# Patient Record
Sex: Female | Born: 1950 | Race: Black or African American | Hispanic: No | Marital: Married | Smoking: Never smoker
Health system: Southern US, Community
[De-identification: ages and names within clinical notes are randomized; demographics above are authoritative.]

## PROBLEM LIST (undated history)

## (undated) DIAGNOSIS — I10 Essential (primary) hypertension: Secondary | ICD-10-CM

---

## 1999-09-05 ENCOUNTER — Emergency Department (HOSPITAL_COMMUNITY): Admission: EM | Admit: 1999-09-05 | Discharge: 1999-09-05 | Payer: Self-pay | Admitting: Emergency Medicine

## 1999-09-05 ENCOUNTER — Encounter: Payer: Self-pay | Admitting: Emergency Medicine

## 2016-09-05 ENCOUNTER — Ambulatory Visit (INDEPENDENT_AMBULATORY_CARE_PROVIDER_SITE_OTHER): Payer: Self-pay | Admitting: Physician Assistant

## 2016-09-05 VITALS — BP 150/82 | HR 72 | Temp 99.8°F | Resp 17 | Ht 65.0 in | Wt 195.0 lb

## 2016-09-05 DIAGNOSIS — Z13220 Encounter for screening for lipoid disorders: Secondary | ICD-10-CM

## 2016-09-05 DIAGNOSIS — I10 Essential (primary) hypertension: Secondary | ICD-10-CM

## 2016-09-05 LAB — POCT CBC
Granulocyte percent: 45.5 %G (ref 37–80)
HCT, POC: 35.5 % — AB (ref 37.7–47.9)
Hemoglobin: 12.6 g/dL (ref 12.2–16.2)
Lymph, poc: 3.3 (ref 0.6–3.4)
MCH, POC: 32.1 pg — AB (ref 27–31.2)
MCHC: 35.5 g/dL — AB (ref 31.8–35.4)
MCV: 90.4 fL (ref 80–97)
MID (cbc): 0.4 (ref 0–0.9)
MPV: 7.9 fL (ref 0–99.8)
POC Granulocyte: 3.1 (ref 2–6.9)
POC LYMPH PERCENT: 48.7 %L (ref 10–50)
POC MID %: 5.8 %M (ref 0–12)
Platelet Count, POC: 199 10*3/uL (ref 142–424)
RBC: 3.93 M/uL — AB (ref 4.04–5.48)
RDW, POC: 13.8 %
WBC: 6.8 10*3/uL (ref 4.6–10.2)

## 2016-09-05 LAB — COMPLETE METABOLIC PANEL WITH GFR
ALT: 34 U/L — ABNORMAL HIGH (ref 6–29)
AST: 35 U/L (ref 10–35)
Albumin: 4.1 g/dL (ref 3.6–5.1)
Alkaline Phosphatase: 56 U/L (ref 33–130)
BUN: 14 mg/dL (ref 7–25)
CO2: 31 mmol/L (ref 20–31)
Calcium: 9.4 mg/dL (ref 8.6–10.4)
Chloride: 103 mmol/L (ref 98–110)
Creat: 0.93 mg/dL (ref 0.50–0.99)
GFR, Est African American: 74 mL/min (ref >=60)
GFR, Est Non African American: 64 mL/min (ref >=60)
Glucose, Bld: 97 mg/dL (ref 65–99)
Potassium: 3.3 mmol/L — ABNORMAL LOW (ref 3.5–5.3)
Sodium: 141 mmol/L (ref 135–146)
Total Bilirubin: 0.5 mg/dL (ref 0.2–1.2)
Total Protein: 7.2 g/dL (ref 6.1–8.1)

## 2016-09-05 LAB — LIPID PANEL
Cholesterol: 200 mg/dL — ABNORMAL HIGH (ref <200)
HDL: 51 mg/dL (ref >50)
LDL Cholesterol: 136 mg/dL — ABNORMAL HIGH (ref <100)
Total CHOL/HDL Ratio: 3.9 Ratio (ref <5.0)
Triglycerides: 63 mg/dL (ref <150)
VLDL: 13 mg/dL (ref <30)

## 2016-09-05 LAB — TSH: TSH: 3.37 mIU/L

## 2016-09-05 MED ORDER — LISINOPRIL-HYDROCHLOROTHIAZIDE 20-12.5 MG PO TABS: 2.0000 | ORAL_TABLET | Freq: Every day | ORAL | 2 refills | Status: DC

## 2016-09-05 MED ORDER — ATENOLOL 50 MG PO TABS: 50.0000 mg | ORAL_TABLET | Freq: Every day | ORAL | 2 refills | Status: DC

## 2016-09-05 NOTE — Patient Instructions (Signed)
     IF you received an x-ray today, you will receive an invoice from [facility]. Please contact [facility] at [phone] with questions or concerns regarding your invoice.   IF you received labwork today, you will receive an invoice from [facility]. Please contact [facility] at [phone] with questions or concerns regarding your invoice.   Our billing staff will not be able to assist you with questions regarding bills from these companies.  You will be contacted with the lab results as soon as they are available. The fastest way to get your results is to activate your My Chart account. Instructions are located on the last page of this paperwork. If you have not heard from us regarding the results in 2 weeks, please contact this office.      

## 2016-09-05 NOTE — Progress Notes (Signed)
09/05/2016 5:39 PM   DOB: [birth year] / MRN: [id number]  SUBJECTIVE:  Darlene [name] is a 65 y.o. female presenting for BP medication.  Reports she has run out of her Lisinopril and reports her PCP, Dr. Jimmye Norman, is no longer seeing patients.    She reports to me today that "my kidney is slowing down" per some lab results that she received at the weight loss clinic.    She is taking Atenolol now for about 1-2 years for HTN along with Prinzide.   She is not a diabetic, denies a history of CAD and stroke. She has been taking Niacin at the recommendation of Dr. Jimmye Norman and she is amenable to stopping.    She has no allergies on file.   She  has no past medical history on file.    She  reports that she has never smoked. She does not have any smokeless tobacco history on file. She  has no sexual activity history on file. The patient  has no past surgical history on file.  Her family history is not on file.  Review of Systems  Respiratory: Negative for cough.   Cardiovascular: Negative for chest pain and leg swelling.  Skin: Negative for rash.  Neurological: Negative for dizziness and headaches.    The problem list and medications were reviewed and updated by myself where necessary and exist elsewhere in the encounter.   OBJECTIVE:  BP (!) 150/82 (BP Location: Right Arm, Patient Position: Sitting, Cuff Size: Large)   Pulse 72   Temp 99.8 F (37.7 C) (Oral)   Resp 17   Ht 5\' 5"  (1.651 m)   Wt 195 lb (88.5 kg)   SpO2 90%   BMI 32.45 kg/m   Physical Exam  Constitutional: She is oriented to person, place, and time. She appears well-nourished. No distress.  Eyes: EOM are normal. Pupils are equal, round, and reactive to light.  Cardiovascular: Normal rate and regular rhythm.   Pulmonary/Chest: Effort normal and breath sounds normal.  Abdominal: She exhibits no distension.  Neurological: She is alert and oriented to person, place, and time. No cranial nerve deficit. Gait normal.    Skin: Skin is dry. She is not diaphoretic.  Psychiatric: She has a normal mood and affect.  Vitals reviewed.   Results for orders placed or performed in visit on 09/05/16 (from the past 72 hour(s))  POCT CBC     Status: Abnormal   Collection Time: 09/05/16  5:35 PM  Result Value Ref Range   WBC 6.8 4.6 - 10.2 K/uL   Lymph, poc 3.3 0.6 - 3.4   POC LYMPH PERCENT 48.7 10 - 50 %L   MID (cbc) 0.4 0 - 0.9   POC MID % 5.8 0 - 12 %M   POC Granulocyte 3.1 2 - 6.9   Granulocyte percent 45.5 37 - 80 %G   RBC 3.93 (A) 4.04 - 5.48 M/uL   Hemoglobin 12.6 12.2 - 16.2 g/dL   HCT, POC 35.5 (A) 37.7 - 47.9 %   MCV 90.4 80 - 97 fL   MCH, POC 32.1 (A) 27 - 31.2 pg   MCHC 35.5 (A) 31.8 - 35.4 g/dL   RDW, POC 13.8 %   Platelet Count, POC 199 142 - 424 K/uL   MPV 7.9 0 - 99.8 fL    No results found.  ASSESSMENT AND PLAN  Izell Breda was seen today for medication refill.  Diagnoses and all orders for this visit:  Essential hypertension Comments: Well  appearing female here today for med refills and to establish. I am stopping her Niacin due to risk of stroke. Will quantify her lipids. PE with me in June. Orders: -     lisinopril-hydrochlorothiazide (ZESTORETIC) 20-12.5 MG tablet; Take 2 tablets by mouth daily. -     atenolol (TENORMIN) 50 MG tablet; Take 1 tablet (50 mg total) by mouth daily. -     POCT CBC -     TSH -     COMPLETE METABOLIC PANEL WITH GFR  Screening for lipid disorders -     Lipid panel    The patient is advised to call or return to clinic if she does not see an improvement in symptoms, or to seek the care of the closest emergency department if she worsens with the above plan.   Philis Fendt, MHS, PA-C [facility] 09/05/2016 5:39 PM

## 2016-09-16 ENCOUNTER — Encounter: Payer: Self-pay | Admitting: Physician Assistant

## 2016-10-06 ENCOUNTER — Encounter: Payer: Self-pay | Admitting: Physician Assistant

## 2017-03-10 ENCOUNTER — Encounter: Payer: Self-pay | Admitting: Physician Assistant

## 2017-03-10 ENCOUNTER — Ambulatory Visit (INDEPENDENT_AMBULATORY_CARE_PROVIDER_SITE_OTHER): Payer: Medicare Other | Admitting: Physician Assistant

## 2017-03-10 ENCOUNTER — Ambulatory Visit (INDEPENDENT_AMBULATORY_CARE_PROVIDER_SITE_OTHER): Payer: Medicare Other

## 2017-03-10 VITALS — BP 155/85 | HR 102 | Temp 98.8°F | Resp 17 | Ht 67.5 in | Wt 185.0 lb

## 2017-03-10 DIAGNOSIS — Z131 Encounter for screening for diabetes mellitus: Secondary | ICD-10-CM

## 2017-03-10 DIAGNOSIS — I10 Essential (primary) hypertension: Secondary | ICD-10-CM | POA: Diagnosis not present

## 2017-03-10 DIAGNOSIS — M5442 Lumbago with sciatica, left side: Secondary | ICD-10-CM

## 2017-03-10 DIAGNOSIS — M47816 Spondylosis without myelopathy or radiculopathy, lumbar region: Secondary | ICD-10-CM | POA: Diagnosis not present

## 2017-03-10 LAB — POCT GLYCOSYLATED HEMOGLOBIN (HGB A1C): HEMOGLOBIN A1C: 5.7

## 2017-03-10 IMAGING — DX DG LUMBAR SPINE 2-3V
3 series · 3 of 3 positions shown · non-contrast
Comparison: None.

CLINICAL DATA: Low back and lumbar spine pain radiating into left
leg for 3 days.

EXAM:
LUMBAR SPINE - 2-3 VIEW

[l-spine ap]
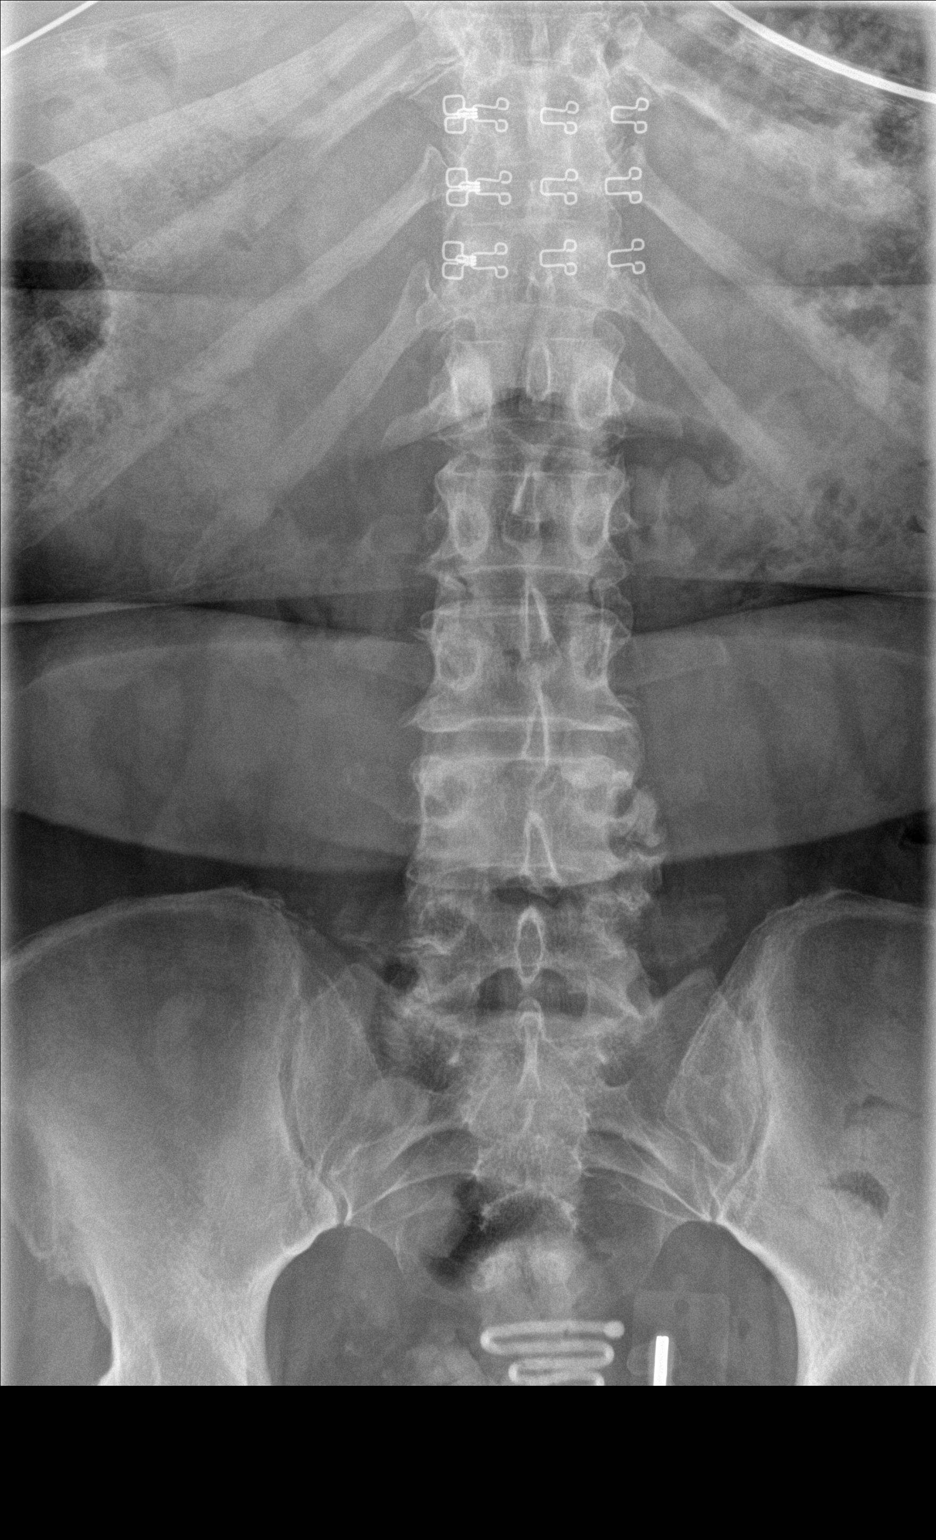

[l-spine lat]
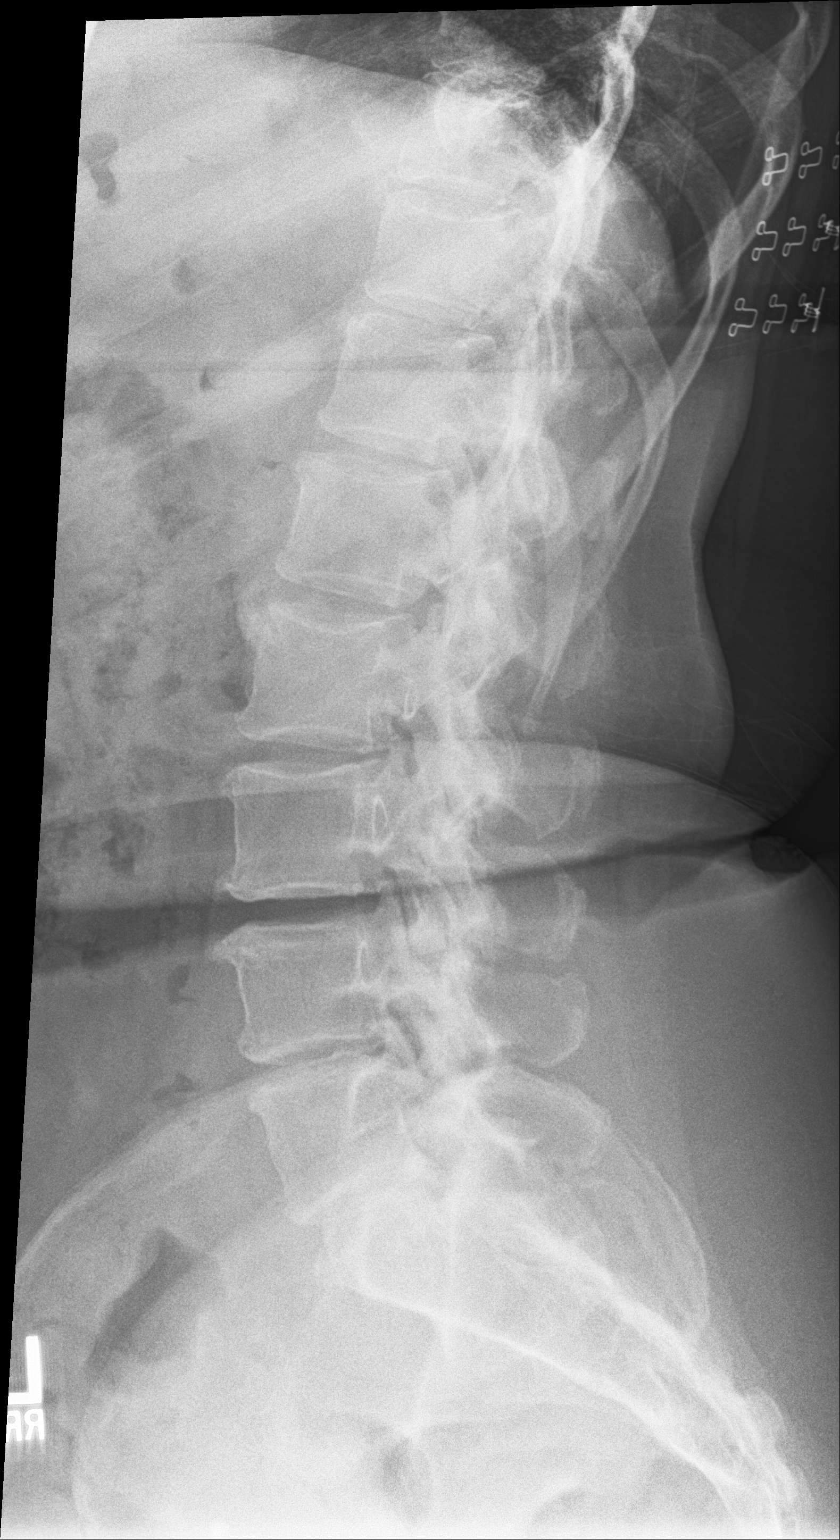

[l-spine l5-s1]
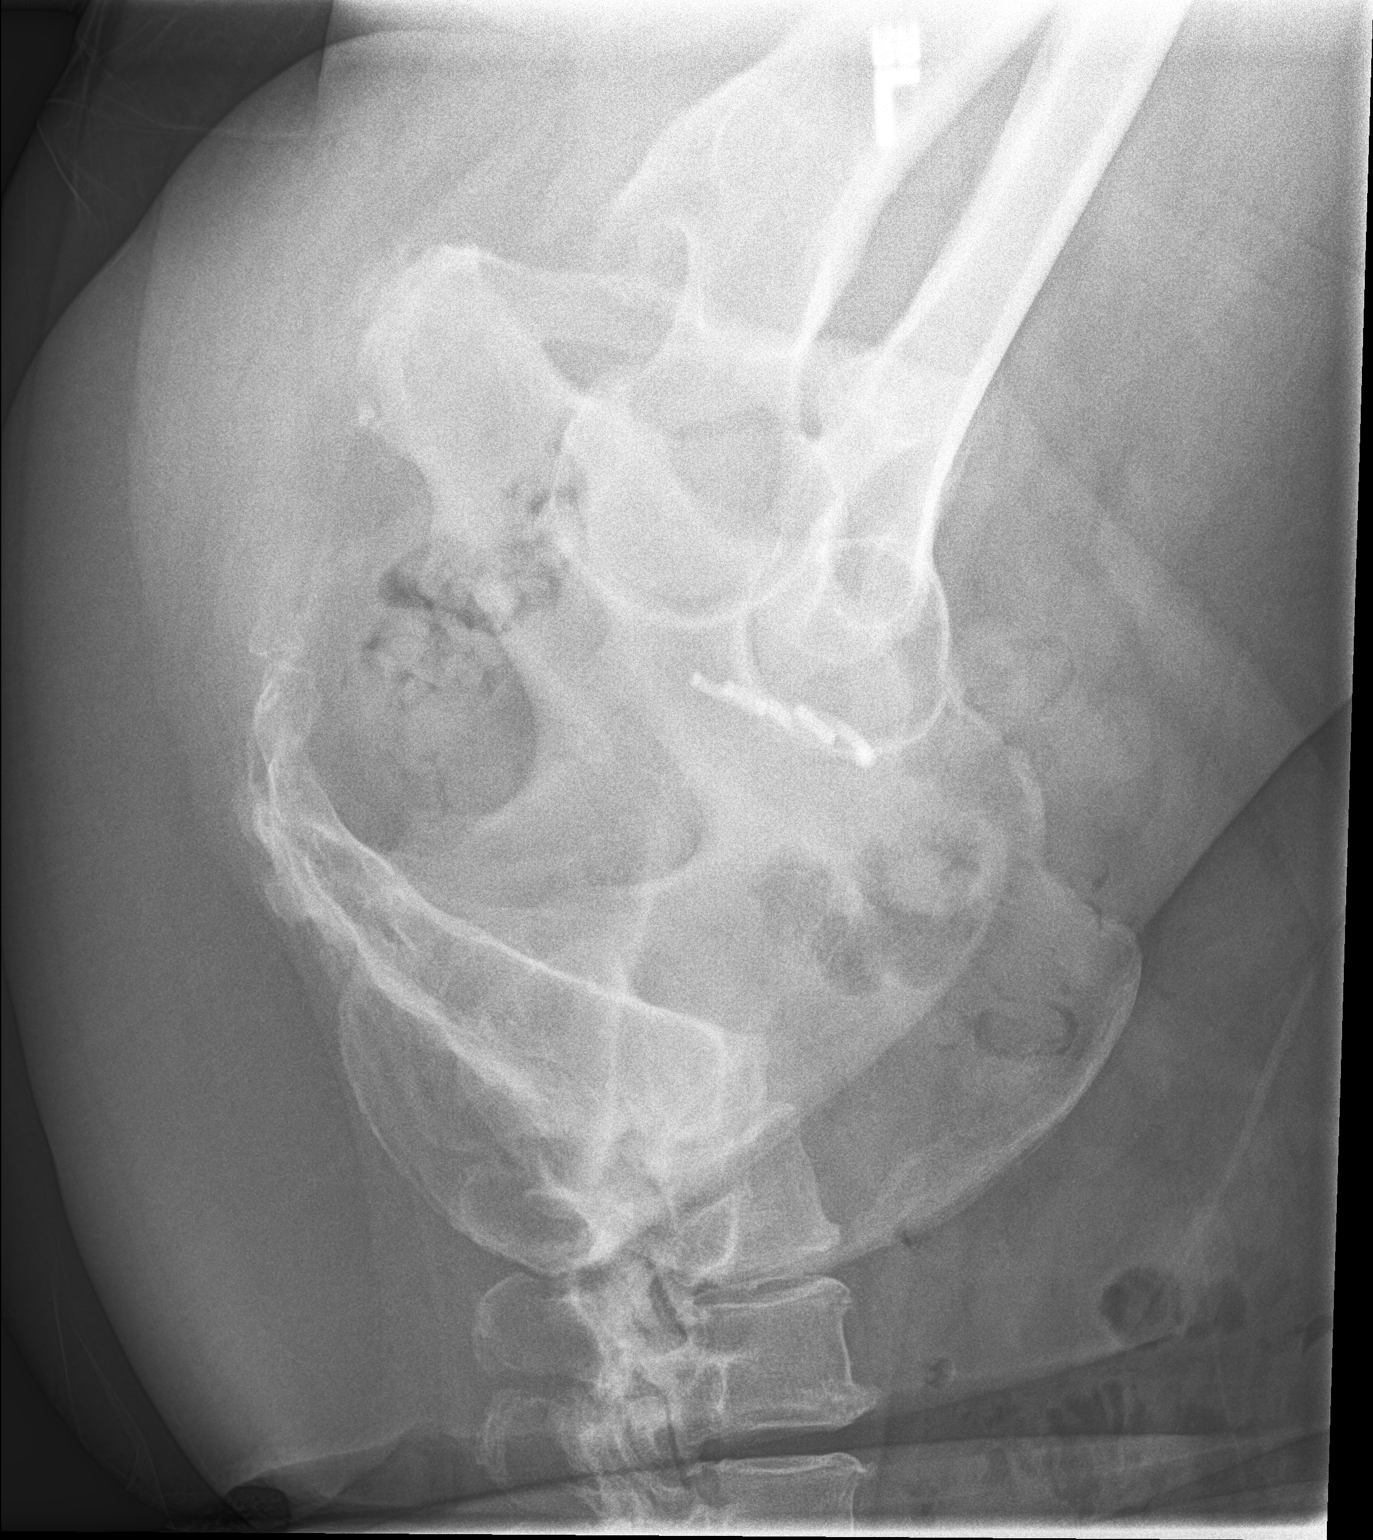

[3 of 3 positions shown; findings below may reference images not displayed]

FINDINGS: No fracture or subluxation identified.

Mild to moderate degenerative disc disease, spondylosis and facet
arthropathy at L4-5 and L5-S1 noted with mild multilevel
degenerative disc disease noted throughout the remainder of the
lumbar spine.

No focal bony lesions identified.

An IUD is noted.
IMPRESSION: No evidence of acute abnormality.

Multilevel degenerative changes, mild to moderate at L4-5 and L5-S1.

## 2017-03-10 MED ORDER — CYCLOBENZAPRINE HCL 10 MG PO TABS: 5.0000 mg | ORAL_TABLET | Freq: Three times a day (TID) | ORAL | 0 refills | Status: DC | PRN

## 2017-03-10 MED ORDER — ATENOLOL 50 MG PO TABS: 50.0000 mg | ORAL_TABLET | Freq: Every day | ORAL | 2 refills | Status: DC

## 2017-03-10 MED ORDER — PREDNISONE 20 MG PO TABS: ORAL_TABLET | ORAL | 0 refills | Status: AC

## 2017-03-10 MED ORDER — LISINOPRIL-HYDROCHLOROTHIAZIDE 20-12.5 MG PO TABS: 2.0000 | ORAL_TABLET | Freq: Every day | ORAL | 2 refills | Status: DC

## 2017-03-10 NOTE — Progress Notes (Signed)
03/13/2017 9:59 AM   DOB: [birth year] / MRN: [id number]  SUBJECTIVE:  Darlene [name] is a 66 y.o. female presenting for left sided pain that started on Monday, remissed, then came back on Wednesday.  Describes the pain as throbbing and will not go away.  Sitting makes the pain worse.  Bending makes the pain worse.  Lying down seems to help the pain. She has tried Apercream without relief.  She has tried Aleve as well without relief. Denies a history of back pain.  She is urinating normally for her and denies dysuria or hematuria. Denies paresthesia.  Denies a history of diabetes but has had prediabetes in the past.   She denies injury to the back.   Tells me her BP has largely been okay but she  She has no allergies on file.   She  has no past medical history on file.    She  reports that she has never smoked. She has never used smokeless tobacco. She reports that she does not drink alcohol or use drugs. She  reports that she does not engage in sexual activity. The patient  has no past surgical history on file.  Her family history is not on file.  Review of Systems  Constitutional: Negative for chills and fever.  Respiratory: Negative for cough.   Cardiovascular: Negative for chest pain.  Gastrointestinal: Negative for nausea.  Genitourinary: Negative for dysuria, frequency, hematuria and urgency.  Musculoskeletal: Positive for back pain. Negative for falls, joint pain, myalgias and neck pain.  Skin: Negative for itching and rash.  Neurological: Negative for dizziness.  Psychiatric/Behavioral: Negative for depression.    The problem list and medications were reviewed and updated by myself where necessary and exist elsewhere in the encounter.   OBJECTIVE:  BP (!) 155/85   Pulse (!) 102   Temp 98.8 F (37.1 C) (Oral)   Resp 17   Ht 5' 7.5" (1.715 m)   Wt 185 lb (83.9 kg)   SpO2 97%   BMI 28.55 kg/m    Physical Exam  Constitutional: She is oriented to person, place, and time. She  is active.  Non-toxic appearance.  HENT:  Right Ear: Hearing, tympanic membrane, external ear and ear canal normal.  Left Ear: Hearing, tympanic membrane, external ear and ear canal normal.  Nose: Nose normal. Right sinus exhibits no maxillary sinus tenderness and no frontal sinus tenderness. Left sinus exhibits no maxillary sinus tenderness and no frontal sinus tenderness.  Mouth/Throat: Uvula is midline, oropharynx is clear and moist and mucous membranes are normal. Mucous membranes are not dry. No oropharyngeal exudate, posterior oropharyngeal edema or tonsillar abscesses.  Eyes: EOM are normal. Pupils are equal, round, and reactive to light.  Cardiovascular: Normal rate, regular rhythm, S1 normal, S2 normal, normal heart sounds and intact distal pulses.  Exam reveals no gallop, no friction rub and no decreased pulses.   No murmur heard. Pulmonary/Chest: Effort normal. No stridor. No tachypnea. No respiratory distress. She has no wheezes. She has no rales.  Abdominal: She exhibits no distension.  Musculoskeletal: Normal range of motion. She exhibits no edema or deformity.       Lumbar back: She exhibits tenderness and pain. She exhibits normal range of motion, no bony tenderness, no swelling, no edema, no deformity, no laceration, no spasm and normal pulse.  Lymphadenopathy:       Head (right side): No submandibular and no tonsillar adenopathy present.       Head (left side): No submandibular and  no tonsillar adenopathy present.    She has no cervical adenopathy.  Neurological: She is alert and oriented to person, place, and time. She has normal strength and normal reflexes. She is not disoriented. She displays no atrophy. No cranial nerve deficit or sensory deficit. She exhibits normal muscle tone. Coordination and gait normal.  Skin: Skin is warm and dry. She is not diaphoretic. No pallor.  Psychiatric: Her behavior is normal.   Pulse Readings from Last 3 Encounters:  03/10/17 (!) 102   09/05/16 72   Results for orders placed or performed in visit on 03/10/17 (from the past 72 hour(s))  POCT glycosylated hemoglobin (Hb A1C)     Status: None   Collection Time: 03/10/17 12:30 PM  Result Value Ref Range   Hemoglobin A1C 5.7     No results found.  ASSESSMENT AND PLAN:  Darlene [name] was seen today for hip pain and leg pain.  Diagnoses and all orders for this visit:  Acute left-sided low back pain with left-sided sciatica: Given radicular symptoms starting pred.  Flexeril should be helpful in the short term.  -     DG Lumbar Spine 2-3 Views; Future  Essential hypertension: She will start back on her medication.  Orders: -     atenolol (TENORMIN) 50 MG tablet; Take 1 tablet (50 mg total) by mouth daily. -     lisinopril-hydrochlorothiazide (ZESTORETIC) 20-12.5 MG tablet; Take 2 tablets by mouth daily. -     POCT glycosylated hemoglobin (Hb A1C)  Screening for diabetes mellitus -     POCT glycosylated hemoglobin (Hb A1C)    The patient is advised to call or return to clinic if she does not see an improvement in symptoms, or to seek the care of the closest emergency department if she worsens with the above plan.   Philis Fendt, MHS, PA-C [facility] 03/13/2017 9:59 AM

## 2017-03-10 NOTE — Patient Instructions (Addendum)
     IF you received an x-ray today, you will receive an invoice from [facility]. Please contact [facility] at [phone] with questions or concerns regarding your invoice.   IF you received labwork today, you will receive an invoice from [facility]. Please contact [facility] at [phone] with questions or concerns regarding your invoice.   Our billing staff will not be able to assist you with questions regarding bills from these companies.  You will be contacted with the lab results as soon as they are available. The fastest way to get your results is to activate your My Chart account. Instructions are located on the last page of this paperwork. If you have not heard from us regarding the results in 2 weeks, please contact this office.    We recommend that you schedule a mammogram for breast cancer screening. Typically, you do not need a referral to do this. Please contact a local imaging center to schedule your mammogram.  [facility] - [phone]  *ask for the Radiology Department [facility] - [phone] or [phone]  [facility] - [phone] [facility] - [phone] [facility] - [phone]  *ask for the Radiology Department [facility] - [phone]  *ask for the Radiology Department [facility] - [phone]  *ask for the Mammography Department [facility] - [phone] 

## 2017-03-21 ENCOUNTER — Telehealth: Payer: Self-pay | Admitting: Physician Assistant

## 2017-03-21 NOTE — Telephone Encounter (Signed)
Patient needs FMLA forms completed by Legrand Como for left side pain. I have completed what I could from the notes and highlighted the areas I was not sure about I will place the forms in your box on 03/21/17 please return them to the FMLA/Disability box at the 102 checkout desk within 5-7 business days.  I wasn't sure about time off work because it doesn't look like you wrote her out so that's why there is so much highlighted.

## 2017-03-22 ENCOUNTER — Ambulatory Visit (INDEPENDENT_AMBULATORY_CARE_PROVIDER_SITE_OTHER): Payer: Medicare Other | Admitting: Physician Assistant

## 2017-03-22 ENCOUNTER — Encounter: Payer: Self-pay | Admitting: Physician Assistant

## 2017-03-22 VITALS — BP 134/82 | HR 53 | Temp 98.7°F | Resp 16 | Ht 67.5 in | Wt 192.0 lb

## 2017-03-22 DIAGNOSIS — Z1239 Encounter for other screening for malignant neoplasm of breast: Secondary | ICD-10-CM

## 2017-03-22 DIAGNOSIS — Z1231 Encounter for screening mammogram for malignant neoplasm of breast: Secondary | ICD-10-CM

## 2017-03-22 DIAGNOSIS — Z1211 Encounter for screening for malignant neoplasm of colon: Secondary | ICD-10-CM | POA: Diagnosis not present

## 2017-03-22 DIAGNOSIS — M5442 Lumbago with sciatica, left side: Secondary | ICD-10-CM | POA: Diagnosis not present

## 2017-03-22 MED ORDER — ACETAMINOPHEN 500 MG PO TABS: 1000.0000 mg | ORAL_TABLET | Freq: Three times a day (TID) | ORAL | 99 refills | Status: AC | PRN

## 2017-03-22 MED ORDER — TRAMADOL HCL 50 MG PO TABS: 50.0000 mg | ORAL_TABLET | Freq: Three times a day (TID) | ORAL | 0 refills | Status: DC | PRN

## 2017-03-22 NOTE — Progress Notes (Signed)
03/23/2017 10:47 AM   DOB: [birth year] / MRN: [id number]  SUBJECTIVE:  Darlene [name] is a 66 y.o. female presenting for recheck of back pain.  Tells me that her pain is constant and does radiate down the left side.  Tells me she got good relief "but not comfort" with the prednisone. The flexeril did help some. She works for [employer] as a Scientist, water quality part time and has not been able to work since this pain.   She does check her pressure at home and tells me this measures roughly 563 systolic and she does not remember the bottom number. She refuses the pneumonia shot today.  She tells me she will not take the flu shot either.     She has No Known Allergies.   She  has no past medical history on file.    She  reports that she has never smoked. She has never used smokeless tobacco. She reports that she does not drink alcohol or use drugs. She  reports that she does not engage in sexual activity. The patient  has no past surgical history on file.  Her family history is not on file.  Review of Systems  Respiratory: Negative for cough and shortness of breath.   Cardiovascular: Negative for chest pain, orthopnea, leg swelling and PND.  Gastrointestinal: Negative for abdominal pain.  Genitourinary: Negative for dysuria, flank pain, frequency and urgency.  Musculoskeletal: Positive for back pain and myalgias. Negative for falls, joint pain and neck pain.  Neurological: Negative for dizziness.    The problem list and medications were reviewed and updated by myself where necessary and exist elsewhere in the encounter.   OBJECTIVE:  BP 134/82   Pulse (!) 53   Temp 98.7 F (37.1 C) (Oral)   Resp 16   Ht 5' 7.5" (1.715 m)   Wt 192 lb (87.1 kg)   SpO2 98%   BMI 29.63 kg/m   Physical Exam  Constitutional: She is oriented to person, place, and time. She is active.  Non-toxic appearance.  Cardiovascular: Normal rate and regular rhythm.   Pulmonary/Chest: Effort normal and breath sounds normal. No  tachypnea.  Neurological: She is alert and oriented to person, place, and time.  Skin: Skin is warm and dry. She is not diaphoretic. No pallor.    Lab Results  Component Value Date   CREATININE 0.93 09/05/2016   Lab Results  Component Value Date   HGBA1C 5.7 03/10/2017   Lab Results  Component Value Date   WBC 6.8 09/05/2016   HGB 12.6 09/05/2016   HCT 35.5 (A) 09/05/2016   MCV 90.4 09/05/2016   Lab Results  Component Value Date   ALT 34 (H) 09/05/2016   AST 35 09/05/2016   ALKPHOS 56 09/05/2016   BILITOT 0.5 09/05/2016   Lab Results  Component Value Date   CHOL 200 (H) 09/05/2016   HDL 51 09/05/2016   LDLCALC 136 (H) 09/05/2016   TRIG 63 09/05/2016   CHOLHDL 3.9 09/05/2016        No results found for this or any previous visit (from the past 72 hour(s)).  No results found.  ASSESSMENT AND PLAN:  Izell [place] was seen today for back pain.  Diagnoses and all orders for this visit:  Acute left-sided low back pain with left-sided sciatica: Advise PT and tylenol and ultram for break through severe pain.  Long discussion with regard to incapacitating pain versus functional pain.  PT referral pending.   -     acetaminophen (  TYLENOL) 500 MG tablet; Take 2 tablets (1,000 mg total) by mouth every 8 (eight) hours as needed for mild pain, moderate pain or fever. Take 2 tabs every 8 hours. -     traMADol (ULTRAM) 50 MG tablet; Take 1 tablet (50 mg total) by mouth every 8 (eight) hours as needed. -     Ambulatory referral to Physical Therapy  Special screening for malignant neoplasms, colon: Catching up on her HCM.  Will do a PAP next time she is here.  -     Cologuard  Screening breast examination -     MM DIGITAL SCREENING BILATERAL; Future    The patient is advised to call or return to clinic if she does not see an improvement in symptoms, or to seek the care of the closest emergency department if she worsens with the above plan.   Philis Fendt, MHS, PA-C [facility] 03/23/2017 10:47 AM

## 2017-03-22 NOTE — Patient Instructions (Addendum)
     IF you received an x-ray today, you will receive an invoice from [facility]. Please contact [facility] at [phone] with questions or concerns regarding your invoice.   IF you received labwork today, you will receive an invoice from [facility]. Please contact [facility] at [phone] with questions or concerns regarding your invoice.   Our billing staff will not be able to assist you with questions regarding bills from these companies.  You will be contacted with the lab results as soon as they are available. The fastest way to get your results is to activate your My Chart account. Instructions are located on the last page of this paperwork. If you have not heard from us regarding the results in 2 weeks, please contact this office.     

## 2017-03-30 ENCOUNTER — Other Ambulatory Visit: Payer: Self-pay | Admitting: Physician Assistant

## 2017-03-30 NOTE — Telephone Encounter (Signed)
Paperwork scanned and faxed to [place] on 03/30/17

## 2017-03-30 NOTE — Telephone Encounter (Signed)
Will do first thing at 130 then put them in your hands for fax. Thank you Caitlyn.

## 2017-03-30 NOTE — Telephone Encounter (Signed)
Today is the 7th business days, have we completed these forms? I am needing them back to fax to the employer.

## 2017-04-07 ENCOUNTER — Encounter: Payer: Self-pay | Admitting: Physician Assistant

## 2017-04-07 ENCOUNTER — Ambulatory Visit (INDEPENDENT_AMBULATORY_CARE_PROVIDER_SITE_OTHER): Payer: Medicare Other | Admitting: Physician Assistant

## 2017-04-07 DIAGNOSIS — Z029 Encounter for administrative examinations, unspecified: Secondary | ICD-10-CM

## 2017-04-07 NOTE — Progress Notes (Signed)
Patient needing more time off for her back.  I have written her out until July first and adjusted her FMLA to reflect this. Advised that given her back pain is not following the normal course she would benefit from ortho however she does not want this.  She has not pursued PT yet either.  Philis Fendt, MS, PA-C 12:27 PM, 04/07/2017

## 2017-04-07 NOTE — Patient Instructions (Signed)
     IF you received an x-ray today, you will receive an invoice from [facility]. Please contact [facility] at [phone] with questions or concerns regarding your invoice.   IF you received labwork today, you will receive an invoice from [facility]. Please contact [facility] at [phone] with questions or concerns regarding your invoice.   Our billing staff will not be able to assist you with questions regarding bills from these companies.  You will be contacted with the lab results as soon as they are available. The fastest way to get your results is to activate your My Chart account. Instructions are located on the last page of this paperwork. If you have not heard from us regarding the results in 2 weeks, please contact this office.    We recommend that you schedule a mammogram for breast cancer screening. Typically, you do not need a referral to do this. Please contact a local imaging center to schedule your mammogram.  [facility] - [phone]  *ask for the Radiology Department [facility]) - [phone] or [phone]  [facility] - [phone] [facility] - [phone] [facility] - [phone]  *ask for the Radiology Department [facility] - [phone]  *ask for the Radiology Department [facility] - [phone]  *ask for the Mammography Department [facility] - [phone] 

## 2017-04-27 ENCOUNTER — Telehealth: Payer: Self-pay | Admitting: Physical Therapy

## 2017-06-06 ENCOUNTER — Telehealth: Payer: Self-pay

## 2017-06-06 NOTE — Telephone Encounter (Signed)
Called pt to schedule Medicare Annual Wellness Visit with nurse health advisor. -nr  

## 2018-05-26 ENCOUNTER — Other Ambulatory Visit: Payer: Self-pay | Admitting: Physician Assistant

## 2018-07-11 ENCOUNTER — Encounter: Payer: Self-pay | Admitting: Physician Assistant

## 2018-07-20 ENCOUNTER — Ambulatory Visit: Payer: Medicare Other | Admitting: Emergency Medicine

## 2018-07-23 ENCOUNTER — Ambulatory Visit: Payer: Medicare Other | Admitting: Emergency Medicine

## 2018-07-23 ENCOUNTER — Encounter: Payer: Self-pay | Admitting: Emergency Medicine

## 2018-08-17 ENCOUNTER — Encounter: Payer: Self-pay | Admitting: Emergency Medicine

## 2018-08-17 ENCOUNTER — Ambulatory Visit (INDEPENDENT_AMBULATORY_CARE_PROVIDER_SITE_OTHER): Payer: Medicare Other | Admitting: Emergency Medicine

## 2018-08-17 ENCOUNTER — Other Ambulatory Visit: Payer: Self-pay

## 2018-08-17 VITALS — BP 159/72 | HR 59 | Temp 98.3°F | Resp 16 | Ht 64.75 in | Wt 186.4 lb

## 2018-08-17 DIAGNOSIS — I10 Essential (primary) hypertension: Secondary | ICD-10-CM | POA: Insufficient documentation

## 2018-08-17 DIAGNOSIS — Z1211 Encounter for screening for malignant neoplasm of colon: Secondary | ICD-10-CM | POA: Diagnosis not present

## 2018-08-17 MED ORDER — LISINOPRIL-HYDROCHLOROTHIAZIDE 20-12.5 MG PO TABS: 2.0000 | ORAL_TABLET | Freq: Every day | ORAL | 3 refills | Status: DC

## 2018-08-17 MED ORDER — ATENOLOL 50 MG PO TABS: 50.0000 mg | ORAL_TABLET | Freq: Every day | ORAL | 3 refills | Status: DC

## 2018-08-17 NOTE — Patient Instructions (Addendum)
   If you have lab work done today you will be contacted with your lab results within the next 2 weeks.  If you have not heard from us then please contact us. The fastest way to get your results is to register for My Chart.   IF you received an x-ray today, you will receive an invoice from [facility]. Please contact [facility] at [phone] with questions or concerns regarding your invoice.   IF you received labwork today, you will receive an invoice from [facility]. Please contact [facility] at [phone] with questions or concerns regarding your invoice.   Our billing staff will not be able to assist you with questions regarding bills from these companies.  You will be contacted with the lab results as soon as they are available. The fastest way to get your results is to activate your My Chart account. Instructions are located on the last page of this paperwork. If you have not heard from us regarding the results in 2 weeks, please contact this office.     Hypertension Hypertension, commonly called high blood pressure, is when the force of blood pumping through the arteries is too strong. The arteries are the blood vessels that carry blood from the heart throughout the body. Hypertension forces the heart to work harder to pump blood and may cause arteries to become narrow or stiff. Having untreated or uncontrolled hypertension can cause heart attacks, strokes, kidney disease, and other problems. A blood pressure reading consists of a higher number over a lower number. Ideally, your blood pressure should be below 120/80. The first ("top") number is called the systolic pressure. It is a measure of the pressure in your arteries as your heart beats. The second ("bottom") number is called the diastolic pressure. It is a measure of the pressure in your arteries as the heart relaxes. What are the causes? The cause of this condition is not known. What increases the risk? Some  risk factors for high blood pressure are under your control. Others are not. Factors you can change  Smoking.  Having type 2 diabetes mellitus, high cholesterol, or both.  Not getting enough exercise or physical activity.  Being overweight.  Having too much fat, sugar, calories, or salt (sodium) in your diet.  Drinking too much alcohol. Factors that are difficult or impossible to change  Having chronic kidney disease.  Having a family history of high blood pressure.  Age. Risk increases with age.  Race. You may be at higher risk if you are African-American.  Gender. Men are at higher risk than women before age 45. After age 65, women are at higher risk than men.  Having obstructive sleep apnea.  Stress. What are the signs or symptoms? Extremely high blood pressure (hypertensive crisis) may cause:  Headache.  Anxiety.  Shortness of breath.  Nosebleed.  Nausea and vomiting.  Severe chest pain.  Jerky movements you cannot control (seizures).  How is this diagnosed? This condition is diagnosed by measuring your blood pressure while you are seated, with your arm resting on a surface. The cuff of the blood pressure monitor will be placed directly against the skin of your upper arm at the level of your heart. It should be measured at least twice using the same arm. Certain conditions can cause a difference in blood pressure between your right and left arms. Certain factors can cause blood pressure readings to be lower or higher than normal (elevated) for a short period of time:  When   your blood pressure is higher when you are in a health care provider's office than when you are at home, this is called white coat hypertension. Most people with this condition do not need medicines.  When your blood pressure is higher at home than when you are in a health care provider's office, this is called masked hypertension. Most people with this condition may need medicines to control  blood pressure.  If you have a high blood pressure reading during one visit or you have normal blood pressure with other risk factors:  You may be asked to return on a different day to have your blood pressure checked again.  You may be asked to monitor your blood pressure at home for 1 week or longer.  If you are diagnosed with hypertension, you may have other blood or imaging tests to help your health care provider understand your overall risk for other conditions. How is this treated? This condition is treated by making healthy lifestyle changes, such as eating healthy foods, exercising more, and reducing your alcohol intake. Your health care provider may prescribe medicine if lifestyle changes are not enough to get your blood pressure under control, and if:  Your systolic blood pressure is above 130.  Your diastolic blood pressure is above 80.  Your personal target blood pressure may vary depending on your medical conditions, your age, and other factors. Follow these instructions at home: Eating and drinking  Eat a diet that is high in fiber and potassium, and low in sodium, added sugar, and fat. An example eating plan is called the DASH (Dietary Approaches to Stop Hypertension) diet. To eat this way: ? Eat plenty of fresh fruits and vegetables. Try to fill half of your plate at each meal with fruits and vegetables. ? Eat whole grains, such as whole wheat pasta, brown rice, or whole grain bread. Fill about one quarter of your plate with whole grains. ? Eat or drink low-fat dairy products, such as skim milk or low-fat yogurt. ? Avoid fatty cuts of meat, processed or cured meats, and poultry with skin. Fill about one quarter of your plate with lean proteins, such as fish, chicken without skin, beans, eggs, and tofu. ? Avoid premade and processed foods. These tend to be higher in sodium, added sugar, and fat.  Reduce your daily sodium intake. Most people with hypertension should eat less  than 1,500 mg of sodium a day.  Limit alcohol intake to no more than 1 drink a day for nonpregnant women and 2 drinks a day for men. One drink equals 12 oz of beer, 5 oz of wine, or 1 oz of hard liquor. Lifestyle  Work with your health care provider to maintain a healthy body weight or to lose weight. Ask what an ideal weight is for you.  Get at least 30 minutes of exercise that causes your heart to beat faster (aerobic exercise) most days of the week. Activities may include walking, swimming, or biking.  Include exercise to strengthen your muscles (resistance exercise), such as pilates or lifting weights, as part of your weekly exercise routine. Try to do these types of exercises for 30 minutes at least 3 days a week.  Do not use any products that contain nicotine or tobacco, such as cigarettes and e-cigarettes. If you need help quitting, ask your health care provider.  Monitor your blood pressure at home as told by your health care provider.  Keep all follow-up visits as told by your health care provider.   This is important. Medicines  Take over-the-counter and prescription medicines only as told by your health care provider. Follow directions carefully. Blood pressure medicines must be taken as prescribed.  Do not skip doses of blood pressure medicine. Doing this puts you at risk for problems and can make the medicine less effective.  Ask your health care provider about side effects or reactions to medicines that you should watch for. Contact a health care provider if:  You think you are having a reaction to a medicine you are taking.  You have headaches that keep coming back (recurring).  You feel dizzy.  You have swelling in your ankles.  You have trouble with your vision. Get help right away if:  You develop a severe headache or confusion.  You have unusual weakness or numbness.  You feel faint.  You have severe pain in your chest or abdomen.  You vomit  repeatedly.  You have trouble breathing. Summary  Hypertension is when the force of blood pumping through your arteries is too strong. If this condition is not controlled, it may put you at risk for serious complications.  Your personal target blood pressure may vary depending on your medical conditions, your age, and other factors. For most people, a normal blood pressure is less than 120/80.  Hypertension is treated with lifestyle changes, medicines, or a combination of both. Lifestyle changes include weight loss, eating a healthy, low-sodium diet, exercising more, and limiting alcohol. This information is not intended to replace advice given to you by your health care provider. Make sure you discuss any questions you have with your health care provider. Document Released: 10/10/2005 Document Revised: 09/07/2016 Document Reviewed: 09/07/2016 Elsevier Interactive Patient Education  2018 [employer]vier Inc.  

## 2018-08-17 NOTE — Progress Notes (Signed)
Darlene [name] 67 y.o.   Chief Complaint  Patient presents with  . Establish Care  . Medication Refill    Atenolol and lisionpril-hctz   BP Readings from Last 3 Encounters:  08/17/18 (!) 159/72  03/23/17 134/82  03/10/17 (!) 155/85    HISTORY OF PRESENT ILLNESS: This is a 67 y.o. female here today to establish care with me.  Used to see PA Carlis Abbott.  Has a history of hypertension.  Has not taken her medication for 3 to 4 weeks.  Asymptomatic.  No complaints today.  HPI   Prior to Admission medications   Medication Sig Start Date End Date Taking? Authorizing Provider  atenolol (TENORMIN) 50 MG tablet Take 1 tablet (50 mg total) by mouth daily. 03/10/17  Yes Tereasa Coop, PA-C  lisinopril-hydrochlorothiazide (ZESTORETIC) 20-12.5 MG tablet Take 2 tablets by mouth daily. 03/10/17  Yes Tereasa Coop, PA-C  cyclobenzaprine (FLEXERIL) 10 MG tablet Take 0.5-1 tablets (5-10 mg total) by mouth 3 (three) times daily as needed for muscle spasms. Patient not taking: Reported on 08/17/2018 03/10/17   Tereasa Coop, PA-C  traMADol (ULTRAM) 50 MG tablet Take 1 tablet (50 mg total) by mouth every 8 (eight) hours as needed. Patient not taking: Reported on 08/17/2018 03/22/17   Tereasa Coop, PA-C    No Known Allergies  There are no active problems to display for this patient.   No past medical history on file.    Social History   Socioeconomic History  . Marital status: Married    Spouse name: Not on file  . Number of children: Not on file  . Years of education: Not on file  . Highest education level: Not on file  Occupational History  . Not on file  Social Needs  . Financial resource strain: Not on file  . Food insecurity:    Worry: Not on file    Inability: Not on file  . Transportation needs:    Medical: Not on file    Non-medical: Not on file  Tobacco Use  . Smoking status: Never Smoker  . Smokeless tobacco: Never Used  Substance and Sexual Activity  . Alcohol  use: No  . Drug use: No  . Sexual activity: Never  Lifestyle  . Physical activity:    Days per week: Not on file    Minutes per session: Not on file  . Stress: Not on file  Relationships  . Social connections:    Talks on phone: Not on file    Gets together: Not on file    Attends religious service: Not on file    Active member of club or organization: Not on file    Attends meetings of clubs or organizations: Not on file    Relationship status: Not on file  . Intimate partner violence:    Fear of current or ex partner: Not on file    Emotionally abused: Not on file    Physically abused: Not on file    Forced sexual activity: Not on file  Other Topics Concern  . Not on file  Social History Narrative  . Not on file    No family history on file.   Review of Systems  Constitutional: Negative.  Negative for chills, fever and malaise/fatigue.  HENT: Negative.  Negative for congestion, hearing loss, nosebleeds and sore throat.   Eyes: Negative.  Negative for blurred vision and double vision.  Respiratory: Negative.  Negative for cough and shortness of breath.  Cardiovascular: Negative.  Negative for chest pain and palpitations.  Gastrointestinal: Negative.  Negative for abdominal pain, blood in stool, diarrhea, melena, nausea and vomiting.  Genitourinary: Negative.  Negative for dysuria and hematuria.  Musculoskeletal: Negative.  Negative for myalgias and neck pain.  Skin: Negative.  Negative for rash.  Neurological: Negative.  Negative for dizziness, sensory change, focal weakness and headaches.  Endo/Heme/Allergies: Negative.   All other systems reviewed and are negative.   Vitals:   08/17/18 1007  BP: (!) 159/72  Pulse: (!) 59  Resp: 16  Temp: 98.3 F (36.8 C)  SpO2: 99%    Physical Exam  Constitutional: She is oriented to person, place, and time. She appears well-developed and well-nourished.  HENT:  Head: Normocephalic and atraumatic.  Right Ear: External  ear normal.  Left Ear: External ear normal.  Nose: Nose normal.  Mouth/Throat: Oropharynx is clear and moist.  Eyes: Pupils are equal, round, and reactive to light. Conjunctivae and EOM are normal.  Neck: Normal range of motion. Neck supple. No thyromegaly present.  Cardiovascular: Normal rate, regular rhythm and normal heart sounds.  Pulmonary/Chest: Effort normal and breath sounds normal.  Abdominal: Soft. Bowel sounds are normal. She exhibits no distension. There is no tenderness.  Musculoskeletal: Normal range of motion. She exhibits no edema or tenderness.  Lymphadenopathy:    She has no cervical adenopathy.  Neurological: She is alert and oriented to person, place, and time. No sensory deficit. She exhibits normal muscle tone. Coordination normal.  Skin: Skin is warm and dry. Capillary refill takes less than 2 seconds. No rash noted.  Psychiatric: She has a normal mood and affect. Her behavior is normal.  Vitals reviewed.   A total of 25 minutes was spent in the room with the patient, greater than 50% of which was in counseling/coordination of care regarding hypertension, treatment, medications, prognosis, and need for follow-up.  ASSESSMENT & PLAN:  Marcelina was seen today for establish care and medication refill.  Diagnoses and all orders for this visit:  Essential hypertension -     lisinopril-hydrochlorothiazide (ZESTORETIC) 20-12.5 MG tablet; Take 2 tablets by mouth daily. -     atenolol (TENORMIN) 50 MG tablet; Take 1 tablet (50 mg total) by mouth daily.  Colon cancer screening -     Ambulatory referral to Gastroenterology    Patient Instructions       If you have lab work done today you will be contacted with your lab results within the next 2 weeks.  If you have not heard from [place] then please contact us. The fastest way to get your results is to register for My Chart.   IF you received an x-ray today, you will receive an invoice from [facility].  Please contact [facility] at [phone] with questions or concerns regarding your invoice.   IF you received labwork today, you will receive an invoice from [facility]. Please contact [facility] at [phone] with questions or concerns regarding your invoice.   Our billing staff will not be able to assist you with questions regarding bills from these companies.  You will be contacted with the lab results as soon as they are available. The fastest way to get your results is to activate your My Chart account. Instructions are located on the last page of this paperwork. If you have not heard from [place] regarding the results in 2 weeks, please contact this office.     Hypertension Hypertension, commonly called high blood pressure, is when the force  of blood pumping through the arteries is too strong. The arteries are the blood vessels that carry blood from the heart throughout the body. Hypertension forces the heart to work harder to pump blood and may cause arteries to become narrow or stiff. Having untreated or uncontrolled hypertension can cause heart attacks, strokes, kidney disease, and other problems. A blood pressure reading consists of a higher number over a lower number. Ideally, your blood pressure should be below 120/80. The first ("top") number is called the systolic pressure. It is a measure of the pressure in your arteries as your heart beats. The second ("bottom") number is called the diastolic pressure. It is a measure of the pressure in your arteries as the heart relaxes. What are the causes? The cause of this condition is not known. What increases the risk? Some risk factors for high blood pressure are under your control. Others are not. Factors you can change  Smoking.  Having type 2 diabetes mellitus, high cholesterol, or both.  Not getting enough exercise or physical activity.  Being overweight.  Having too much fat, sugar, calories, or salt (sodium) in your  diet.  Drinking too much alcohol. Factors that are difficult or impossible to change  Having chronic kidney disease.  Having a family history of high blood pressure.  Age. Risk increases with age.  Race. You may be at higher risk if you are African-American.  Gender. Men are at higher risk than women before age 26. After age 90, women are at higher risk than men.  Having obstructive sleep apnea.  Stress. What are the signs or symptoms? Extremely high blood pressure (hypertensive crisis) may cause:  Headache.  Anxiety.  Shortness of breath.  Nosebleed.  Nausea and vomiting.  Severe chest pain.  Jerky movements you cannot control (seizures).  How is this diagnosed? This condition is diagnosed by measuring your blood pressure while you are seated, with your arm resting on a surface. The cuff of the blood pressure monitor will be placed directly against the skin of your upper arm at the level of your heart. It should be measured at least twice using the same arm. Certain conditions can cause a difference in blood pressure between your right and left arms. Certain factors can cause blood pressure readings to be lower or higher than normal (elevated) for a short period of time:  When your blood pressure is higher when you are in a health care provider's office than when you are at home, this is called white coat hypertension. Most people with this condition do not need medicines.  When your blood pressure is higher at home than when you are in a health care provider's office, this is called masked hypertension. Most people with this condition may need medicines to control blood pressure.  If you have a high blood pressure reading during one visit or you have normal blood pressure with other risk factors:  You may be asked to return on a different day to have your blood pressure checked again.  You may be asked to monitor your blood pressure at home for 1 week or longer.  If  you are diagnosed with hypertension, you may have other blood or imaging tests to help your health care provider understand your overall risk for other conditions. How is this treated? This condition is treated by making healthy lifestyle changes, such as eating healthy foods, exercising more, and reducing your alcohol intake. Your health care provider may prescribe medicine if lifestyle changes are  not enough to get your blood pressure under control, and if:  Your systolic blood pressure is above 130.  Your diastolic blood pressure is above 80.  Your personal target blood pressure may vary depending on your medical conditions, your age, and other factors. Follow these instructions at home: Eating and drinking  Eat a diet that is high in fiber and potassium, and low in sodium, added sugar, and fat. An example eating plan is called the DASH (Dietary Approaches to Stop Hypertension) diet. To eat this way: ? Eat plenty of fresh fruits and vegetables. Try to fill half of your plate at each meal with fruits and vegetables. ? Eat whole grains, such as whole wheat pasta, brown rice, or whole grain bread. Fill about one quarter of your plate with whole grains. ? Eat or drink low-fat dairy products, such as skim milk or low-fat yogurt. ? Avoid fatty cuts of meat, processed or cured meats, and poultry with skin. Fill about one quarter of your plate with lean proteins, such as fish, chicken without skin, beans, eggs, and tofu. ? Avoid premade and processed foods. These tend to be higher in sodium, added sugar, and fat.  Reduce your daily sodium intake. Most people with hypertension should eat less than 1,500 mg of sodium a day.  Limit alcohol intake to no more than 1 drink a day for nonpregnant women and 2 drinks a day for men. One drink equals 12 oz of beer, 5 oz of wine, or 1 oz of hard liquor. Lifestyle  Work with your health care provider to maintain a healthy body weight or to lose weight. Ask  what an ideal weight is for you.  Get at least 30 minutes of exercise that causes your heart to beat faster (aerobic exercise) most days of the week. Activities may include walking, swimming, or biking.  Include exercise to strengthen your muscles (resistance exercise), such as pilates or lifting weights, as part of your weekly exercise routine. Try to do these types of exercises for 30 minutes at least 3 days a week.  Do not use any products that contain nicotine or tobacco, such as cigarettes and e-cigarettes. If you need help quitting, ask your health care provider.  Monitor your blood pressure at home as told by your health care provider.  Keep all follow-up visits as told by your health care provider. This is important. Medicines  Take over-the-counter and prescription medicines only as told by your health care provider. Follow directions carefully. Blood pressure medicines must be taken as prescribed.  Do not skip doses of blood pressure medicine. Doing this puts you at risk for problems and can make the medicine less effective.  Ask your health care provider about side effects or reactions to medicines that you should watch for. Contact a health care provider if:  You think you are having a reaction to a medicine you are taking.  You have headaches that keep coming back (recurring).  You feel dizzy.  You have swelling in your ankles.  You have trouble with your vision. Get help right away if:  You develop a severe headache or confusion.  You have unusual weakness or numbness.  You feel faint.  You have severe pain in your chest or abdomen.  You vomit repeatedly.  You have trouble breathing. Summary  Hypertension is when the force of blood pumping through your arteries is too strong. If this condition is not controlled, it may put you at risk for serious complications.  Your personal target blood pressure may vary depending on your medical conditions, your age, and  other factors. For most people, a normal blood pressure is less than 120/80.  Hypertension is treated with lifestyle changes, medicines, or a combination of both. Lifestyle changes include weight loss, eating a healthy, low-sodium diet, exercising more, and limiting alcohol. This information is not intended to replace advice given to you by your health care provider. Make sure you discuss any questions you have with your health care provider. Document Released: 10/10/2005 Document Revised: 09/07/2016 Document Reviewed: 09/07/2016 Elsevier Interactive Patient Education  2018 [employer] Inc.     Agustina Caroli, MD [facility]

## 2018-11-23 ENCOUNTER — Encounter: Payer: Self-pay | Admitting: Emergency Medicine

## 2018-12-17 ENCOUNTER — Telehealth: Payer: Self-pay | Admitting: Emergency Medicine

## 2018-12-17 NOTE — Telephone Encounter (Signed)
LVM Called pt regarding their appt scheduled for 4/24 with Dr. Mitchel Honour. Due to [place] being out of the office, we will need to reschedule the appt. When pt calls back, please reschedule at their convenience. Thank you!

## 2019-01-30 ENCOUNTER — Telehealth: Payer: Self-pay | Admitting: *Deleted

## 2019-01-30 NOTE — Telephone Encounter (Signed)
Scheduled AWV.

## 2019-02-15 ENCOUNTER — Ambulatory Visit: Payer: Medicare Other | Admitting: Emergency Medicine

## 2019-07-23 ENCOUNTER — Telehealth: Payer: Self-pay | Admitting: *Deleted

## 2019-07-23 NOTE — Telephone Encounter (Signed)
Schedule AWV.  

## 2019-07-24 ENCOUNTER — Telehealth (INDEPENDENT_AMBULATORY_CARE_PROVIDER_SITE_OTHER): Payer: Medicare Other | Admitting: Emergency Medicine

## 2019-07-24 ENCOUNTER — Other Ambulatory Visit: Payer: Self-pay

## 2019-07-24 ENCOUNTER — Encounter: Payer: Self-pay | Admitting: Emergency Medicine

## 2019-07-24 VITALS — Ht 65.0 in | Wt 189.0 lb

## 2019-07-24 DIAGNOSIS — R05 Cough: Secondary | ICD-10-CM | POA: Diagnosis not present

## 2019-07-24 DIAGNOSIS — Z1239 Encounter for other screening for malignant neoplasm of breast: Secondary | ICD-10-CM

## 2019-07-24 DIAGNOSIS — M5442 Lumbago with sciatica, left side: Secondary | ICD-10-CM

## 2019-07-24 DIAGNOSIS — R059 Cough, unspecified: Secondary | ICD-10-CM

## 2019-07-24 DIAGNOSIS — Z1211 Encounter for screening for malignant neoplasm of colon: Secondary | ICD-10-CM

## 2019-07-24 DIAGNOSIS — J22 Unspecified acute lower respiratory infection: Secondary | ICD-10-CM | POA: Diagnosis not present

## 2019-07-24 MED ORDER — AZITHROMYCIN 250 MG PO TABS: ORAL_TABLET | ORAL | 0 refills | Status: DC

## 2019-07-24 MED ORDER — TRAMADOL HCL 50 MG PO TABS: 50.0000 mg | ORAL_TABLET | Freq: Three times a day (TID) | ORAL | 0 refills | Status: DC | PRN

## 2019-07-24 NOTE — Progress Notes (Signed)
Telemedicine Encounter- SOAP NOTE Established Patient  This telephone encounter was conducted with the patient's (or proxy's) verbal consent via audio telecommunications: yes/no: Yes Patient was instructed to have this encounter in a suitably private space; and to only have persons present to whom they give permission to participate. In addition, patient identity was confirmed by use of name plus two identifiers (DOB and address).  I discussed the limitations, risks, security and privacy concerns of performing an evaluation and management service by telephone and the availability of in person appointments. I also discussed with the patient that there may be a patient responsible charge related to this service. The patient expressed understanding and agreed to proceed.  I spent a total of TIME; 0 MIN TO 60 MIN: 15 minutes talking with the patient or their proxy.  No chief complaint on file. Productive cough  Subjective   Darlene [name] is a 68 y.o. female established patient. Telephone visit today complaining of productive cough for the past week.  Non-smoker.  Has history of recurrent bronchitis.  History of hypertension.  Has been taking Mucinex cold with some relief.  No other significant symptoms.  Denies fever or chills.  Denies difficulty breathing, wheezing or chest pain.  Denies nausea vomiting abdominal pain or diarrhea.  HPI   Patient Active Problem List   Diagnosis Date Noted  . Essential hypertension 08/17/2018    History reviewed. No pertinent past medical history.  Current Outpatient Medications  Medication Sig Dispense Refill  . atenolol (TENORMIN) 50 MG tablet Take 1 tablet (50 mg total) by mouth daily. 90 tablet 3  . azithromycin (ZITHROMAX) 250 MG tablet Sig as indicated 6 tablet 0  . cyclobenzaprine (FLEXERIL) 10 MG tablet Take 0.5-1 tablets (5-10 mg total) by mouth 3 (three) times daily as needed for muscle spasms. (Patient not taking: Reported on 07/24/2019) 30  tablet 0  . lisinopril-hydrochlorothiazide (ZESTORETIC) 20-12.5 MG tablet Take 2 tablets by mouth daily. 180 tablet 3  . traMADol (ULTRAM) 50 MG tablet Take 1 tablet (50 mg total) by mouth every 8 (eight) hours as needed. 20 tablet 0   No current facility-administered medications for this visit.     No Known Allergies  Social History   Socioeconomic History  . Marital status: Married    Spouse name: Not on file  . Number of children: Not on file  . Years of education: Not on file  . Highest education level: Not on file  Occupational History  . Not on file  Social Needs  . Financial resource strain: Not on file  . Food insecurity    Worry: Not on file    Inability: Not on file  . Transportation needs    Medical: Not on file    Non-medical: Not on file  Tobacco Use  . Smoking status: Never Smoker  . Smokeless tobacco: Never Used  Substance and Sexual Activity  . Alcohol use: No  . Drug use: No  . Sexual activity: Never  Lifestyle  . Physical activity    Days per week: Not on file    Minutes per session: Not on file  . Stress: Not on file  Relationships  . Social Herbalist on phone: Not on file    Gets together: Not on file    Attends religious service: Not on file    Active member of club or organization: Not on file    Attends meetings of clubs or organizations: Not on file  Relationship status: Not on file  . Intimate partner violence    Fear of current or ex partner: Not on file    Emotionally abused: Not on file    Physically abused: Not on file    Forced sexual activity: Not on file  Other Topics Concern  . Not on file  Social History Narrative  . Not on file    Review of Systems  Constitutional: Negative.  Negative for chills and fever.  HENT: Positive for congestion. Negative for sore throat.   Eyes: Negative.   Respiratory: Positive for cough and sputum production. Negative for shortness of breath and wheezing.   Cardiovascular:  Negative.  Negative for chest pain and palpitations.  Gastrointestinal: Negative.  Negative for abdominal pain, diarrhea, nausea and vomiting.  Genitourinary: Negative.   Musculoskeletal: Negative for myalgias.  Skin: Negative.  Negative for rash.  Neurological: Negative for dizziness and headaches.  All other systems reviewed and are negative.   Objective  Alert and oriented x3 in no apparent respiratory distress. Vitals as reported by the patient: Today's Vitals   07/24/19 1132  Weight: 189 lb (85.7 kg)  Height: 5\' 5"  (1.651 m)    Diagnoses and all orders for this visit:  Lower respiratory infection -     azithromycin (ZITHROMAX) 250 MG tablet; Sig as indicated  Acute left-sided low back pain with left-sided sciatica -     traMADol (ULTRAM) 50 MG tablet; Take 1 tablet (50 mg total) by mouth every 8 (eight) hours as needed.  Breast cancer screening  Colon cancer screening -     Ambulatory referral to Gastroenterology  Cough     I discussed the assessment and treatment plan with the patient. The patient was provided an opportunity to ask questions and all were answered. The patient agreed with the plan and demonstrated an understanding of the instructions.   The patient was advised to call back or seek an in-person evaluation if the symptoms worsen or if the condition fails to improve as anticipated.  I provided 15 minutes of non-face-to-face time during this encounter.  Horald Pollen, MD  Primary Care at [facility]

## 2019-07-24 NOTE — Progress Notes (Signed)
Called patient to triage for appointment. Patient is complaining of a cough for 1 week nonproductive only clear mucus. Patient states she has bronchitis twice a week and she works at [employer]. Patient needs a refill on Tramadol.

## 2019-07-29 DIAGNOSIS — J208 Acute bronchitis due to other specified organisms: Secondary | ICD-10-CM | POA: Diagnosis not present

## 2019-07-29 DIAGNOSIS — R0981 Nasal congestion: Secondary | ICD-10-CM | POA: Diagnosis not present

## 2019-07-29 DIAGNOSIS — Z20828 Contact with and (suspected) exposure to other viral communicable diseases: Secondary | ICD-10-CM | POA: Diagnosis not present

## 2019-07-29 DIAGNOSIS — R05 Cough: Secondary | ICD-10-CM | POA: Diagnosis not present

## 2019-08-23 ENCOUNTER — Encounter: Payer: Self-pay | Admitting: Emergency Medicine

## 2019-09-02 ENCOUNTER — Other Ambulatory Visit: Payer: Self-pay | Admitting: Emergency Medicine

## 2019-09-02 DIAGNOSIS — I10 Essential (primary) hypertension: Secondary | ICD-10-CM

## 2019-09-13 MED ORDER — LISINOPRIL-HYDROCHLOROTHIAZIDE 20-12.5 MG PO TABS: 2.0000 | ORAL_TABLET | Freq: Every day | ORAL | 0 refills | Status: DC

## 2019-09-13 MED ORDER — ATENOLOL 50 MG PO TABS: 50.0000 mg | ORAL_TABLET | Freq: Every day | ORAL | 3 refills | Status: DC

## 2019-12-05 ENCOUNTER — Telehealth: Payer: Self-pay | Admitting: *Deleted

## 2019-12-05 NOTE — Telephone Encounter (Signed)
Schedule awv  

## 2020-01-02 ENCOUNTER — Encounter: Payer: Self-pay | Admitting: Emergency Medicine

## 2020-01-02 NOTE — Telephone Encounter (Signed)
Okay to take vaccine.  Everyone should take the vaccine.  Thanks

## 2020-01-08 ENCOUNTER — Other Ambulatory Visit: Payer: Self-pay | Admitting: Emergency Medicine

## 2020-01-08 DIAGNOSIS — I10 Essential (primary) hypertension: Secondary | ICD-10-CM

## 2020-01-08 MED ORDER — LISINOPRIL-HYDROCHLOROTHIAZIDE 20-12.5 MG PO TABS: 2.0000 | ORAL_TABLET | Freq: Every day | ORAL | 0 refills | Status: DC

## 2020-01-28 ENCOUNTER — Other Ambulatory Visit: Payer: Self-pay

## 2020-01-28 ENCOUNTER — Telehealth: Payer: Medicare Other | Admitting: Adult Health Nurse Practitioner

## 2020-01-28 ENCOUNTER — Encounter: Payer: Self-pay | Admitting: Adult Health Nurse Practitioner

## 2020-01-28 MED ORDER — AZITHROMYCIN 250 MG PO TABS: ORAL_TABLET | ORAL | 0 refills | Status: DC

## 2020-01-28 MED ORDER — PREDNISONE 20 MG PO TABS: ORAL_TABLET | ORAL | 0 refills | Status: DC

## 2020-01-28 MED ORDER — ALBUTEROL SULFATE HFA 108 (90 BASE) MCG/ACT IN AERS: 2.0000 | INHALATION_SPRAY | Freq: Four times a day (QID) | RESPIRATORY_TRACT | 1 refills | Status: DC | PRN

## 2020-01-28 NOTE — Progress Notes (Unsigned)
Telemedicine Encounter- SOAP NOTE Established Patient  This telephone encounter was conducted with the patient's (or proxy's) verbal consent via audio telecommunications: {yes/no:20286:::1} Patient was instructed to have this encounter in a suitably private space; and to only have persons present to whom they give permission to participate. In addition, patient identity was confirmed by use of name plus two identifiers (DOB and address).  I discussed the limitations, risks, security and privacy concerns of performing an evaluation and management service by telephone and the availability of in person appointments. I also discussed with the patient that there may be a patient responsible charge related to this service. The patient expressed understanding and agreed to proceed.  I spent a total of {TIME; 0 MIN TO 60 MIN:[phone]:::1} talking with the patient or their proxy.  Chief Complaint  Patient presents with  . intermittent cough since the begining of the year    pt has hx of bronchitis.  Using mucinex w/o relief.  Pt does hear some rattling and wheezing in chest at night. Pt is unable to clear mucus from her throat.  Mucus is clear, pt c/o some chest discomfort at night .  Coughing is more prevalent and bothersome at night but does cough during the day as well.  Pt is requesting an inhaler as she was prescribed one in the past by a past provider but he retired and she transferred care here.    Subjective   Darlene [name] is a 69 y.o. established patient. Telephone visit today for  HPI   Patient Active Problem List   Diagnosis Date Noted  . Essential hypertension 08/17/2018    No past medical history on file.  Current Outpatient Medications  Medication Sig Dispense Refill  . lisinopril-hydrochlorothiazide (ZESTORETIC) 20-12.5 MG tablet Take 2 tablets by mouth daily. 180 tablet 0  . atenolol (TENORMIN) 50 MG tablet Take 1 tablet (50 mg total) by mouth daily. 90 tablet 3  .  azithromycin (ZITHROMAX) 250 MG tablet Sig as indicated (Patient not taking: Reported on 01/28/2020) 6 tablet 0  . cyclobenzaprine (FLEXERIL) 10 MG tablet Take 0.5-1 tablets (5-10 mg total) by mouth 3 (three) times daily as needed for muscle spasms. (Patient not taking: Reported on 07/24/2019) 30 tablet 0  . traMADol (ULTRAM) 50 MG tablet Take 1 tablet (50 mg total) by mouth every 8 (eight) hours as needed. (Patient not taking: Reported on 01/28/2020) 20 tablet 0   No current facility-administered medications for this visit.    No Known Allergies  Social History   Socioeconomic History  . Marital status: Married    Spouse name: Not on file  . Number of children: Not on file  . Years of education: Not on file  . Highest education level: Not on file  Occupational History  . Not on file  Tobacco Use  . Smoking status: Never Smoker  . Smokeless tobacco: Never Used  Substance and Sexual Activity  . Alcohol use: No  . Drug use: No  . Sexual activity: Never  Other Topics Concern  . Not on file  Social History Narrative  . Not on file   Social Determinants of Health   Financial Resource Strain:   . Difficulty of Paying Living Expenses:   Food Insecurity:   . Worried About Charity fundraiser in the Last Year:   . Arboriculturist in the Last Year:   Transportation Needs:   . Film/video editor (Medical):   Marland Kitchen Lack of Transportation (Non-Medical):  Physical Activity:   . Days of Exercise per Week:   . Minutes of Exercise per Session:   Stress:   . Feeling of Stress :   Social Connections:   . Frequency of Communication with Friends and Family:   . Frequency of Social Gatherings with Friends and Family:   . Attends Religious Services:   . Active Member of Clubs or Organizations:   . Attends Archivist Meetings:   Marland Kitchen Marital Status:   Intimate Partner Violence:   . Fear of Current or Ex-Partner:   . Emotionally Abused:   Marland Kitchen Physically Abused:   . Sexually Abused:       ROS  Objective   Vitals as reported by the patient: There were no vitals filed for this visit.  There are no diagnoses linked to this encounter.   I discussed the assessment and treatment plan with the patient. The patient was provided an opportunity to ask questions and all were answered. The patient agreed with the plan and demonstrated an understanding of the instructions.   The patient was advised to call back or seek an in-person evaluation if the symptoms worsen or if the condition fails to improve as anticipated.  I provided *** minutes of non-face-to-face time during this encounter.  Glyn Ade, NP  Primary Care at [facility]

## 2020-01-28 NOTE — Patient Instructions (Signed)
° ° ° °  If you have lab work done today you will be contacted with your lab results within the next 2 weeks.  If you have not heard from us then please contact us. The fastest way to get your results is to register for My Chart. ° ° °IF you received an x-ray today, you will receive an invoice from [facility]. Please contact [facility] at [phone] with questions or concerns regarding your invoice.  ° °IF you received labwork today, you will receive an invoice from [facility]. Please contact [facility] at [phone] with questions or concerns regarding your invoice.  ° °Our billing staff will not be able to assist you with questions regarding bills from these companies. ° °You will be contacted with the lab results as soon as they are available. The fastest way to get your results is to activate your My Chart account. Instructions are located on the last page of this paperwork. If you have not heard from us regarding the results in 2 weeks, please contact this office. °  ° ° ° °

## 2020-02-02 ENCOUNTER — Ambulatory Visit: Payer: Self-pay

## 2020-02-02 NOTE — Telephone Encounter (Signed)
Pt's husband called to make appt for pt to get checked for covid. Heard pt in background with very frequent cough. Pt cough began 3-4 days ago. Cough is dry. Pt denies SOB, chest pain, or wheezing. Pt occasionally sweating. Denies subjective fever. Pt has been taking OTC cough syrup with cough suppressant. Last dose of cough syrup this am.  Covid testing appt made for tomorrow am 0815 at [facility]. Care advice given and pt's husband verbalized understanding. Advised to call 911 if c/o chest pain, difficulty breathing or worsens. Spouse verbalized understanding. Advised spouse to call in am to PCP office to make appt for lung evaluation.  Reason for Disposition . Cough  Answer Assessment - Initial Assessment Questions 1. ONSET: "When did the cough begin?"      3-4 days ago 2. SEVERITY: "How bad is the cough today?"      Constant cough  3. RESPIRATORY DISTRESS: "Describe your breathing."      No SOB,  4. FEVER: "Do you have a fever?" If so, ask: "What is your temperature, how was it measured, and when did it start?"     Not sure- sweating 5. HEMOPTYSIS: "Are you coughing up any blood?" If so ask: "How much?" (flecks, streaks, tablespoons, etc.)     no 6. TREATMENT: "What have you done so far to treat the cough?" (e.g., meds, fluids, humidifier)     Cough syrup with DM  7. CARDIAC HISTORY: "Do you have any history of heart disease?" (e.g., heart attack, congestive heart failure)      no 8. LUNG HISTORY: "Do you have any history of lung disease?"  (e.g., pulmonary embolus, asthma, emphysema)     no 9. PE RISK FACTORS: "Do you have a history of blood clots?" (or: recent major surgery, recent prolonged travel, bedridden)    no 10. OTHER SYMPTOMS: "Do you have any other symptoms? (e.g., runny nose, wheezing, chest pain)       no 11. PREGNANCY: "Is there any chance you are pregnant?" "When was your last menstrual period?"       n/a 12. TRAVEL: "Have you traveled out of the country in the last month?"  (e.g., travel history, exposures)       no  Protocols used: COUGH - ACUTE NON-PRODUCTIVE-A-AH

## 2020-02-03 ENCOUNTER — Other Ambulatory Visit: Payer: Self-pay

## 2020-02-03 NOTE — Telephone Encounter (Signed)
Pt requesting something to help with cough getting COVID test today No SOB

## 2020-02-10 ENCOUNTER — Telehealth: Payer: Medicare Other | Admitting: Emergency Medicine

## 2020-07-16 ENCOUNTER — Telehealth: Payer: Self-pay | Admitting: *Deleted

## 2020-07-16 NOTE — Telephone Encounter (Signed)
Schedule mobile mammogram unit 9-27 at [facility] Care at [facility]

## 2020-09-29 ENCOUNTER — Other Ambulatory Visit: Payer: Self-pay | Admitting: Emergency Medicine

## 2020-09-29 DIAGNOSIS — I10 Essential (primary) hypertension: Secondary | ICD-10-CM

## 2020-09-29 MED ORDER — LISINOPRIL-HYDROCHLOROTHIAZIDE 20-12.5 MG PO TABS: 2.0000 | ORAL_TABLET | Freq: Every day | ORAL | 0 refills | Status: DC

## 2020-09-29 MED ORDER — ATENOLOL 50 MG PO TABS: 50.0000 mg | ORAL_TABLET | Freq: Every day | ORAL | 3 refills | Status: DC

## 2020-09-29 NOTE — Telephone Encounter (Signed)
Pt needs OV for additional refills  °

## 2020-09-29 NOTE — Telephone Encounter (Signed)
LVMTCB to sch med refill appt

## 2020-09-29 NOTE — Telephone Encounter (Signed)
Pt needs an OV and labs for additional refills. Refilled medication today.

## 2020-12-09 DIAGNOSIS — Z23 Encounter for immunization: Secondary | ICD-10-CM | POA: Diagnosis not present

## 2020-12-24 ENCOUNTER — Encounter: Payer: Self-pay | Admitting: Emergency Medicine

## 2021-10-09 ENCOUNTER — Other Ambulatory Visit: Payer: Self-pay

## 2021-10-09 ENCOUNTER — Emergency Department (HOSPITAL_COMMUNITY): Payer: Medicare Other

## 2021-10-09 ENCOUNTER — Inpatient Hospital Stay (HOSPITAL_COMMUNITY)
Admission: EM | Admit: 2021-10-09 | Discharge: 2021-10-11 | DRG: 064 | Disposition: A | Discharge status: Home / Self Care | Payer: Medicare Other | Attending: Internal Medicine | Admitting: Internal Medicine

## 2021-10-09 ENCOUNTER — Encounter (HOSPITAL_COMMUNITY): Payer: Self-pay | Admitting: Emergency Medicine

## 2021-10-09 DIAGNOSIS — E785 Hyperlipidemia, unspecified: Secondary | ICD-10-CM | POA: Diagnosis present

## 2021-10-09 DIAGNOSIS — G9341 Metabolic encephalopathy: Secondary | ICD-10-CM | POA: Diagnosis not present

## 2021-10-09 DIAGNOSIS — I161 Hypertensive emergency: Secondary | ICD-10-CM | POA: Diagnosis present

## 2021-10-09 DIAGNOSIS — I10 Essential (primary) hypertension: Secondary | ICD-10-CM | POA: Diagnosis present

## 2021-10-09 DIAGNOSIS — Z79899 Other long term (current) drug therapy: Secondary | ICD-10-CM | POA: Diagnosis not present

## 2021-10-09 DIAGNOSIS — Z20822 Contact with and (suspected) exposure to covid-19: Secondary | ICD-10-CM | POA: Diagnosis present

## 2021-10-09 DIAGNOSIS — R202 Paresthesia of skin: Secondary | ICD-10-CM | POA: Diagnosis not present

## 2021-10-09 DIAGNOSIS — I959 Hypotension, unspecified: Secondary | ICD-10-CM | POA: Diagnosis not present

## 2021-10-09 DIAGNOSIS — I63531 Cerebral infarction due to unspecified occlusion or stenosis of right posterior cerebral artery: Secondary | ICD-10-CM | POA: Diagnosis not present

## 2021-10-09 DIAGNOSIS — I639 Cerebral infarction, unspecified: Secondary | ICD-10-CM | POA: Diagnosis not present

## 2021-10-09 DIAGNOSIS — R4182 Altered mental status, unspecified: Secondary | ICD-10-CM | POA: Diagnosis not present

## 2021-10-09 DIAGNOSIS — I6330 Cerebral infarction due to thrombosis of unspecified cerebral artery: Secondary | ICD-10-CM | POA: Insufficient documentation

## 2021-10-09 DIAGNOSIS — I6389 Other cerebral infarction: Secondary | ICD-10-CM | POA: Diagnosis not present

## 2021-10-09 DIAGNOSIS — I674 Hypertensive encephalopathy: Secondary | ICD-10-CM | POA: Diagnosis not present

## 2021-10-09 DIAGNOSIS — I6621 Occlusion and stenosis of right posterior cerebral artery: Secondary | ICD-10-CM | POA: Diagnosis not present

## 2021-10-09 DIAGNOSIS — I2510 Atherosclerotic heart disease of native coronary artery without angina pectoris: Secondary | ICD-10-CM | POA: Diagnosis not present

## 2021-10-09 DIAGNOSIS — R410 Disorientation, unspecified: Secondary | ICD-10-CM | POA: Diagnosis not present

## 2021-10-09 DIAGNOSIS — R569 Unspecified convulsions: Secondary | ICD-10-CM | POA: Diagnosis not present

## 2021-10-09 DIAGNOSIS — G4733 Obstructive sleep apnea (adult) (pediatric): Secondary | ICD-10-CM | POA: Diagnosis present

## 2021-10-09 DIAGNOSIS — R29700 NIHSS score 0: Secondary | ICD-10-CM | POA: Diagnosis present

## 2021-10-09 HISTORY — DX: Essential (primary) hypertension: I10

## 2021-10-09 LAB — COMPREHENSIVE METABOLIC PANEL
ALT: 20 U/L (ref 0–44)
AST: 17 U/L (ref 15–41)
Albumin: 3.6 g/dL (ref 3.5–5.0)
Alkaline Phosphatase: 69 U/L (ref 38–126)
Anion gap: 5 (ref 5–15)
BUN: 9 mg/dL (ref 8–23)
CO2: 28 mmol/L (ref 22–32)
Calcium: 9.3 mg/dL (ref 8.9–10.3)
Chloride: 106 mmol/L (ref 98–111)
Creatinine, Ser: 1.26 mg/dL — ABNORMAL HIGH (ref 0.44–1.00)
GFR, Estimated: 46 mL/min — ABNORMAL LOW (ref >60)
Glucose, Bld: 108 mg/dL — ABNORMAL HIGH (ref 70–99)
Potassium: 3.6 mmol/L (ref 3.5–5.1)
Sodium: 139 mmol/L (ref 135–145)
Total Bilirubin: 1.1 mg/dL (ref 0.3–1.2)
Total Protein: 7.3 g/dL (ref 6.5–8.1)

## 2021-10-09 LAB — LIPID PANEL
Cholesterol: 208 mg/dL — ABNORMAL HIGH (ref 0–200)
HDL: 41 mg/dL (ref >40)
LDL Cholesterol: 155 mg/dL — ABNORMAL HIGH (ref 0–99)
Total CHOL/HDL Ratio: 5.1 RATIO
Triglycerides: 59 mg/dL (ref <150)
VLDL: 12 mg/dL (ref 0–40)

## 2021-10-09 LAB — RESP PANEL BY RT-PCR (FLU A&B, COVID) ARPGX2
Influenza A by PCR: NEGATIVE
Influenza B by PCR: NEGATIVE
SARS Coronavirus 2 by RT PCR: NEGATIVE

## 2021-10-09 LAB — CBC
HCT: 38.3 % (ref 36.0–46.0)
Hemoglobin: 12.1 g/dL (ref 12.0–15.0)
MCH: 30.5 pg (ref 26.0–34.0)
MCHC: 31.6 g/dL (ref 30.0–36.0)
MCV: 96.5 fL (ref 80.0–100.0)
Platelets: 239 10*3/uL (ref 150–400)
RBC: 3.97 MIL/uL (ref 3.87–5.11)
RDW: 13.4 % (ref 11.5–15.5)
WBC: 6.8 10*3/uL (ref 4.0–10.5)
nRBC: 0 % (ref 0.0–0.2)

## 2021-10-09 LAB — URINALYSIS, ROUTINE W REFLEX MICROSCOPIC
Bilirubin Urine: NEGATIVE
Glucose, UA: NEGATIVE mg/dL
Hgb urine dipstick: NEGATIVE
Ketones, ur: NEGATIVE mg/dL
Leukocytes,Ua: NEGATIVE
Nitrite: NEGATIVE
Protein, ur: 30 mg/dL — AB
Specific Gravity, Urine: 1.025 (ref 1.005–1.030)
pH: 6 (ref 5.0–8.0)

## 2021-10-09 LAB — URINALYSIS, MICROSCOPIC (REFLEX)
Bacteria, UA: NONE SEEN
RBC / HPF: NONE SEEN RBC/hpf (ref 0–5)

## 2021-10-09 LAB — BRAIN NATRIURETIC PEPTIDE: B Natriuretic Peptide: 748.5 pg/mL — ABNORMAL HIGH (ref 0.0–100.0)

## 2021-10-09 LAB — TROPONIN I (HIGH SENSITIVITY)
Troponin I (High Sensitivity): 18 ng/L — ABNORMAL HIGH (ref <18)
Troponin I (High Sensitivity): 21 ng/L — ABNORMAL HIGH (ref <18)

## 2021-10-09 LAB — MRSA NEXT GEN BY PCR, NASAL: MRSA by PCR Next Gen: NOT DETECTED

## 2021-10-09 IMAGING — CT CT HEAD W/O CM
4 series · 16 of 47 positions shown, 18 images · non-contrast
Comparison: None.

CLINICAL DATA: Confusion. Head trauma. Elevated blood pressure. See
this is wider than

EXAM:
CT HEAD WITHOUT CONTRAST
TECHNIQUE: Contiguous axial images were obtained from the base of the skull
through the vertex without intravenous contrast.

[Series 3: head wo · axial · 0.39mm/px · z∈[-152,-47]mm · 7 of 29 slices shown, 9 images]
[im 4/29  brain]
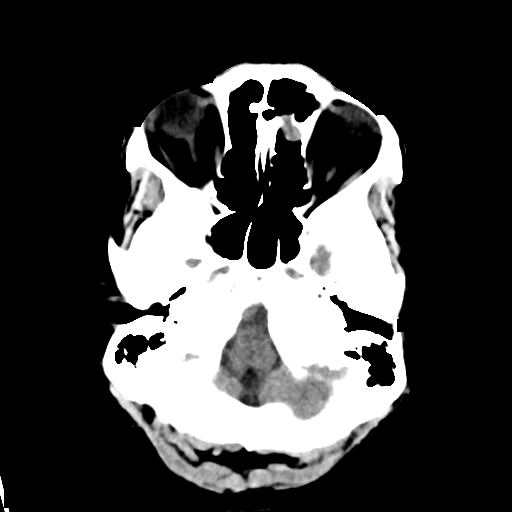
[im 4/29  bone]
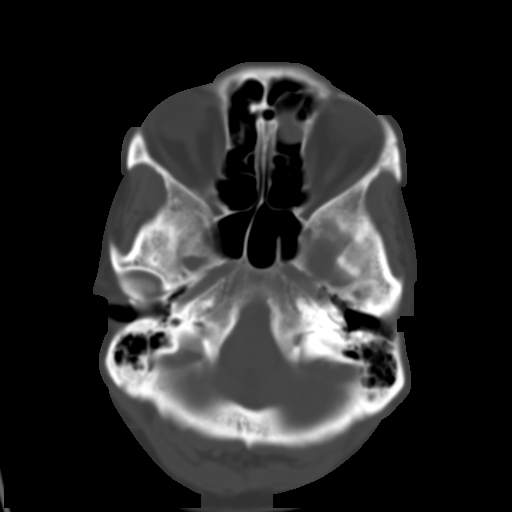
[im 8/29  brain]
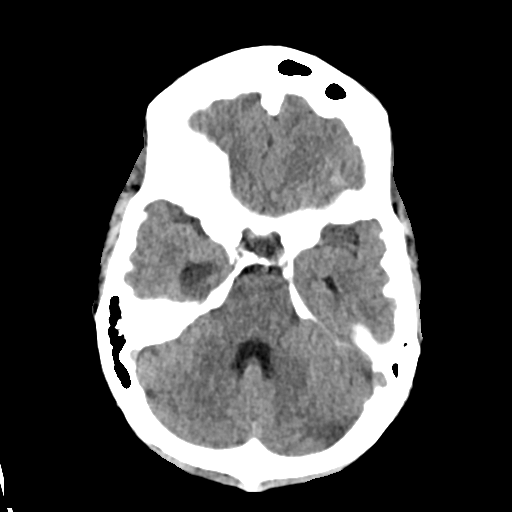
[im 11/29  brain]
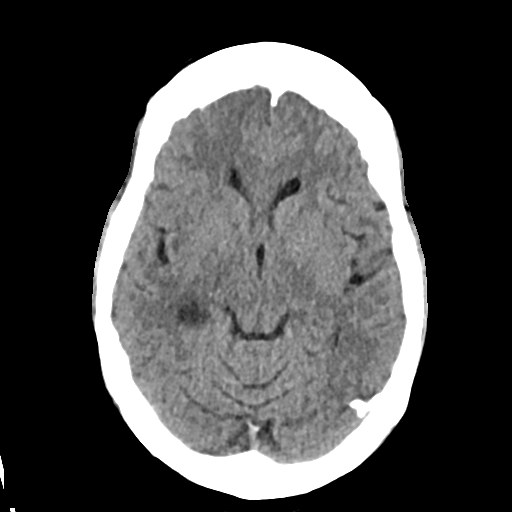
[im 15/29  brain]
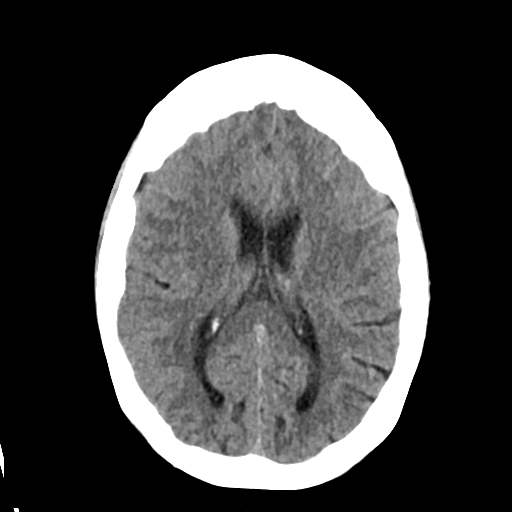
[im 18/29  brain]
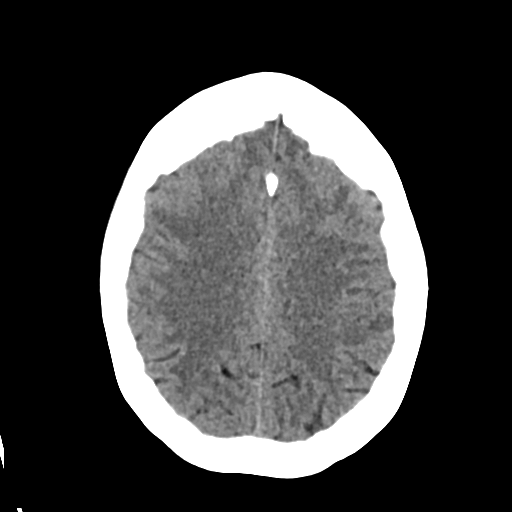
[im 18/29  bone]
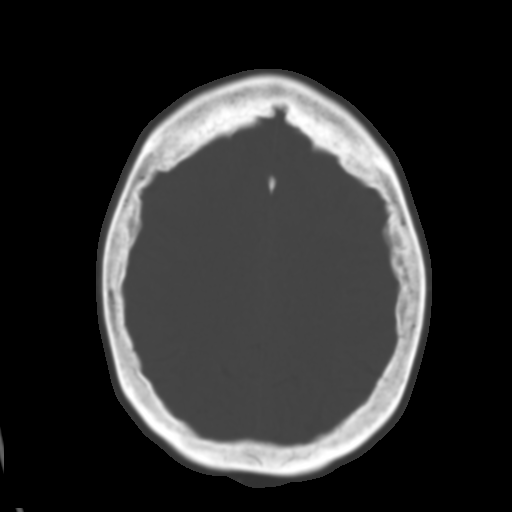
[im 22/29  brain]
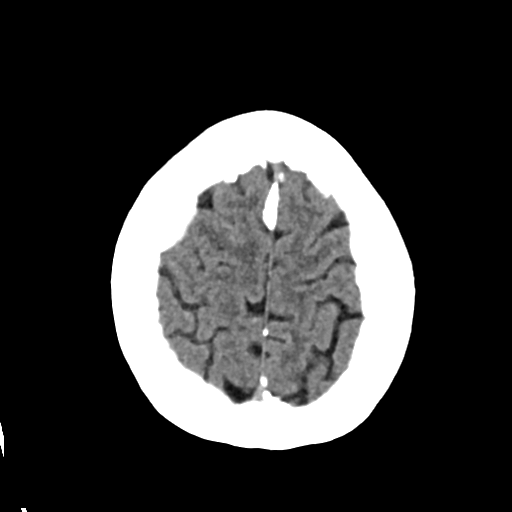
[im 25/29  brain]
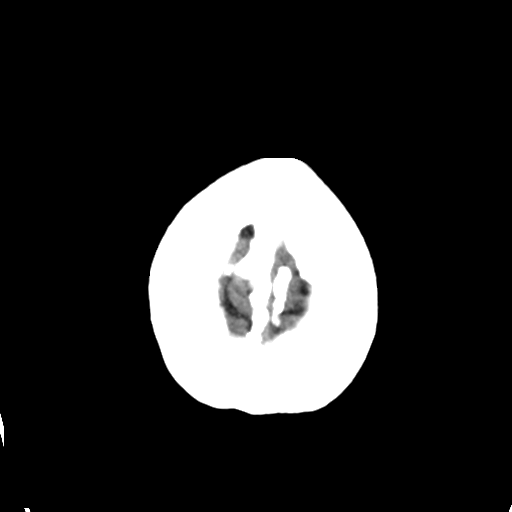

[Series 4: head bone · axial · 0.39mm/px · z∈[-153,-125]mm · 3 of 71 slices shown]
[im 8/71  bone]
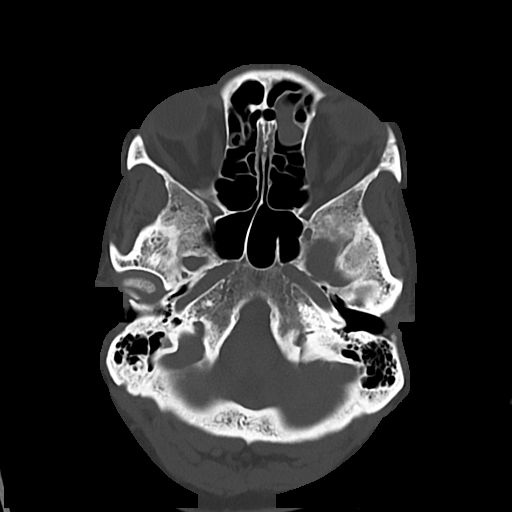
[im 15/71  bone]
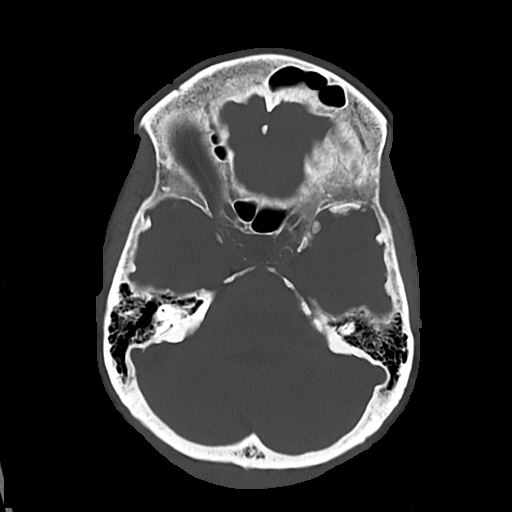
[im 22/71  bone]
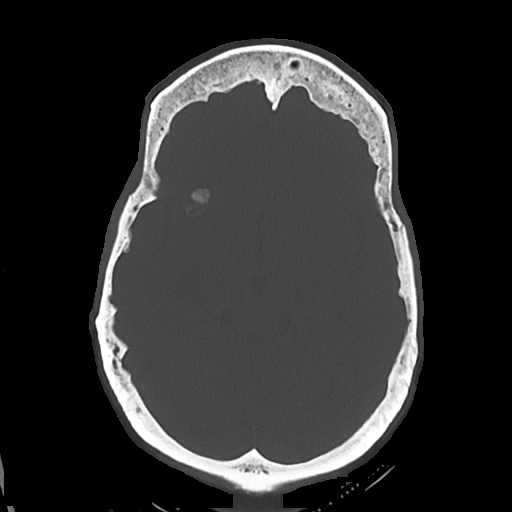

[Series 5: cor soft · coronal · 0.28mm/px · 3 of 64 slices shown]
[im 22/64  brain]
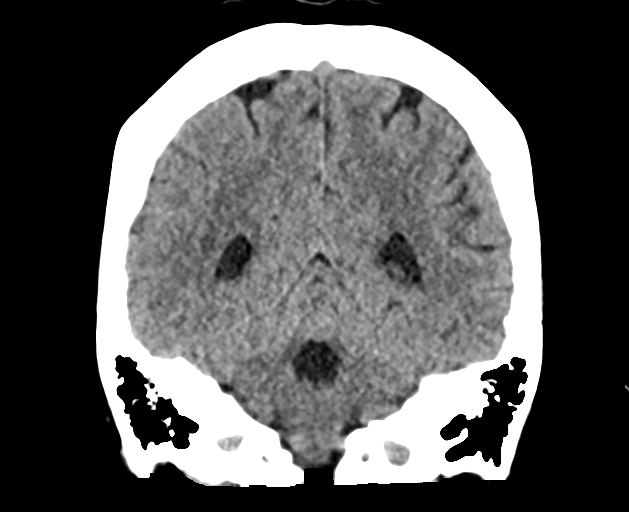
[im 29/64  brain]
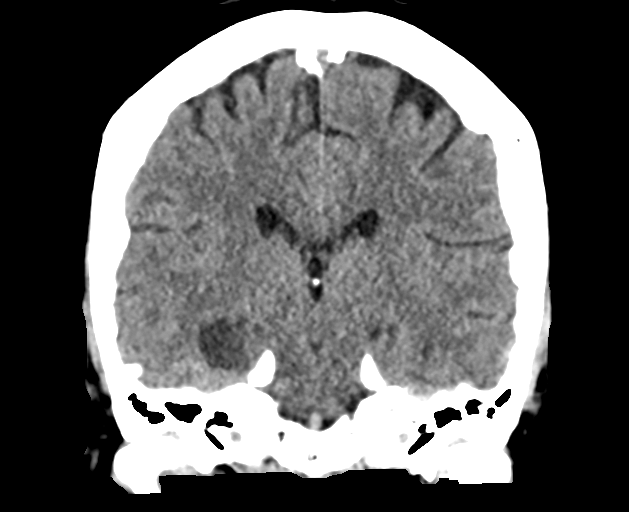
[im 36/64  brain]
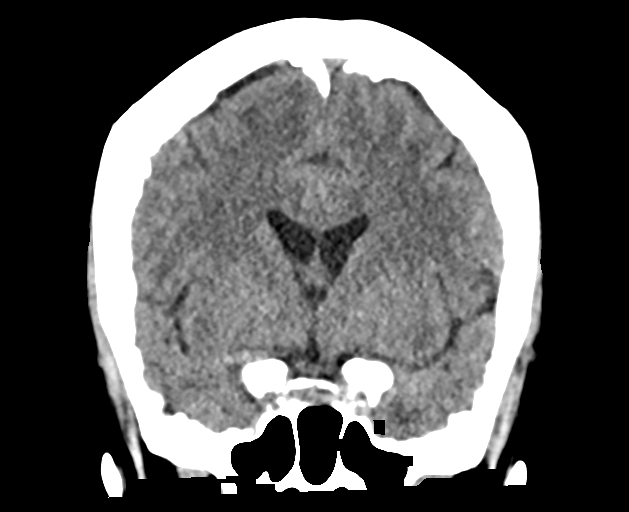

[Series 6: sag soft · sagittal · 0.30mm/px · 3 of 51 slices shown]
[im 17/51  brain]
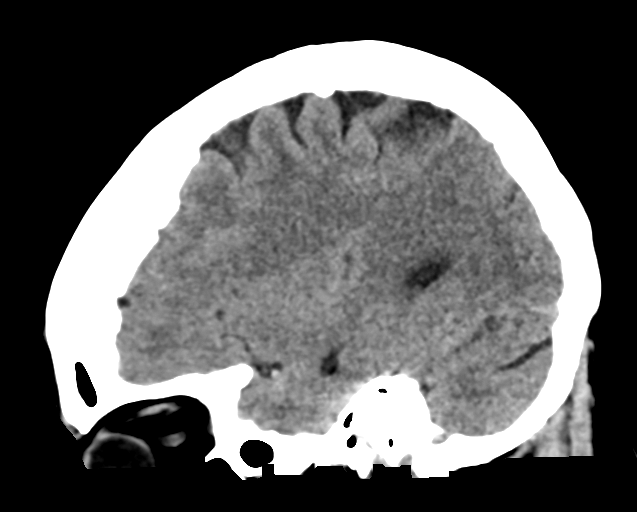
[im 26/51  brain]
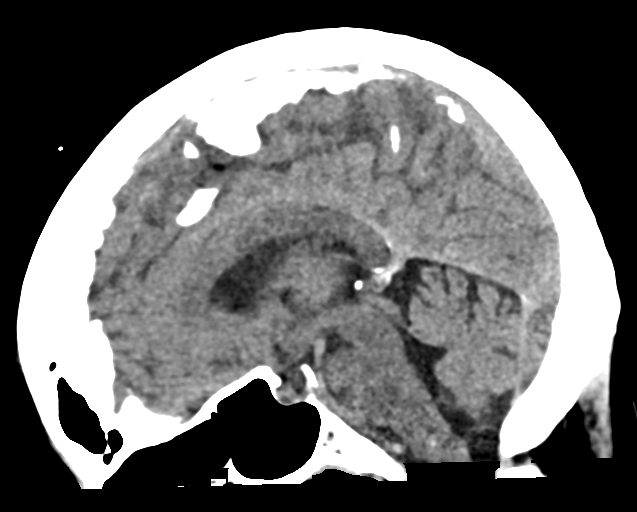
[im 34/51  brain]
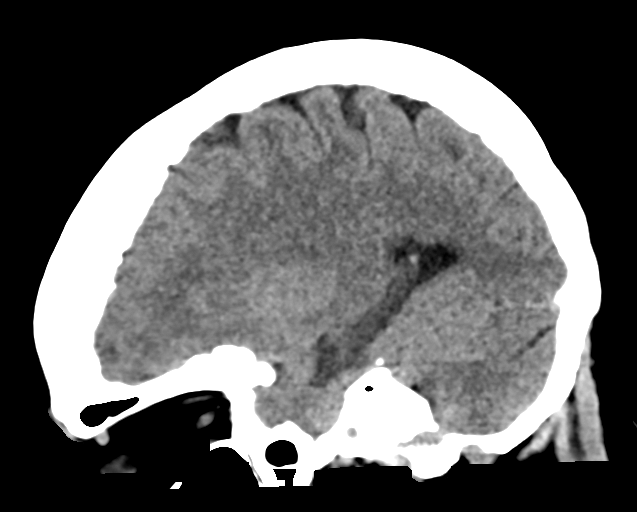

[16 of 47 positions shown; findings below may reference images not displayed]

FINDINGS: Brain: No evidence of acute infarction, hemorrhage, hydrocephalus,
extra-axial collection or mass lesion/mass effect. Asymmetric right
hippocampal atrophy with associated mild dilatation of the temporal
horn of the right lateral ventricle.

Vascular: No hyperdense vessel or unexpected calcification.

Skull: Negative for fracture.  Hyperostosis frontalis interna.

Sinuses/Orbits: No acute finding.

Other: None.
IMPRESSION: No acute intracranial abnormality.

## 2021-10-09 MED ORDER — HYDRALAZINE HCL 20 MG/ML IJ SOLN
10.0000 mg | INTRAMUSCULAR | Status: DC | PRN
Start: 2021-10-09 — End: 2021-10-09
  Filled 2021-10-09: qty 1

## 2021-10-09 MED ORDER — HYDRALAZINE HCL 25 MG PO TABS
25.0000 mg | ORAL_TABLET | Freq: Four times a day (QID) | ORAL | Status: DC | PRN
  Filled 2021-10-09: qty 1

## 2021-10-09 MED ORDER — HYDRALAZINE HCL 20 MG/ML IJ SOLN
10.0000 mg | INTRAMUSCULAR | Status: DC | PRN
  Administered 2021-10-09: 10 mg via INTRAVENOUS

## 2021-10-09 MED ORDER — ASPIRIN EC 81 MG PO TBEC
81.0000 mg | DELAYED_RELEASE_TABLET | Freq: Every day | ORAL | Status: DC
  Administered 2021-10-09 – 2021-10-11 (×3): 81 mg via ORAL
  Filled 2021-10-09 (×3): qty 1

## 2021-10-09 MED ORDER — ONDANSETRON HCL 4 MG PO TABS: 4.0000 mg | ORAL_TABLET | Freq: Four times a day (QID) | ORAL | Status: DC | PRN

## 2021-10-09 MED ORDER — HYDRALAZINE HCL 20 MG/ML IJ SOLN
20.0000 mg | INTRAMUSCULAR | Status: DC | PRN
  Filled 2021-10-09: qty 1

## 2021-10-09 MED ORDER — ALBUTEROL SULFATE (2.5 MG/3ML) 0.083% IN NEBU: 3.0000 mL | INHALATION_SOLUTION | Freq: Four times a day (QID) | RESPIRATORY_TRACT | Status: DC | PRN

## 2021-10-09 MED ORDER — ONDANSETRON HCL 4 MG/2ML IJ SOLN: 4.0000 mg | Freq: Four times a day (QID) | INTRAMUSCULAR | Status: DC | PRN

## 2021-10-09 MED ORDER — HYDROCHLOROTHIAZIDE 12.5 MG PO TABS
12.5000 mg | ORAL_TABLET | Freq: Every day | ORAL | Status: DC
  Administered 2021-10-09: 12.5 mg via ORAL
  Filled 2021-10-09: qty 1

## 2021-10-09 MED ORDER — AMLODIPINE BESYLATE 5 MG PO TABS: 5.0000 mg | ORAL_TABLET | Freq: Every day | ORAL | Status: DC

## 2021-10-09 MED ORDER — LISINOPRIL 20 MG PO TABS
20.0000 mg | ORAL_TABLET | Freq: Every day | ORAL | Status: DC
  Administered 2021-10-09: 20 mg via ORAL
  Filled 2021-10-09: qty 1

## 2021-10-09 MED ORDER — LISINOPRIL-HYDROCHLOROTHIAZIDE 20-12.5 MG PO TABS: 2.0000 | ORAL_TABLET | Freq: Every day | ORAL | Status: DC

## 2021-10-09 MED ORDER — CLONIDINE HCL 0.1 MG PO TABS
0.1000 mg | ORAL_TABLET | Freq: Once | ORAL | Status: AC
  Administered 2021-10-09: 0.1 mg via ORAL
  Filled 2021-10-09: qty 1

## 2021-10-09 MED ORDER — ACETAMINOPHEN 325 MG PO TABS
650.0000 mg | ORAL_TABLET | Freq: Four times a day (QID) | ORAL | Status: DC | PRN
  Administered 2021-10-10: 01:00:00 650 mg via ORAL
  Filled 2021-10-09: qty 2

## 2021-10-09 MED ORDER — AMLODIPINE BESYLATE 2.5 MG PO TABS
2.5000 mg | ORAL_TABLET | Freq: Every day | ORAL | Status: DC
  Administered 2021-10-09: 2.5 mg via ORAL
  Filled 2021-10-09: qty 1

## 2021-10-09 MED ORDER — ACETAMINOPHEN 650 MG RE SUPP: 650.0000 mg | Freq: Four times a day (QID) | RECTAL | Status: DC | PRN

## 2021-10-09 MED ORDER — ENOXAPARIN SODIUM 40 MG/0.4ML IJ SOSY
40.0000 mg | PREFILLED_SYRINGE | INTRAMUSCULAR | Status: DC
  Administered 2021-10-09 – 2021-10-10 (×2): 40 mg via SUBCUTANEOUS
  Filled 2021-10-09 (×2): qty 0.4

## 2021-10-09 MED ORDER — AMLODIPINE BESYLATE 5 MG PO TABS: 2.5000 mg | ORAL_TABLET | Freq: Every day | ORAL | Status: DC

## 2021-10-09 NOTE — ED Notes (Signed)
Got patient undressed on the monitor did ekg shown to er provider patient is resting with family at bedside and call bell in reach

## 2021-10-09 NOTE — ED Notes (Signed)
RN messaged pharmacy for verification of unverified 1830 & 1900 medications

## 2021-10-09 NOTE — H&P (Signed)
History and Physical    Darlene [name] DGL:[id number] DOB: [birth year] DOA: 10/09/2021  PCP: Horald Pollen, MD (Confirm with patient/family/NH records and if not entered, this has to be entered at [facility] point of entry) Patient coming from: Home  I have personally briefly reviewed patient's old medical records in [place]  Chief Complaint: I am feeling ok  HPI: Darlene [name] is a 70 y.o. female with medical history significant of HTN, with brought in by family member for persistent confusion.  Patient lives by herself, and she takes atenolol and HCTZ/lisinopril for high blood pressure.  She ran out of HCTZ lisinopril for about 7 days but does not have a prescription for the refill. About 3 days ago, daughter who lives in [place] called her and found that she "sounds really confused and talking non-sense", but patient just dismissed her. Daughter called back again next day and yesterday, found patient continued to have in-and-out confusion, and decided to drive in today and took patient to ED. patient denied any headache, no chest pains no shortness of breath.  She reported that she had bilateral fingertips tingling at one point several days ago but resolved.  ED Course: SBP>200.  CT head negative for acute finding, chest x-ray no acute infiltrates.  CBC BMP largely normal.  Patient was given 1 dose of clonidine, SBP lowered to 170s then rebound back to 200s.  Review of Systems: As per HPI otherwise 14 point review of systems negative.    Past Medical History:  Diagnosis Date   Hypertension     History reviewed. No pertinent surgical history.   reports that she has never smoked. She has never used smokeless tobacco. She reports that she does not drink alcohol and does not use drugs.  No Known Allergies  No family history on file.   Prior to Admission medications   Medication Sig Start Date End Date Taking? Authorizing Provider  albuterol (VENTOLIN HFA) 108 (90  Base) MCG/ACT inhaler Inhale 2 puffs into the lungs every 6 (six) hours as needed for wheezing or shortness of breath. 01/28/20 02/27/20  Wendall Mola, NP  atenolol (TENORMIN) 50 MG tablet Take 1 tablet (50 mg total) by mouth daily. 09/29/20 12/28/20  Horald Pollen, MD  azithromycin [facility]) 250 MG tablet As directed on pkg label 01/28/20   Wendall Mola, NP  cyclobenzaprine (FLEXERIL) 10 MG tablet Take 0.5-1 tablets (5-10 mg total) by mouth 3 (three) times daily as needed for muscle spasms. Patient not taking: Reported on 07/24/2019 03/10/17   Tereasa Coop, PA-C  lisinopril-hydrochlorothiazide (ZESTORETIC) 20-12.5 MG tablet Take 2 tablets by mouth daily. 09/29/20   Horald Pollen, MD  predniSONE (DELTASONE) 20 MG tablet 60mg  x 2 days, 40mg  x 4 days, 20mg  x 4 days 01/28/20   Wendall Mola, NP  traMADol (ULTRAM) 50 MG tablet Take 1 tablet (50 mg total) by mouth every 8 (eight) hours as needed. Patient not taking: Reported on 01/28/2020 07/24/19   Horald Pollen, MD    Physical Exam: Vitals:   10/09/21 1715 10/09/21 1730 10/09/21 1745 10/09/21 1800  BP: (!) 192/77 (!) 196/64 (!) 168/94 (!) 184/74  Pulse: (!) 49 (!) 51 (!) 47 (!) 49  Resp: 19 (!) 24 (!) 25 16  Temp:      TempSrc:      SpO2: 94% 98% 100% 100%    Constitutional: NAD, calm, comfortable Vitals:   10/09/21 1715 10/09/21 1730 10/09/21 1745 10/09/21 1800  BP: Marland Kitchen)  192/77 (!) 196/64 (!) 168/94 (!) 184/74  Pulse: (!) 49 (!) 51 (!) 47 (!) 49  Resp: 19 (!) 24 (!) 25 16  Temp:      TempSrc:      SpO2: 94% 98% 100% 100%   Eyes: PERRL, lids and conjunctivae normal ENMT: Mucous membranes are moist. Posterior pharynx clear of any exudate or lesions.Normal dentition.  Neck: normal, supple, no masses, no thyromegaly Respiratory: clear to auscultation bilaterally, no wheezing, no crackles. Normal respiratory effort. No accessory muscle use.  Cardiovascular: Regular rate and rhythm, no murmurs / rubs /  gallops. No extremity edema. 2+ pedal pulses. No carotid bruits.  Abdomen: no tenderness, no masses palpated. No hepatosplenomegaly. Bowel sounds positive.  Musculoskeletal: no clubbing / cyanosis. No joint deformity upper and lower extremities. Good ROM, no contractures. Normal muscle tone.  Skin: no rashes, lesions, ulcers. No induration Neurologic: CN 2-12 grossly intact. Sensation intact, DTR normal. Strength 5/5 in all 4.  Psychiatric: Normal judgment and insight. Alert and oriented x 3. Normal mood.     Labs on Admission: I have personally reviewed following labs and imaging studies  CBC: Recent Labs  Lab 10/09/21 1503  WBC 6.8  HGB 12.1  HCT 38.3  MCV 96.5  PLT 220   Basic Metabolic Panel: Recent Labs  Lab 10/09/21 1503  NA 139  K 3.6  CL 106  CO2 28  GLUCOSE 108*  BUN 9  CREATININE 1.26*  CALCIUM 9.3   GFR: CrCl cannot be calculated (Unknown ideal weight.). Liver Function Tests: Recent Labs  Lab 10/09/21 1503  AST 17  ALT 20  ALKPHOS 69  BILITOT 1.1  PROT 7.3  ALBUMIN 3.6   No results for input(s): LIPASE, AMYLASE in the last 168 hours. No results for input(s): AMMONIA in the last 168 hours. Coagulation Profile: No results for input(s): INR, PROTIME in the last 168 hours. Cardiac Enzymes: No results for input(s): CKTOTAL, CKMB, CKMBINDEX, TROPONINI in the last 168 hours. BNP (last 3 results) No results for input(s): PROBNP in the last 8760 hours. HbA1C: No results for input(s): HGBA1C in the last 72 hours. CBG: No results for input(s): GLUCAP in the last 168 hours. Lipid Profile: No results for input(s): CHOL, HDL, LDLCALC, TRIG, CHOLHDL, LDLDIRECT in the last 72 hours. Thyroid Function Tests: No results for input(s): TSH, T4TOTAL, FREET4, T3FREE, THYROIDAB in the last 72 hours. Anemia Panel: No results for input(s): VITAMINB12, FOLATE, FERRITIN, TIBC, IRON, RETICCTPCT in the last 72 hours. Urine analysis: No results found for: COLORURINE,  APPEARANCEUR, LABSPEC, PHURINE, GLUCOSEU, HGBUR, BILIRUBINUR, KETONESUR, PROTEINUR, UROBILINOGEN, NITRITE, LEUKOCYTESUR  Radiological Exams on Admission: CT HEAD WO CONTRAST (5MM)  Result Date: 10/09/2021 CLINICAL DATA:  Confusion. Head trauma. Elevated blood pressure. See this is wider than EXAM: CT HEAD WITHOUT CONTRAST TECHNIQUE: Contiguous axial images were obtained from the base of the skull through the vertex without intravenous contrast. COMPARISON:  None. FINDINGS: Brain: No evidence of acute infarction, hemorrhage, hydrocephalus, extra-axial collection or mass lesion/mass effect. Asymmetric right hippocampal atrophy with associated mild dilatation of the temporal horn of the right lateral ventricle. Vascular: No hyperdense vessel or unexpected calcification. Skull: Negative for fracture.  Hyperostosis frontalis interna. Sinuses/Orbits: No acute finding. Other: None. IMPRESSION: No acute intracranial abnormality. Electronically Signed   By: Ileana Roup M.D.   On: 10/09/2021 16:50   DG Chest Portable 1 View  Result Date: 10/09/2021 CLINICAL DATA:  Confusion.  Recent fall. EXAM: PORTABLE CHEST 1 VIEW COMPARISON:  None. FINDINGS: Of  note, the patient is mildly rotated on this portable examination. Borderline enlarged cardiac silhouette. Both lungs are clear. Multilevel degenerative changes in the mid and distal thoracic spine. No acute osseous abnormality. IMPRESSION: No active disease. Electronically Signed   By: Ileana Roup M.D.   On: 10/09/2021 16:27    EKG: Independently reviewed. Sinus bradycardia, no acute ST-T changes.  Assessment/Plan Principal Problem:   HTN (hypertension), malignant Active Problems:   Acute metabolic encephalopathy  (please populate well all problems here in Problem List. (For example, if patient is on BP meds at home and you resume or decide to hold them, it is a problem that needs to be her. Same for CAD, COPD, HLD and so on)  HTN emergency -Secondary to  noncoherent with blood pressure medications -Restart HCTZ/hydrochlorothiazide, add amlodipine 5 milligrams daily, start PRN hydralazine, aiming at decreasing SBP less than 25% tonight. -Hold atenolol for bradycardia. -Start ASA 81 mg daily for primary prevention  Bradycardia -Hold atenolol, avoid further clonidine.  Acute metabolic encephalopathy -Likely secondary to hypotension present, largely resolved. -Continue blood pressure control as above.  DVT prophylaxis: Lovenox Code Status: Full code Family Communication: Daughter at bedside Disposition Plan: Expect less than 2 midnight hospital stay Consults called: None Admission status: Tele obs   Lequita Halt MD [facility] Pager [phone]  10/09/2021, 6:27 PM

## 2021-10-09 NOTE — ED Triage Notes (Signed)
Daughter reports intermittent confusion since Wednesday.  States pt is stating things from 10 years ago and repeating herself.  LKW Monday.  Daughter states pt fell in bathroom on Thursday.  Pt states she just stumbled and denies injury.  No blood thinners.  No numbness/weakness.  Pt states she feels fine.

## 2021-10-09 NOTE — Progress Notes (Signed)
Pt still with some confusion despite improvement in BP.  Will order MRI brain to r/o stroke.

## 2021-10-09 NOTE — ED Provider Notes (Signed)
[facility] EMERGENCY DEPARTMENT Provider Note   CSN: [id number] Arrival date & time: 10/09/21  1424     History Chief Complaint  Patient presents with   Altered Mental Status    Darlene [name] is a 70 y.o. female.  The history is provided by the patient.  Altered Mental Status Presenting symptoms: confusion   Severity:  Mild Most recent episode:  More than 2 days ago Episode history:  Multiple Timing:  Intermittent Progression:  Waxing and waning Chronicity:  New Context: not dementia   Associated symptoms: headaches   Associated symptoms: no abdominal pain, normal movement, no agitation, no bladder incontinence, no decreased appetite, no depression, no difficulty breathing, no eye deviation, no fever, no hallucinations, no light-headedness, no nausea, no palpitations, no rash, no seizures, no slurred speech, no suicidal behavior, no visual change, no vomiting and no weakness       Past Medical History:  Diagnosis Date   Hypertension     Patient Active Problem List   Diagnosis Date Noted   Essential hypertension 08/17/2018    History reviewed. No pertinent surgical history.   OB History   No obstetric history on file.     No family history on file.  Social History   Tobacco Use   Smoking status: Never   Smokeless tobacco: Never  Substance Use Topics   Alcohol use: No   Drug use: No    Home Medications Prior to Admission medications   Medication Sig Start Date End Date Taking? Authorizing Provider  albuterol (VENTOLIN HFA) 108 (90 Base) MCG/ACT inhaler Inhale 2 puffs into the lungs every 6 (six) hours as needed for wheezing or shortness of breath. 01/28/20 02/27/20  Wendall Mola, NP  atenolol (TENORMIN) 50 MG tablet Take 1 tablet (50 mg total) by mouth daily. 09/29/20 12/28/20  Horald Pollen, MD  azithromycin [facility]) 250 MG tablet As directed on pkg label 01/28/20   Wendall Mola, NP  cyclobenzaprine (FLEXERIL) 10 MG  tablet Take 0.5-1 tablets (5-10 mg total) by mouth 3 (three) times daily as needed for muscle spasms. Patient not taking: Reported on 07/24/2019 03/10/17   Tereasa Coop, PA-C  lisinopril-hydrochlorothiazide (ZESTORETIC) 20-12.5 MG tablet Take 2 tablets by mouth daily. 09/29/20   Horald Pollen, MD  predniSONE (DELTASONE) 20 MG tablet 60mg  x 2 days, 40mg  x 4 days, 20mg  x 4 days 01/28/20   Wendall Mola, NP  traMADol (ULTRAM) 50 MG tablet Take 1 tablet (50 mg total) by mouth every 8 (eight) hours as needed. Patient not taking: Reported on 01/28/2020 07/24/19   Horald Pollen, MD    Allergies    Patient has no known allergies.  Review of Systems   Review of Systems  Constitutional:  Negative for chills, decreased appetite and fever.  HENT:  Negative for ear pain and sore throat.   Eyes:  Negative for pain and visual disturbance.  Respiratory:  Negative for cough and shortness of breath.   Cardiovascular:  Negative for chest pain and palpitations.  Gastrointestinal:  Negative for abdominal pain, nausea and vomiting.  Genitourinary:  Negative for bladder incontinence, dysuria and hematuria.  Musculoskeletal:  Negative for arthralgias and back pain.  Skin:  Negative for color change and rash.  Neurological:  Positive for headaches. Negative for dizziness, tremors, seizures, syncope, facial asymmetry, speech difficulty, weakness, light-headedness and numbness.  Psychiatric/Behavioral:  Positive for confusion. Negative for agitation and hallucinations.   All other systems reviewed and are negative.  Physical Exam Updated Vital Signs BP (!) 184/74    Pulse (!) 49    Temp 98.4 F (36.9 C) (Oral)    Resp 16    SpO2 100%   Physical Exam Vitals and nursing note reviewed.  Constitutional:      General: She is not in acute distress.    Appearance: She is well-developed. She is not ill-appearing.  HENT:     Head: Normocephalic and atraumatic.     Nose: Nose normal.      Mouth/Throat:     Mouth: Mucous membranes are moist.  Eyes:     Extraocular Movements: Extraocular movements intact.     Conjunctiva/sclera: Conjunctivae normal.     Pupils: Pupils are equal, round, and reactive to light.  Cardiovascular:     Rate and Rhythm: Normal rate and regular rhythm.     Pulses: Normal pulses.     Heart sounds: Normal heart sounds. No murmur heard. Pulmonary:     Effort: Pulmonary effort is normal. No respiratory distress.     Breath sounds: Normal breath sounds.  Abdominal:     Palpations: Abdomen is soft.     Tenderness: There is no abdominal tenderness.  Musculoskeletal:        General: No swelling.     Cervical back: Normal range of motion and neck supple.  Skin:    General: Skin is warm and dry.     Capillary Refill: Capillary refill takes less than 2 seconds.  Neurological:     General: No focal deficit present.     Mental Status: She is alert and oriented to person, place, and time.     Cranial Nerves: No cranial nerve deficit.     Sensory: No sensory deficit.     Motor: No weakness.     Coordination: Coordination normal.     Gait: Gait normal.     Comments: 5+ out of 5 strength throughout, normal speech, normal visual fields, no drift  Psychiatric:        Mood and Affect: Mood normal.    ED Results / Procedures / Treatments   Labs (all labs ordered are listed, but only abnormal results are displayed) Labs Reviewed  COMPREHENSIVE METABOLIC PANEL - Abnormal; Notable for the following components:      Result Value   Glucose, Bld 108 (*)    Creatinine, Ser 1.26 (*)    GFR, Estimated 46 (*)    All other components within normal limits  BRAIN NATRIURETIC PEPTIDE - Abnormal; Notable for the following components:   B Natriuretic Peptide 748.5 (*)    All other components within normal limits  TROPONIN I (HIGH SENSITIVITY) - Abnormal; Notable for the following components:   Troponin I (High Sensitivity) 18 (*)    All other components within  normal limits  RESP PANEL BY RT-PCR (FLU A&B, COVID) ARPGX2  CBC  URINALYSIS, ROUTINE W REFLEX MICROSCOPIC  TROPONIN I (HIGH SENSITIVITY)    EKG EKG Interpretation  Date/Time:  Saturday October 09 2021 16:02:55 EST Ventricular Rate:  53 PR Interval:  140 QRS Duration: 88 QT Interval:  420 QTC Calculation: 395 R Axis:   56 Text Interpretation: Sinus rhythm LAE, consider biatrial enlargement Minimal ST depression, inferior leads Minimal ST elevation, anterior leads Confirmed by Lennice Sites ([id number]) on 10/09/2021 4:11:40 PM  Radiology CT HEAD WO CONTRAST (5MM)  Result Date: 10/09/2021 CLINICAL DATA:  Confusion. Head trauma. Elevated blood pressure. See this is wider than EXAM: CT HEAD WITHOUT CONTRAST TECHNIQUE: Contiguous  axial images were obtained from the base of the skull through the vertex without intravenous contrast. COMPARISON:  None. FINDINGS: Brain: No evidence of acute infarction, hemorrhage, hydrocephalus, extra-axial collection or mass lesion/mass effect. Asymmetric right hippocampal atrophy with associated mild dilatation of the temporal horn of the right lateral ventricle. Vascular: No hyperdense vessel or unexpected calcification. Skull: Negative for fracture.  Hyperostosis frontalis interna. Sinuses/Orbits: No acute finding. Other: None. IMPRESSION: No acute intracranial abnormality. Electronically Signed   By: Ileana Roup M.D.   On: 10/09/2021 16:50   DG Chest Portable 1 View  Result Date: 10/09/2021 CLINICAL DATA:  Confusion.  Recent fall. EXAM: PORTABLE CHEST 1 VIEW COMPARISON:  None. FINDINGS: Of note, the patient is mildly rotated on this portable examination. Borderline enlarged cardiac silhouette. Both lungs are clear. Multilevel degenerative changes in the mid and distal thoracic spine. No acute osseous abnormality. IMPRESSION: No active disease. Electronically Signed   By: Ileana Roup M.D.   On: 10/09/2021 16:27    Procedures Procedures   Medications Ordered  in ED Medications  cloNIDine (CATAPRES) tablet 0.1 mg (0.1 mg Oral Given 10/09/21 1641)    ED Course  I have reviewed the triage vital signs and the nursing notes.  Pertinent labs & imaging results that were available during my care of the patient were reviewed by me and considered in my medical decision making (see chart for details).    MDM Rules/Calculators/A&P                          Fara Worthy is here with intermittent confusion.  Overall patient with high blood pressure but otherwise normal vitals.  Possibly with some disorganized speech intermittently over the last few days but currently at her baseline.  No seizure-like activity.  No weakness or numbness or stroke symptoms otherwise.  Has not been compliant with her blood pressure medicine.  States may be some leg swelling recently but no chest pain or shortness of breath.  No infectious symptoms.  Neurologically she is intact.  She has no weakness or numbness.  She has normal speech.  No visual changes.  EKG shows sinus rhythm.  No obvious ischemic changes.  Head CT and chest x-ray unremarkable.  No head bleed.  No volume overload on chest x-ray.  No significant anemia or electrolyte abnormality or kidney injury.  Awaiting troponin, BNP, viral testing.  Not sure if this is stress related symptoms or may be some confusion secondary to high blood pressure.  But currently she is asymptomatic.  Not concern for stroke.  Seems like this is fairly new and this is less likely dementia.  She has not been taking her blood pressure medicine because she does not have a current prescription for her lisinopril-hydrochlorothiazide.  I will give her dose of clonidine while she is here.  Blood pressure has improved slightly to 180/70 following clonidine.  CT scan of the head is unremarkable.  Chest x-ray with no obvious pneumonia.  BNP and troponin are mildly elevated and overall suspect she is having elements of hypertensive encephalopathy and now with  some strain on her heart.  She does not have a very good blood pressure regimen to follow outpatient.  Overall will admit her for hypertensive encephalopathy/emergency.  This chart was dictated using voice recognition software.  Despite best efforts to proofread,  errors can occur which can change the documentation meaning.      Final Clinical Impression(s) / ED Diagnoses Final  diagnoses:  Hypertensive emergency  Hypertensive encephalopathy    Rx / DC Orders ED Discharge Orders     None        Lennice Sites, DO 10/09/21 1819

## 2021-10-10 ENCOUNTER — Observation Stay (HOSPITAL_COMMUNITY): Payer: Medicare Other

## 2021-10-10 ENCOUNTER — Inpatient Hospital Stay (HOSPITAL_COMMUNITY): Payer: Medicare Other

## 2021-10-10 DIAGNOSIS — R202 Paresthesia of skin: Secondary | ICD-10-CM | POA: Diagnosis present

## 2021-10-10 DIAGNOSIS — E785 Hyperlipidemia, unspecified: Secondary | ICD-10-CM | POA: Diagnosis present

## 2021-10-10 DIAGNOSIS — Z79899 Other long term (current) drug therapy: Secondary | ICD-10-CM | POA: Diagnosis not present

## 2021-10-10 DIAGNOSIS — G9341 Metabolic encephalopathy: Secondary | ICD-10-CM

## 2021-10-10 DIAGNOSIS — I6389 Other cerebral infarction: Secondary | ICD-10-CM | POA: Diagnosis not present

## 2021-10-10 DIAGNOSIS — I639 Cerebral infarction, unspecified: Secondary | ICD-10-CM | POA: Diagnosis not present

## 2021-10-10 DIAGNOSIS — R569 Unspecified convulsions: Secondary | ICD-10-CM

## 2021-10-10 DIAGNOSIS — I2510 Atherosclerotic heart disease of native coronary artery without angina pectoris: Secondary | ICD-10-CM | POA: Diagnosis present

## 2021-10-10 DIAGNOSIS — I959 Hypotension, unspecified: Secondary | ICD-10-CM | POA: Diagnosis present

## 2021-10-10 DIAGNOSIS — I63531 Cerebral infarction due to unspecified occlusion or stenosis of right posterior cerebral artery: Secondary | ICD-10-CM | POA: Diagnosis present

## 2021-10-10 DIAGNOSIS — I10 Essential (primary) hypertension: Secondary | ICD-10-CM | POA: Diagnosis present

## 2021-10-10 DIAGNOSIS — R29700 NIHSS score 0: Secondary | ICD-10-CM | POA: Diagnosis present

## 2021-10-10 DIAGNOSIS — I6330 Cerebral infarction due to thrombosis of unspecified cerebral artery: Secondary | ICD-10-CM | POA: Insufficient documentation

## 2021-10-10 DIAGNOSIS — I6621 Occlusion and stenosis of right posterior cerebral artery: Secondary | ICD-10-CM | POA: Diagnosis not present

## 2021-10-10 DIAGNOSIS — R4182 Altered mental status, unspecified: Secondary | ICD-10-CM | POA: Diagnosis not present

## 2021-10-10 DIAGNOSIS — I674 Hypertensive encephalopathy: Secondary | ICD-10-CM | POA: Diagnosis present

## 2021-10-10 DIAGNOSIS — Z20822 Contact with and (suspected) exposure to covid-19: Secondary | ICD-10-CM | POA: Diagnosis present

## 2021-10-10 DIAGNOSIS — G4733 Obstructive sleep apnea (adult) (pediatric): Secondary | ICD-10-CM | POA: Diagnosis present

## 2021-10-10 DIAGNOSIS — I161 Hypertensive emergency: Secondary | ICD-10-CM | POA: Diagnosis present

## 2021-10-10 LAB — BASIC METABOLIC PANEL
Anion gap: 8 (ref 5–15)
BUN: 8 mg/dL (ref 8–23)
CO2: 25 mmol/L (ref 22–32)
Calcium: 9.3 mg/dL (ref 8.9–10.3)
Chloride: 105 mmol/L (ref 98–111)
Creatinine, Ser: 1.2 mg/dL — ABNORMAL HIGH (ref 0.44–1.00)
GFR, Estimated: 48 mL/min — ABNORMAL LOW (ref >60)
Glucose, Bld: 95 mg/dL (ref 70–99)
Potassium: 3.2 mmol/L — ABNORMAL LOW (ref 3.5–5.1)
Sodium: 138 mmol/L (ref 135–145)

## 2021-10-10 LAB — HEMOGLOBIN A1C
Hgb A1c MFr Bld: 5.7 % — ABNORMAL HIGH (ref 4.8–5.6)
Mean Plasma Glucose: 116.89 mg/dL

## 2021-10-10 LAB — ECHOCARDIOGRAM COMPLETE
Area-P 1/2: 2.76 cm2
Radius: 0.6 cm
S' Lateral: 2.4 cm

## 2021-10-10 IMAGING — MR MR HEAD WO/W CM
16 of 18 series · 40 of 48 positions shown · IV contrast (gadavist)
Comparison: No prior MRI, correlation is made with CT [DATE].

CLINICAL DATA: Mental status change, unknown cause

EXAM:
MRI HEAD WITHOUT AND WITH CONTRAST
TECHNIQUE: Multiplanar, multiecho pulse sequences of the brain and surrounding
structures were obtained without and with intravenous contrast.
CONTRAST:  8.5mL GADAVIST GADOBUTROL 1 MMOL/ML IV SOLN

[Series 5: DWI · axial · 3.0mm · 0.88mm/px · z∈[-66,+81]mm · 5 of 100 slices shown (1 of 6)]
[im 1/100]
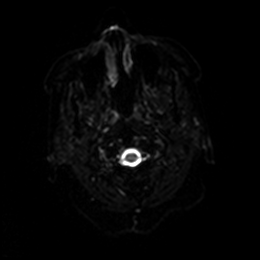
[im 25/100]
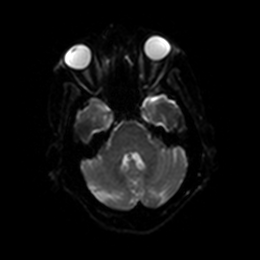
[im 50/100]
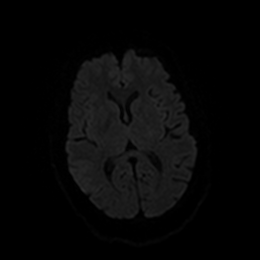
[im 75/100]
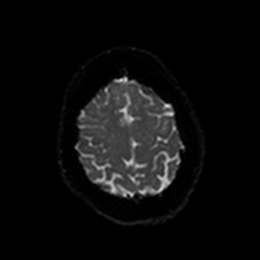
[im 100/100]
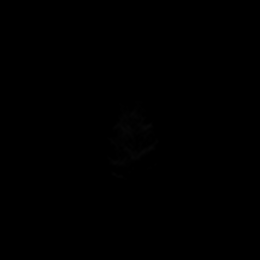

[Series 6: DWI · axial · 3.0mm · 0.88mm/px · z∈[-66,+81]mm · 2 of 50 slices shown (2 of 6)]
[im 1/50]
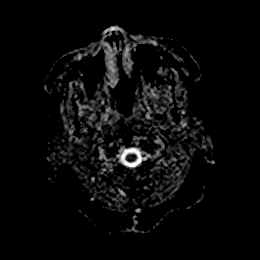
[im 50/50]
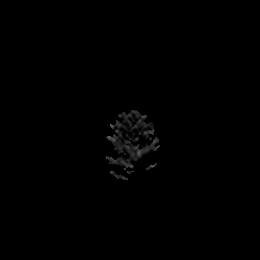

[Series 8: pha_images · axial · 3.0mm · 0.90mm/px · z∈[-80,+97]mm · 4 of 60 slices shown]
[im 1/60]
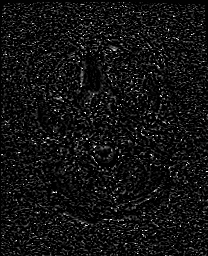
[im 20/60]
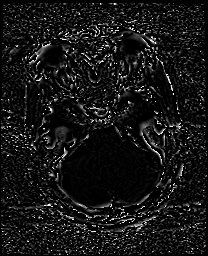
[im 40/60]
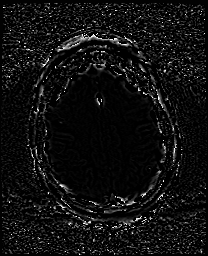
[im 60/60]
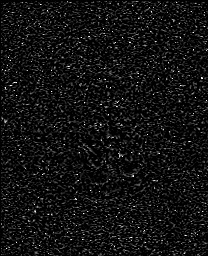

[Series 9: swi_images · axial · 3.0mm · 0.90mm/px · z∈[-80,+97]mm · 4 of 60 slices shown]
[im 1/60]
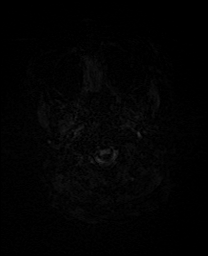
[im 20/60]
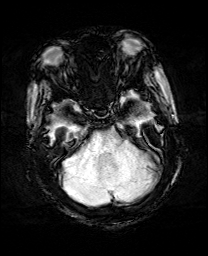
[im 40/60]
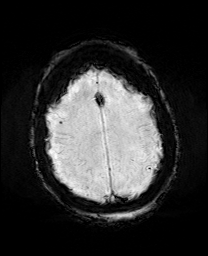
[im 60/60]
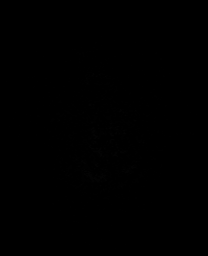

[Series 11: T2 · axial · 5.0mm · 0.72mm/px · z∈[-64,+79]mm · 2 of 25 slices shown]
[im 1/25]
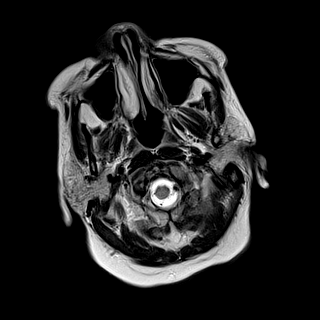
[im 25/25]
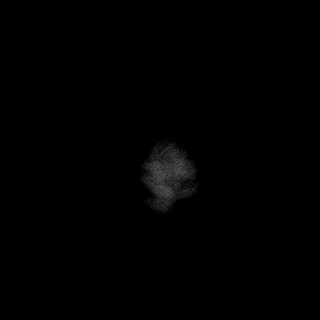

[Series 12: DWI · coronal · 4.0mm · 0.88mm/px · 4 of 64 slices shown (3 of 6)]
[im 1/64]
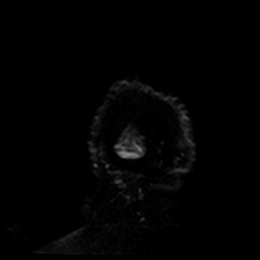
[im 22/64]
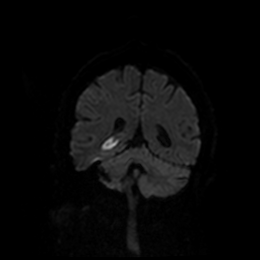
[im 43/64]
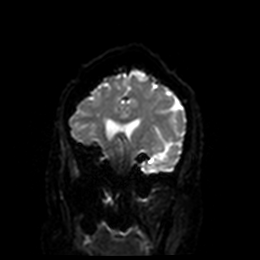
[im 64/64]
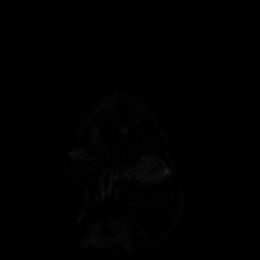

[Series 13: DWI · coronal · 4.0mm · 0.88mm/px · 2 of 32 slices shown (4 of 6)]
[im 1/32]
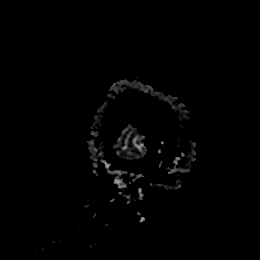
[im 32/32]
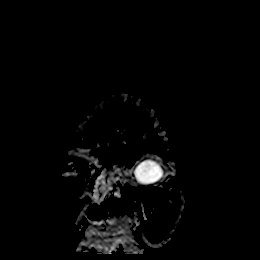

[Series 14: T1 · sagittal · 5.0mm · 0.75mm/px · 1 of 23 slices shown (1 of 2)]
[im 1/23]
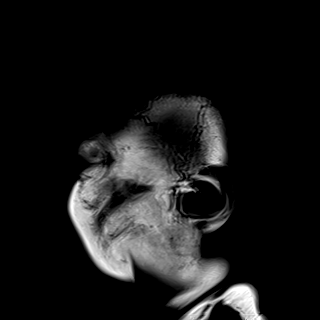

[Series 15: FLAIR · axial · 5.0mm · 0.45mm/px · z∈[-64,+79]mm · 2 of 25 slices shown]
[im 1/25]
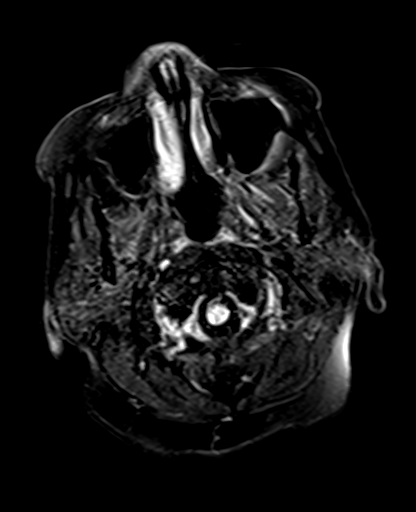
[im 25/25]
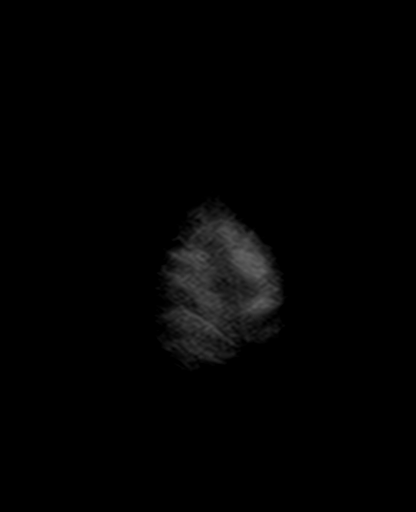

[Series 16: T1 · sagittal · 5.0mm · 0.75mm/px · 1 of 23 slices shown (2 of 2)]
[im 1/23]
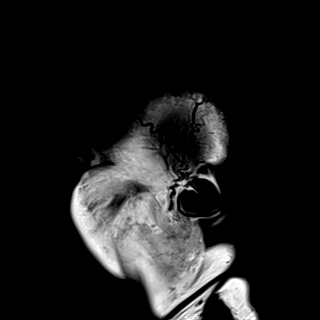

[Series 18: DWI · coronal · 4.0mm · 0.88mm/px · 4 of 64 slices shown (5 of 6)]
[im 1/64]
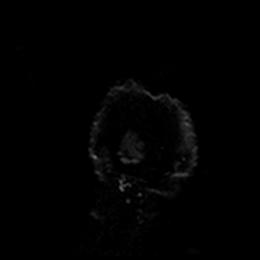
[im 22/64]
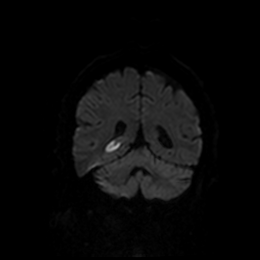
[im 43/64]
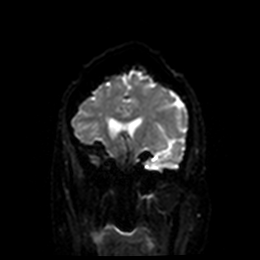
[im 64/64]
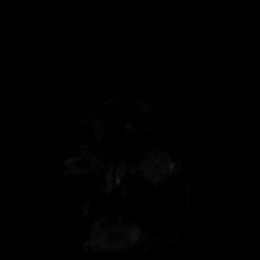

[Series 19: DWI · coronal · 4.0mm · 0.88mm/px · 2 of 32 slices shown (6 of 6)]
[im 1/32]
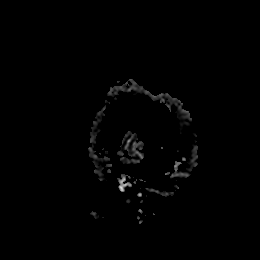
[im 32/32]
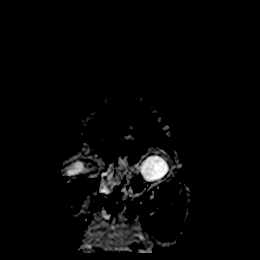

[Series 20: T2 post-contrast · coronal · 5.0mm · 0.72mm/px · 2 of 28 slices shown]
[im 1/28]
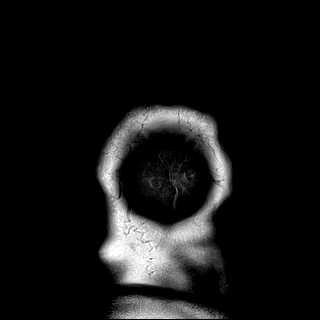
[im 28/28]
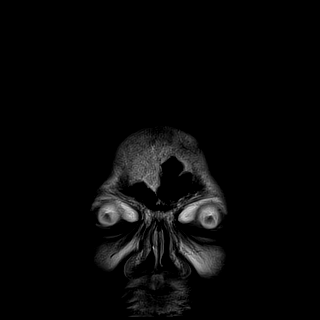

[Series 22: T1 post-contrast · coronal · 5.0mm · 0.34mm/px · 2 of 28 slices shown (1 of 3)]
[im 1/28]
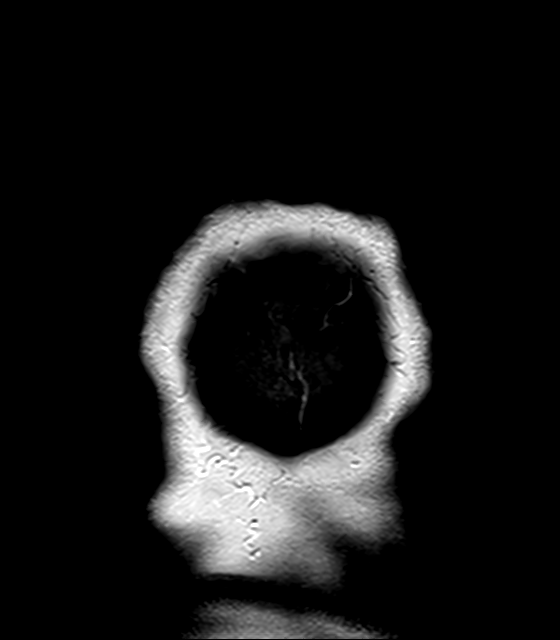
[im 28/28]
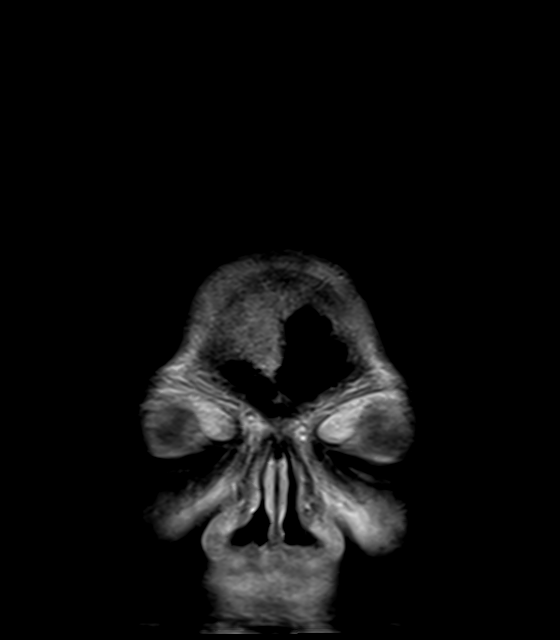

[Series 23: T1 post-contrast · sagittal · 5.0mm · 0.72mm/px · 1 of 23 slices shown (2 of 3)]
[im 1/23]
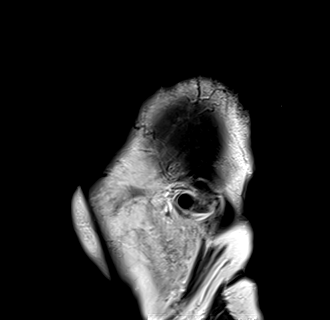

[Series 24: T1 post-contrast · coronal · 5.0mm · 0.34mm/px · 2 of 28 slices shown (3 of 3)]
[im 1/28]
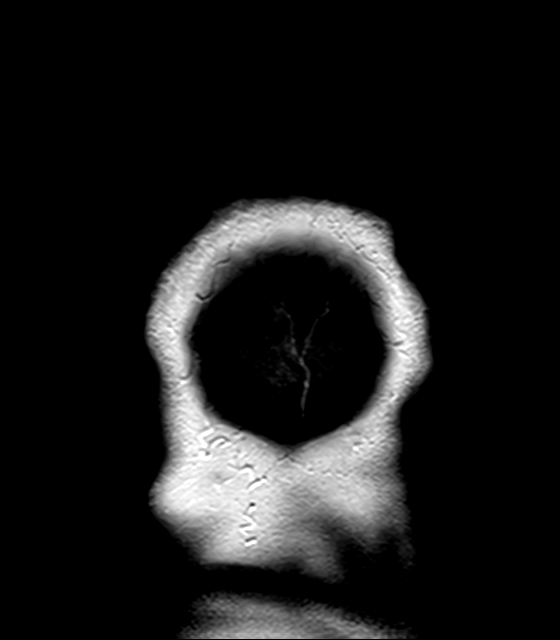
[im 28/28]
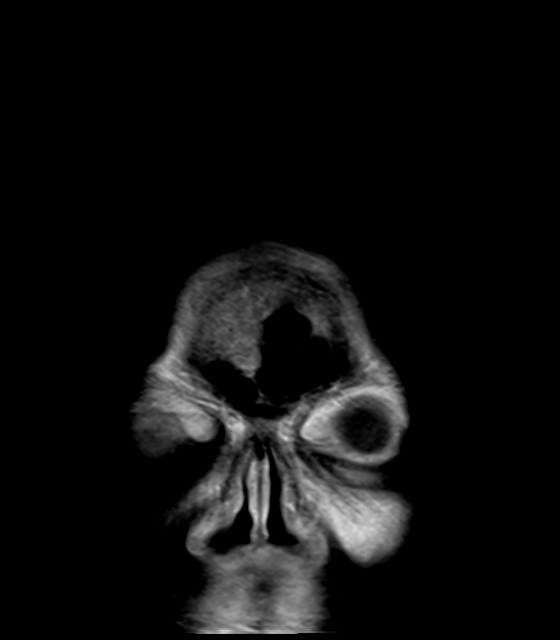

[40 of 48 positions shown; findings below may reference images not displayed]

FINDINGS: Evaluation is somewhat limited by motion artifact.

Brain: Restricted diffusion with ADC correlate in the right
hippocampus, extending from anterior to posterior (series 5, images
65-73). This area is associated with mildly increased T2 signal,
likely cytotoxic edema, but no significant contrast enhancement.

Along the anterior right temporal lobe/inferior right frontal lobe,
there is a 0.8 x 0.9 x 0.7 cm enhancing lesion (AP x TR x CC)
(series 21, image 26 and series 23, image 7). This is somewhat
poorly evaluated given motion artifact affecting several sequences,
but is favored to be extra-axial, as a CSF cleft is suggested on
coronal T2 sequence (series 20, image 19).

No acute hemorrhage, mass effect, or midline shift. No hydrocephalus
or extra-axial collection. Scattered T2 hyperintense signal in the
periventricular white matter, likely the sequela of mild chronic
small vessel ischemic disease.

Vascular: Normal flow voids.

Skull and upper cervical spine: Normal marrow signal.

Sinuses/Orbits: Mucosal thickening in the anterior ethmoid air
cells. The orbits are unremarkable.

Other: Trace fluid in the mastoid air cells.
IMPRESSION: 1. Evaluation is somewhat limited by motion artifact. Within this
limitation, there is an acute infarct extending from the anterior to
posterior right hippocampus.
2. Subcentimeter enhancing lesion along the right inferior frontal
lobe, favored to be extradural, although evaluation is somewhat
limited by motion artifact.

These results were called by telephone at the time of interpretation
on [DATE] at [DATE] to provider Dr. GREFG , who verbally
acknowledged these results.

## 2021-10-10 IMAGING — MR MR MRA HEAD W/O CM
2 series · 19 of 48 positions shown · non-contrast
Comparison: Brain MRI obtained earlier the same day, CT head
obtained 1 day prior

CLINICAL DATA: Infarcts seen on prior MRI

EXAM:
MRA HEAD WITHOUT CONTRAST
TECHNIQUE: Angiographic images of the Circle of Willis were acquired using MRA
technique without intravenous contrast.

[Series 2: ax (id) · axial · 1.0mm · 0.43mm/px · z∈[-93,-9]mm · 18 of 176 slices shown]
[im 1/176]
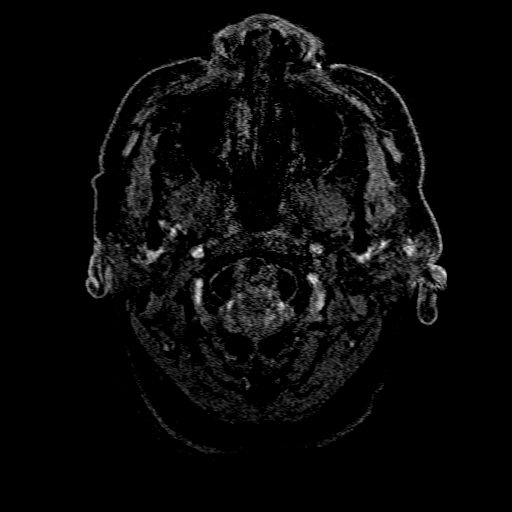
[im 4/176]
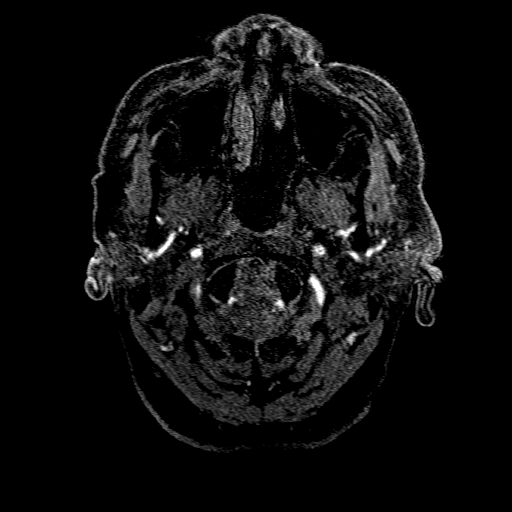
[im 8/176]
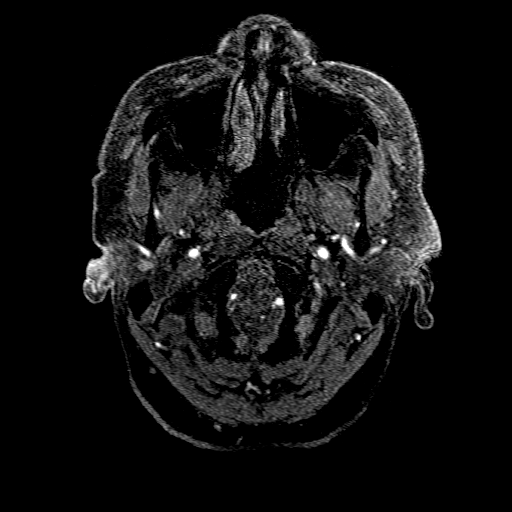
[im 12/176]
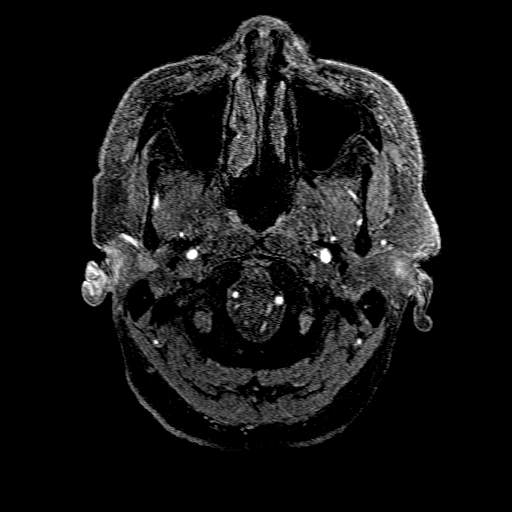
[im 16/176]
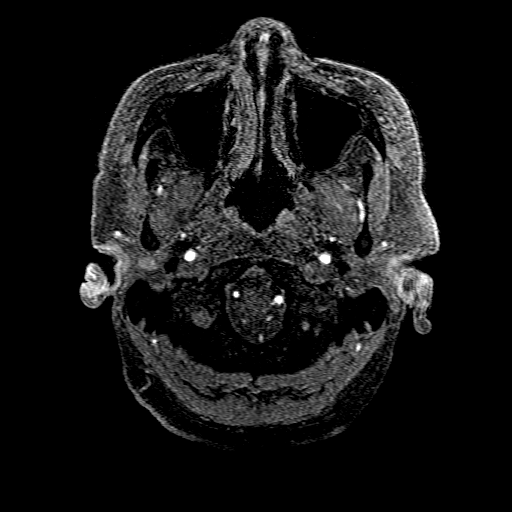
[im 20/176]
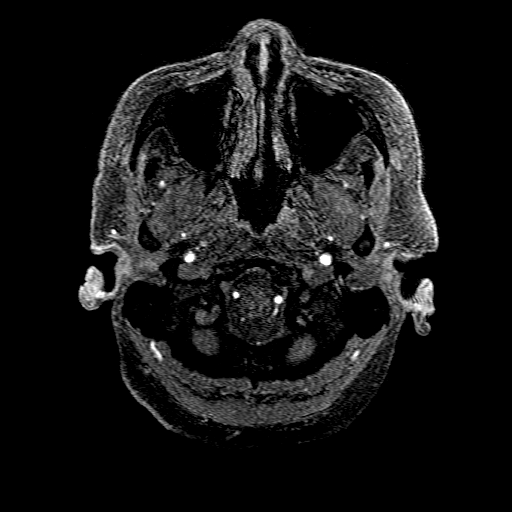
[im 23/176]
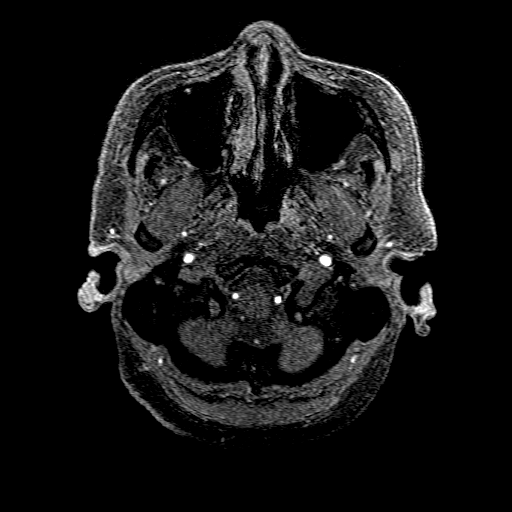
[im 27/176]
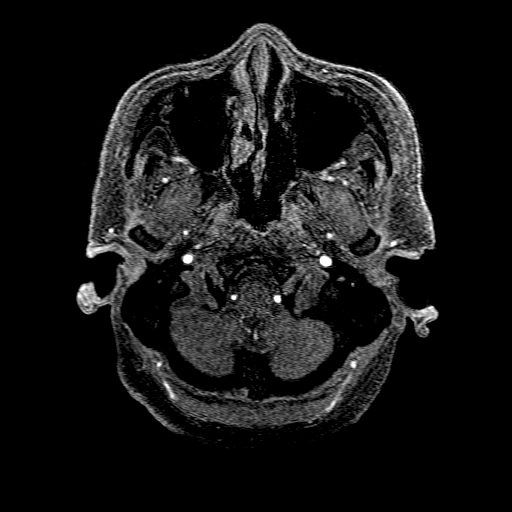
[im 31/176]
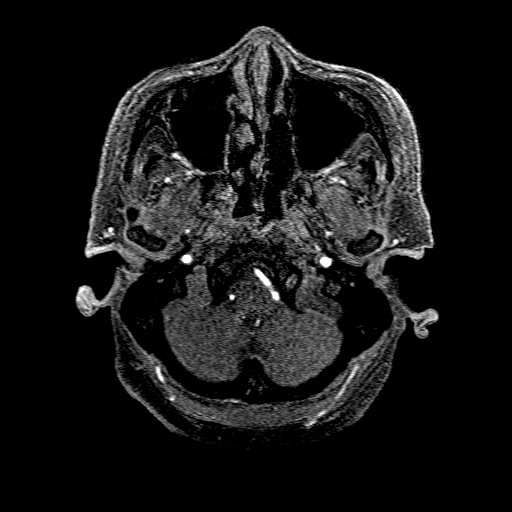
[im 35/176]
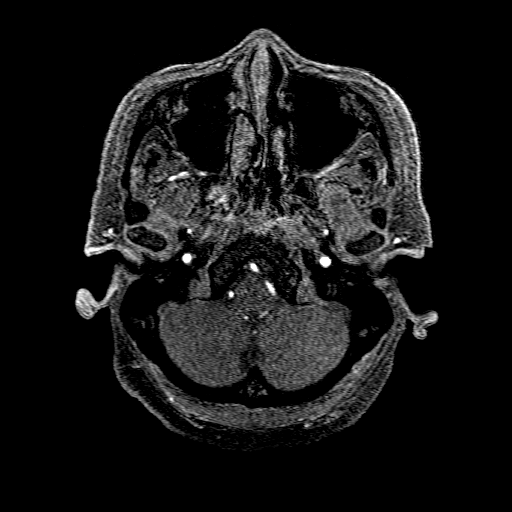
[im 54/176]
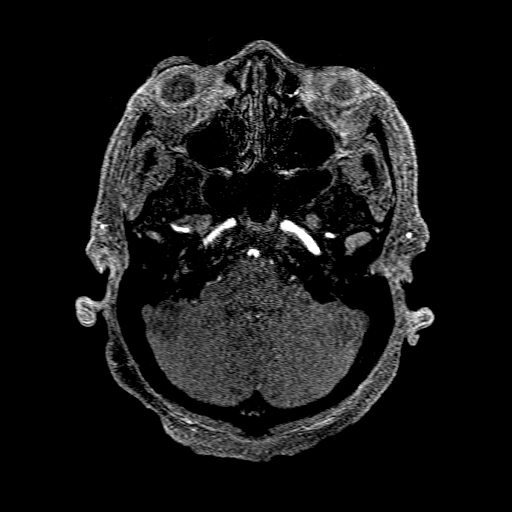
[im 77/176]
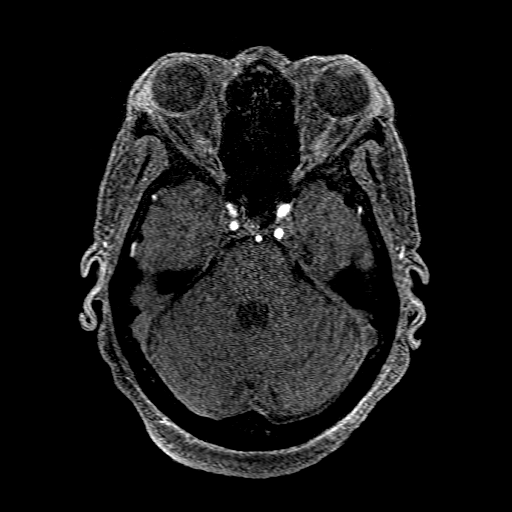
[im 88/176]
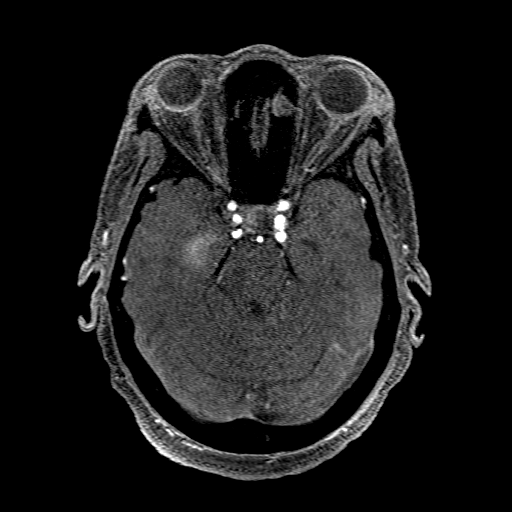
[im 99/176]
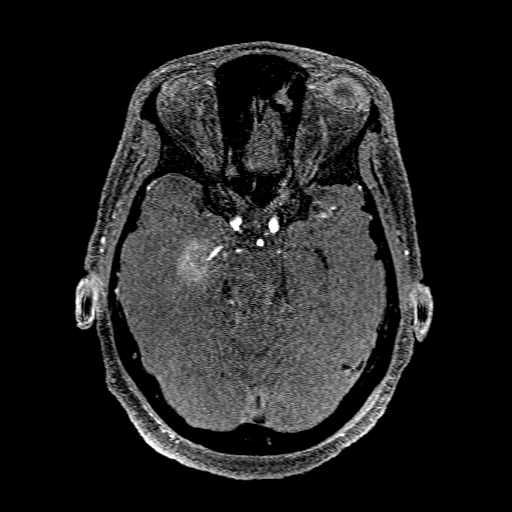
[im 122/176]
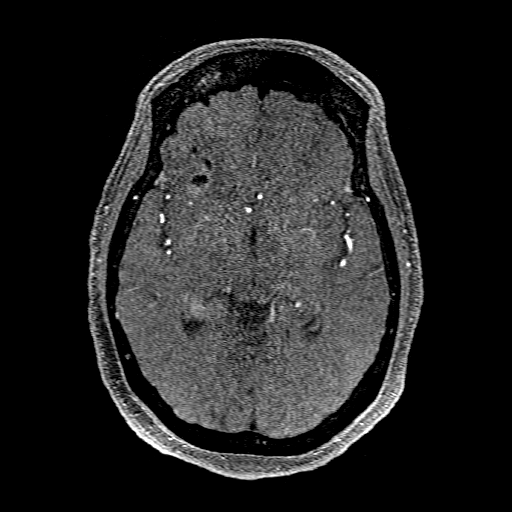
[im 145/176]
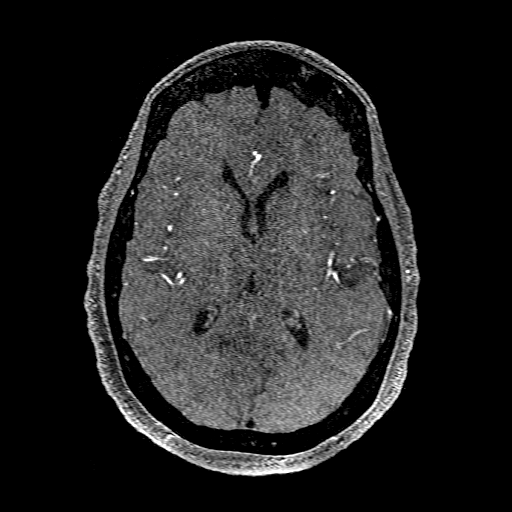
[im 149/176]
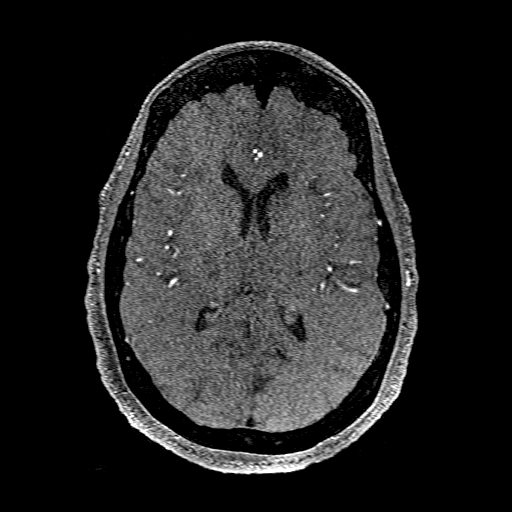
[im 168/176]
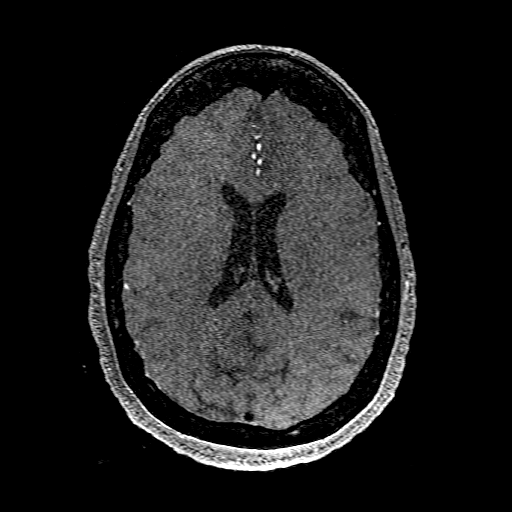

[Series 201: pjn:ax (id) · sagittal · 1.0mm · 0.43mm/px · 1 of 3 slices shown]
[im 1/3]
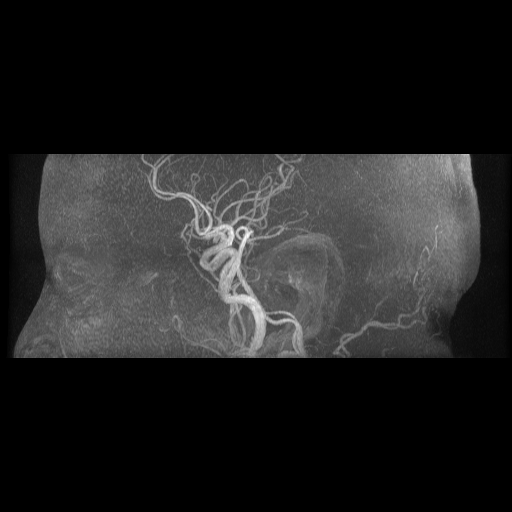

[19 of 48 positions shown; findings below may reference images not displayed]

FINDINGS: Anterior circulation: The intracranial ICAs are patent.

The bilateral MCAs are patent.

The right A1 segment is hypoplastic or absent, a normal variant. The
left A1 segment is patent. The bilateral ACAs are otherwise patent.
The anterior communicating artery is patent.

There is no aneurysm.

Posterior circulation: The bilateral V4 segments are patent. The
basilar artery is patent.

There is multifocal irregularity of the right P1 and P2 segments.
There is focal severe stenosis of the proximal right P2 segment
(2-100) and focal severe stenosis more distally in the right ambient
cistern (2-121). The distal right PCA is slightly diminished in
caliber compared to the left. The left PCA is patent. A small left
posterior communicating artery is identified. The right posterior
communicating artery is not definitely seen

There is no aneurysm

Anatomic variants: As above.

Other: None.
IMPRESSION: 1. Two focal severe stenoses of the right P2 segment with slightly
diminished caliber of the distal right PCA branches compared to the
left.
2. Otherwise, patent intracranial vasculature.

## 2021-10-10 MED ORDER — LORAZEPAM 2 MG/ML IJ SOLN
1.0000 mg | Freq: Once | INTRAMUSCULAR | Status: AC
  Administered 2021-10-10: 02:00:00 1 mg via INTRAVENOUS
  Filled 2021-10-10: qty 1

## 2021-10-10 MED ORDER — POTASSIUM CHLORIDE CRYS ER 20 MEQ PO TBCR
40.0000 meq | EXTENDED_RELEASE_TABLET | Freq: Once | ORAL | Status: AC
  Administered 2021-10-10: 03:00:00 40 meq via ORAL
  Filled 2021-10-10: qty 2

## 2021-10-10 MED ORDER — LISINOPRIL 20 MG PO TABS: 20.0000 mg | ORAL_TABLET | Freq: Every day | ORAL | Status: DC

## 2021-10-10 MED ORDER — STROKE: EARLY STAGES OF RECOVERY BOOK
Freq: Once | Status: AC
  Filled 2021-10-10: qty 1

## 2021-10-10 MED ORDER — POTASSIUM CHLORIDE CRYS ER 20 MEQ PO TBCR
40.0000 meq | EXTENDED_RELEASE_TABLET | Freq: Once | ORAL | Status: AC
  Administered 2021-10-10: 11:00:00 40 meq via ORAL
  Filled 2021-10-10: qty 2

## 2021-10-10 MED ORDER — GADOBUTROL 1 MMOL/ML IV SOLN
8.5000 mL | Freq: Once | INTRAVENOUS | Status: AC | PRN
  Administered 2021-10-10: 03:00:00 8.5 mL via INTRAVENOUS

## 2021-10-10 MED ORDER — CLOPIDOGREL BISULFATE 75 MG PO TABS
75.0000 mg | ORAL_TABLET | Freq: Every day | ORAL | Status: DC
  Administered 2021-10-10 – 2021-10-11 (×2): 75 mg via ORAL
  Filled 2021-10-10 (×2): qty 1

## 2021-10-10 MED ORDER — LORAZEPAM 2 MG/ML IJ SOLN
0.5000 mg | Freq: Once | INTRAMUSCULAR | Status: DC | PRN
  Filled 2021-10-10: qty 1

## 2021-10-10 MED ORDER — AMLODIPINE BESYLATE 2.5 MG PO TABS: 2.5000 mg | ORAL_TABLET | Freq: Every day | ORAL | Status: DC

## 2021-10-10 MED ORDER — HYDROCHLOROTHIAZIDE 12.5 MG PO TABS: 12.5000 mg | ORAL_TABLET | Freq: Every day | ORAL | Status: DC

## 2021-10-10 MED ORDER — ATORVASTATIN CALCIUM 80 MG PO TABS
80.0000 mg | ORAL_TABLET | Freq: Every day | ORAL | Status: DC
  Administered 2021-10-10 – 2021-10-11 (×2): 80 mg via ORAL
  Filled 2021-10-10 (×2): qty 1

## 2021-10-10 MED ORDER — HYDRALAZINE HCL 20 MG/ML IJ SOLN: 5.0000 mg | Freq: Four times a day (QID) | INTRAMUSCULAR | Status: DC | PRN

## 2021-10-10 NOTE — Progress Notes (Signed)
°  Echocardiogram 2D Echocardiogram has been performed.  Darlene [name] 10/10/2021, 12:04 PM

## 2021-10-10 NOTE — Progress Notes (Signed)
VASCULAR LAB    Carotid duplex has been performed.  See CV proc for preliminary results.   Madailein Londo, RVT 10/10/2021, 2:23 PM

## 2021-10-10 NOTE — Progress Notes (Signed)
EEG done at bedside post MRI. No skin breakdown noted. Results pending.

## 2021-10-10 NOTE — Evaluation (Signed)
Physical Therapy Evaluation Patient Details Name: Darlene [name] MRN: [id number] DOB: 1951-03-17 Today's Date: 10/10/2021  History of Present Illness  70 y.o. female presents to [facility] on 10/09/2021 with persistent confusion and tingling in finger tips. 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 PMH includes HTN.  Clinical Impression  Pt presents to PT with deficits in memory, awareness, cognition, and mild balance deficits. Pt is able to mobilize without physical assistance, initially bumping into doorway but otherwise steady. Pt demonstrates significant memory deficits, requiring cues to recall room number and with multiple errors when attempting to find her way back to hospital room. Pt will benefit from continued acute PT services to further challenge balance and to continue providing dual-task cognitive assessments when mobilizing. Pt demonstrates no PT follow-up needs.  Pt will benefit from frequent supervision from family at the time of discharge to improve patient safety, especially when transitioning to a less familiar environment at her daughter's home.     Recommendations for follow up therapy are one component of a multi-disciplinary discharge planning process, led by the attending physician.  Recommendations may be updated based on patient status, additional functional criteria and insurance authorization.  Follow Up Recommendations No PT follow up    Assistance Recommended at Discharge Frequent or constant Supervision/Assistance (supervision for safety)  Functional Status Assessment Patient has had a recent decline in their functional status and demonstrates the ability to make significant improvements in function in a reasonable and predictable amount of time.  Equipment Recommendations  None recommended by PT    Recommendations for Other Services       Precautions / Restrictions  Precautions Precautions: Fall Restrictions Weight Bearing Restrictions: No      Mobility  Bed Mobility Overal bed mobility: Modified Independent                  Transfers Overall transfer level: Independent   Transfers: Sit to/from Stand Sit to Stand: Min guard                Ambulation/Gait Ambulation/Gait assistance: Supervision Gait Distance (Feet): 400 Feet Assistive device: None Gait Pattern/deviations: Step-through pattern Gait velocity: functional Gait velocity interpretation: >2.62 ft/sec, indicative of community ambulatory   General Gait Details: pt bumps into door with left shoulder when leaving room initially, otherwise steady step-through gait, navigating around objects in hallway  Stairs            Wheelchair Mobility    Modified Rankin (Stroke Patients Only) Modified Rankin (Stroke Patients Only) Pre-Morbid Rankin Score: No symptoms Modified Rankin: Moderately severe disability     Balance Overall balance assessment: Mild deficits observed, not formally tested                                           Pertinent Vitals/Pain Pain Assessment: No/denies pain    Home Living Family/patient expects to be discharged to:: Private residence Living Arrangements: Spouse/significant other;Children Available Help at Discharge: Available 24 hours/day Type of Home: House Home Access: Stairs to enter Entrance Stairs-Rails: None Entrance Stairs-Number of Steps: 2-3 Alternate Level Stairs-Number of Steps: 3 Home Layout: Multi-level Home Equipment: None      Prior Function Prior Level of Function : Independent/Modified Independent;Working/employed;Driving Conservation officer, nature at [employer], formerly worked at [employer])  Hand Dominance   Dominant Hand: Right    Extremity/Trunk Assessment   Upper Extremity Assessment Upper Extremity Assessment: Overall WFL for tasks assessed (slowed finger to nose  but accurate)    Lower Extremity Assessment Lower Extremity Assessment: Overall WFL for tasks assessed    Cervical / Trunk Assessment Cervical / Trunk Assessment: Normal  Communication   Communication: No difficulties  Cognition Arousal/Alertness: Lethargic (awakens with verbal stimuli, quickly nods off when sitting) Behavior During Therapy: Flat affect Overall Cognitive Status: Impaired/Different from baseline Area of Impairment: Orientation;Attention;Memory;Following commands;Safety/judgement;Awareness;Problem solving                 Orientation Level: Disoriented to;Time;Situation Current Attention Level: Sustained Memory: Decreased short-term memory Following Commands: Follows one step commands consistently;Follows multi-step commands inconsistently Safety/Judgement: Decreased awareness of safety;Decreased awareness of deficits Awareness: Intellectual Problem Solving: Slow processing;Difficulty sequencing General Comments: Scored a 0/5 on the Mini-cog indicating significnat cognitive impairment; however pt had Ativan @ 3am for MRI and per daughter had a restless night of sleep. Pt thought her daughter was her sister, even at end of session; dtr states her Mom was better yesterday than this am, however she was not back to  her baseline        General Comments General comments (skin integrity, edema, etc.): VSS on RA    Exercises     Assessment/Plan    PT Assessment Patient needs continued PT services  PT Problem List Decreased balance;Decreased cognition;Decreased safety awareness;Decreased knowledge of precautions       PT Treatment Interventions Gait training;Stair training;Balance training;Neuromuscular re-education;Patient/family education;Cognitive remediation    PT Goals (Current goals can be found in the Care Plan section)  Acute Rehab PT Goals Patient Stated Goal: to return to daughter's home in [place] PT Goal Formulation: With patient/family Time For  Goal Achievement: 10/24/21 Potential to Achieve Goals: Good Additional Goals Additional Goal #1: Pt will score >19/24 on DGI to indicate a reduced risk for falls Additional Goal #2: Pt will score >45/56 on BERG balance test to indicate a reduced risk for falls    Frequency Min 4X/week   Barriers to discharge        Co-evaluation               AM-PAC PT "6 Clicks" Mobility  Outcome Measure Help needed turning from your back to your side while in a flat bed without using bedrails?: None Help needed moving from lying on your back to sitting on the side of a flat bed without using bedrails?: None Help needed moving to and from a bed to a chair (including a wheelchair)?: None Help needed standing up from a chair using your arms (e.g., wheelchair or bedside chair)?: None Help needed to walk in hospital room?: A Little Help needed climbing 3-5 steps with a railing? : A Little 6 Click Score: 22    End of Session   Activity Tolerance: Patient tolerated treatment well Patient left: in chair;with call bell/phone within reach;with family/visitor present (daughter instructed to notify nursing if she is to leave room to allow for placement of chair alarm) Nurse Communication: Mobility status PT Visit Diagnosis: Other abnormalities of gait and mobility (R26.89)    Time: 6568-1275 PT Time Calculation (min) (ACUTE ONLY): 20 min   Charges:   PT Evaluation $PT Eval Low Complexity: 1 Low          Zenaida Niece, PT, DPT Acute Rehabilitation Pager: [phone] Office [phone]   Zenaida Niece 10/10/2021, 9:51 AM

## 2021-10-10 NOTE — Progress Notes (Signed)
EEG to be done following MRI. Cath in room doing study.

## 2021-10-10 NOTE — Plan of Care (Signed)

## 2021-10-10 NOTE — Progress Notes (Addendum)
STROKE TEAM PROGRESS NOTE   INTERVAL HISTORY Her daughter and husband are at the bedside.  Patient is sitting up in the chair.  When talking to her, she states that she does not notice that her memory has changed.  Her daughter noticed that things have changed.  Patient appears hemodynamically stable.  We discussed doing an EEG and starting aspirin, Plavix, and atorvastatin.  MRA ordered, may repeat MRI depending on MRA results because initial MRI has motion artifact.  MRI brain shows acute infarct involving right hippocampus and postcontrast images suggest enhancing lesion in the right frontal lobe in the perisylvian region possibly meningioma which is incompletely visualized.  Vitals:   10/10/21 0300 10/10/21 0400 10/10/21 0500 10/10/21 0805  BP: (!) 158/60 (!) 174/54 (!) 182/156 (!) 154/53  Pulse:    (!) 50  Resp: (!) 24  (!) 25 20  Temp: 98.6 F (37 C) 98.6 F (37 C)  98.6 F (37 C)  TempSrc: Oral Oral  Oral  SpO2:    97%   CBC:  Recent Labs  Lab 10/09/21 1503  WBC 6.8  HGB 12.1  HCT 38.3  MCV 96.5  PLT 119   Basic Metabolic Panel:  Recent Labs  Lab 10/09/21 1503 10/10/21 0015  NA 139 138  K 3.6 3.2*  CL 106 105  CO2 28 25  GLUCOSE 108* 95  BUN 9 8  CREATININE 1.26* 1.20*  CALCIUM 9.3 9.3   Lipid Panel:  Recent Labs  Lab 10/09/21 1827  CHOL 208*  TRIG 59  HDL 41  CHOLHDL 5.1  VLDL 12  LDLCALC 155*   HgbA1c:  Recent Labs  Lab 10/10/21 0500  HGBA1C 5.7*   Urine Drug Screen: No results for input(s): LABOPIA, COCAINSCRNUR, LABBENZ, AMPHETMU, THCU, LABBARB in the last 168 hours.  Alcohol Level No results for input(s): ETH in the last 168 hours.  IMAGING past 24 hours CT HEAD WO CONTRAST (5MM)  Result Date: 10/09/2021 CLINICAL DATA:  Confusion. Head trauma. Elevated blood pressure. See this is wider than EXAM: CT HEAD WITHOUT CONTRAST TECHNIQUE: Contiguous axial images were obtained from the base of the skull through the vertex without intravenous  contrast. COMPARISON:  None. FINDINGS: Brain: No evidence of acute infarction, hemorrhage, hydrocephalus, extra-axial collection or mass lesion/mass effect. Asymmetric right hippocampal atrophy with associated mild dilatation of the temporal horn of the right lateral ventricle. Vascular: No hyperdense vessel or unexpected calcification. Skull: Negative for fracture.  Hyperostosis frontalis interna. Sinuses/Orbits: No acute finding. Other: None. IMPRESSION: No acute intracranial abnormality. Electronically Signed   By: Ileana Roup M.D.   On: 10/09/2021 16:50   MR BRAIN W WO CONTRAST  Result Date: 10/10/2021 CLINICAL DATA:  Mental status change, unknown cause EXAM: MRI HEAD WITHOUT AND WITH CONTRAST TECHNIQUE: Multiplanar, multiecho pulse sequences of the brain and surrounding structures were obtained without and with intravenous contrast. CONTRAST:  8.58mL GADAVIST GADOBUTROL 1 MMOL/ML IV SOLN COMPARISON:  No prior MRI, correlation is made with CT 10/09/2021. FINDINGS: Evaluation is somewhat limited by motion artifact. Brain: Restricted diffusion with ADC correlate in the right hippocampus, extending from anterior to posterior (series 5, images 65-73). This area is associated with mildly increased T2 signal, likely cytotoxic edema, but no significant contrast enhancement. Along the anterior right temporal lobe/inferior right frontal lobe, there is a 0.8 x 0.9 x 0.7 cm enhancing lesion (AP x TR x CC) (series 21, image 26 and series 23, image 7). This is somewhat poorly evaluated given motion artifact affecting  several sequences, but is favored to be extra-axial, as a CSF cleft is suggested on coronal T2 sequence (series 20, image 19). No acute hemorrhage, mass effect, or midline shift. No hydrocephalus or extra-axial collection. Scattered T2 hyperintense signal in the periventricular white matter, likely the sequela of mild chronic small vessel ischemic disease. Vascular: Normal flow voids. Skull and upper  cervical spine: Normal marrow signal. Sinuses/Orbits: Mucosal thickening in the anterior ethmoid air cells. The orbits are unremarkable. Other: Trace fluid in the mastoid air cells. IMPRESSION: 1. Evaluation is somewhat limited by motion artifact. Within this limitation, there is an acute infarct extending from the anterior to posterior right hippocampus. 2. Subcentimeter enhancing lesion along the right inferior frontal lobe, favored to be extradural, although evaluation is somewhat limited by motion artifact. These results were called by telephone at the time of interpretation on 10/10/2021 at 3:08 am to provider Dr. Fabio Neighbors , who verbally acknowledged these results. Electronically Signed   By: Merilyn Baba M.D.   On: 10/10/2021 03:09   DG Chest Portable 1 View  Result Date: 10/09/2021 CLINICAL DATA:  Confusion.  Recent fall. EXAM: PORTABLE CHEST 1 VIEW COMPARISON:  None. FINDINGS: Of note, the patient is mildly rotated on this portable examination. Borderline enlarged cardiac silhouette. Both lungs are clear. Multilevel degenerative changes in the mid and distal thoracic spine. No acute osseous abnormality. IMPRESSION: No active disease. Electronically Signed   By: Ileana Roup M.D.   On: 10/09/2021 16:27    PHYSICAL EXAM  Temp:  [98.4 F (36.9 C)-98.9 F (37.2 C)] 98.6 F (37 C) (12/18 0805) Pulse Rate:  [44-66] 50 (12/18 0805) Resp:  [12-26] 20 (12/18 0805) BP: (139-231)/(48-156) 154/53 (12/18 0805) SpO2:  [94 %-100 %] 97 % (12/18 0805)  General - Well nourished, well developed, elderly lady in no apparent distress. Ophthalmologic - fundi not visualized due to noncooperation. Cardiovascular - Regular rhythm and rate.  Mental Status -  Level of arousal and orientation to place and person were intact.Patient was not oriented to time. Short term memory is not intact with some impairment of the long term memory as well.  She was able to do basic addition, but she was not able to remember 3  words that were told to her.  She stated that she did not remember being given the instruction to remember the 3 words.  She was able to name only 6 animals which can walk on 4 legs.  Cranial Nerves II - XII - II - Visual acuity intact OU.  Visual fields full III, IV, VI - Extraocular movements intact. V - Facial sensation intact bilaterally. VII - Facial movement intact bilaterally. VIII - Hearing & vestibular intact bilaterally. X - Palate elevates symmetrically. XI - Chin turning & shoulder shrug intact bilaterally. XII - Tongue protrusion intact.  Motor Strength - The patients strength was normal in all extremities and pronator drift was absent.  Bulk was normal and fasciculations were absent.   Motor Tone - Muscle tone was assessed at the neck and appendages and was normal. Reflexes - The patients reflexes were symmetrical in all extremities and she had no pathological reflexes. Sensory - Light touch, temperature/pinprick were assessed and were symmetrical.   Coordination - The patient had normal movements in the hands and feet with no ataxia or dysmetria.  Tremor was absent. Gait and Station - deferred.   ASSESSMENT/PLAN [name] is a 70 y.o. female with history of hypertension presenting with 3 days of confusion, but  no difficulty speaking.  Her daughter reports that she was on the phone with the patient about 3 days ago noted that she was not making sense.  She was talking about calling her boss at [employer] to let them know she would not be able to come to work, however she has not worked there for years.  Her daughter flew down from [place] to check on her and bring her to the ED.  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  Her MRI was motion degraded, MRA pending.  We may repeat the MRI depending on the results of the MRA.  An EEG has also been ordered.  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  Enhancing lesion anterior right temporal/frontal lobe ?  Meningioma Code Stroke -CT head  Asymmetric right hippocampal atrophy  MRI-  acute infarct extending from the anterior to posterior right hippocampus MRA pending Carotid Doppler pending 2D Echo pending LDL 155 HgbA1c 5.7 VTE prophylaxis -Lovenox 40 mg No antithrombotic prior to admission, now on aspirin 81 mg daily and clopidogrel 75 mg daily.  For 3 weeks followed by aspirin alone Therapy recommendations: No PT follow-up recommended, outpatient neuro OT Disposition: Pending  Hypertension Home meds: Lisinopril- Hydrochlorothiazide 20-12.5 mg Stable Long-term BP goal normotensive As needed hydralazine ordered for blood pressure greater than 180  Hyperlipidemia Home meds: None LDL 155, goal < 70 Add atorvastatin 80 mg High intensity statin  Continue statin at discharge  Other Stroke Risk Factors Advanced Age >/= 41  Obesity, There is no height or weight on file to calculate BMI., BMI >/= 30 associated with increased stroke risk, recommend weight loss, diet and exercise as appropriate  Coronary artery disease Migraines Obstructive sleep apnea, on CPAP at home Congestive heart failure  Other Active Problems Bradycardia Primary team holding atenolol  Hospital day # 0  Patient seen and examined by NP/APP with MD. MD to update note as needed.   Janine Ores, DNP, FNP-BC [facility] Pager: [phone]  STROKE MD NOTE :  I have personally obtained history,examined this patient, reviewed notes, independently viewed imaging studies, participated in medical decision making and plan of care.ROS completed by me personally and pertinent positives fully documented  I have made any additions or clarifications directly to the above note. Agree with note above.  Patient presented with 3-day history of confusion and memory loss due to right hippocampal infarct likely right  posterior cerebral artery embolic infarct source.  MRI also shows 0.8 x 1 cm enhancing inferior right frontal/temporal lesion possibly meningioma which is incompletely visualized present scan may be incidental.  Recommend continue ongoing stroke work-up.  Cardiac monitoring she may need loop recorder at discharge.  Check MR angiogram of the brain.  Aspirin and Plavix for stroke prevention for 3 weeks followed by aspirin alone.  Long discussion with patient and daughter at the bedside and answered questions.  Discussed with Dr.Dawood.  Greater than 50% time during this 35-minute visit for spent on counseling and coordination of care about her embolic stroke and possible meningioma and answering questions.  Antony Contras, MD Medical Director [facility] Pager: [phone] 10/10/2021 2:35 PM  To contact Stroke Continuity provider, please refer to [email or web address] After hours, contact General Neurology

## 2021-10-10 NOTE — Progress Notes (Signed)
Just returned from MRI

## 2021-10-10 NOTE — Evaluation (Signed)
Clinical/Bedside Swallow Evaluation Patient Details  Name: Darlene [name] MRN: [id number] Date of Birth: 1951/10/14  Today's Date: 10/10/2021 Time: SLP Start Time (ACUTE ONLY): 1032 SLP Stop Time (ACUTE ONLY): 1044 SLP Time Calculation (min) (ACUTE ONLY): 12 min  Past Medical History:  Past Medical History:  Diagnosis Date   Hypertension    Past Surgical History: History reviewed. No pertinent surgical history. HPI:  Pt is a 70 y.o. female who presented with 3 day history of confusion. 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 Subcentimeter enhancing lesion along the right inferior frontal lobe, favored to be extradural. PMH: HTN    Assessment / Plan / Recommendation  Clinical Impression  Pt was seen for bedside swallow evaluation with her husband and daughter present; all parties denied the pt having a history of dysphagia. Oral motor strength and ROM were WFL. Pt presented with reduced dentition and stated that she wears dentures, but that she does not eat with them. She tolerated all solids and liquids without signs or symptoms of oropharyngeal dysphagia. A regular texture diet with thin liquids is recommended at this time and further skilled SLP services are not clinically indicated for swallowing. SLP Visit Diagnosis: Dysphagia, unspecified (R13.10)    Aspiration Risk  No limitations    Diet Recommendation Regular;Thin liquid   Liquid Administration via: Cup;Straw Medication Administration: Whole meds with liquid Supervision: Patient able to self feed Postural Changes: Seated upright at 90 degrees    Other  Recommendations Oral Care Recommendations: Oral care BID    Recommendations for follow up therapy are one component of a multi-disciplinary discharge planning process, led by the attending physician.  Recommendations may be updated based on patient status, additional functional criteria and insurance authorization.  Follow  up Recommendations        Assistance Recommended at Discharge    Functional Status Assessment    Frequency and Duration            Prognosis        Swallow Study   General Date of Onset: 10/09/21 HPI: Pt is a 70 y.o. female who presented with 3 day history of confusion. 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 Subcentimeter enhancing lesion along the right inferior frontal lobe, favored to be extradural. PMH: HTN Type of Study: Bedside Swallow Evaluation Previous Swallow Assessment: none Diet Prior to this Study: Regular;Thin liquids Temperature Spikes Noted: No Respiratory Status: Non-rebreather History of Recent Intubation: No Behavior/Cognition: Alert;Cooperative;Pleasant mood Oral Cavity Assessment: Within Functional Limits Oral Care Completed by SLP: No Oral Cavity - Dentition: Missing dentition (dentures not at hospital, but are reportedly are not used during meals) Vision: Functional for self-feeding Self-Feeding Abilities: Needs assist Patient Positioning: Upright in chair;Postural control adequate for testing Baseline Vocal Quality: Normal Volitional Cough: Strong Volitional Swallow: Able to elicit    Oral/Motor/Sensory Function Overall Oral Motor/Sensory Function: Within functional limits   Ice Chips Ice chips: Within functional limits Presentation: Spoon   Thin Liquid Thin Liquid: Within functional limits Presentation: Straw    Nectar Thick Nectar Thick Liquid: Not tested   Honey Thick Honey Thick Liquid: Not tested   Puree Puree: Within functional limits Presentation: Spoon   Solid     Solid: Within functional limits Presentation: [place] I. Hardin Negus, [place], [place] Office number [phone] Pager [phone]  Horton Marshall 10/10/2021,11:27 AM

## 2021-10-10 NOTE — Progress Notes (Addendum)
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  1) will hold PRN hydralazine 2) consulting neurology  Per neurology: BP goal = aim for around 180 (sounds like pt out of window for permissive HTN anyhow).

## 2021-10-10 NOTE — Consult Note (Signed)
NEUROLOGY CONSULTATION NOTE   Date of service: October 10, 2021 Patient Name: Darlene [name] MRN:  [id number] DOB:  [birth year] Reason for consult: "Confusion x 3 days" Requesting Provider: Lequita Halt, MD _ _ _   _ __   _ __ _ _  __ __   _ __   __ _  History of Present Illness  Darlene [name] is a 70 y.o. female with PMH significant for HTN who presents with 3 day history of confusion. Daughter reports that she was on phone with patient about 3 days ago and noted that she was not making much sense. She ws talking about calling to let her boss at [facility] [employer] know that she would not be able to come to work. She has left [employer] several years ago and works at [employer]. She seemed fine otherwise. She was still speaking about the same yesterday and today. Daughter flew in from [place] and brought her to the ED.   She was initially thought to have hypertensive urgency. Workup with MRI Brain with and without contrast demonstrated an infarct of the R hippocampus.  She has no prior history of stroke, does not smoke, no family history. No prior DM@, No hx of HLD.  mRS: 0. She is independent and worked at [employer] prior to this. tNKASE/Thrombectomy: outside window NIHSS components Score: Comment  1a Level of Conscious 0[x]  1[]  2[]  3[]      1b LOC Questions 0[x]  1[]  2[]       1c LOC Commands 0[x]  1[]  2[]       2 Best Gaze 0[x]  1[]  2[]       3 Visual 0[x]  1[]  2[]  3[]      4 Facial Palsy 0[x]  1[]  2[]  3[]      5a Motor Arm - left 0[x]  1[]  2[]  3[]  4[]  UN[]    5b Motor Arm - Right 0[x]  1[]  2[]  3[]  4[]  UN[]    6a Motor Leg - Left 0[x]  1[]  2[]  3[]  4[]  UN[]    6b Motor Leg - Right 0[x]  1[]  2[]  3[]  4[]  UN[]    7 Limb Ataxia 0[x]  1[]  2[]  3[]  UN[]     8 Sensory 0[x]  1[]  2[]  UN[]      9 Best Language 0[x]  1[]  2[]  3[]      10 Dysarthria 0[x]  1[]  2[]  UN[]      11 Extinct. and Inattention 0[x]  1[]  2[]       TOTAL: 0     ROS   Constitutional Denies weight loss, fever and chills.   HEENT Denies changes in vision and hearing.    Respiratory Denies SOB and cough.   CV Denies palpitations and CP   GI Denies abdominal pain, nausea, vomiting and diarrhea.   GU Denies dysuria and urinary frequency.   MSK Denies myalgia and joint pain.   Skin Denies rash and pruritus.   Neurological Denies headache and syncope.   Psychiatric Denies recent changes in mood. Denies anxiety and depression.    Past History   Past Medical History:  Diagnosis Date   Hypertension    History reviewed. No pertinent surgical history. No family history on file. Social History   Socioeconomic History   Marital status: Married    Spouse name: Not on file   Number of children: Not on file   Years of education: Not on file   Highest education level: Not on file  Occupational History   Not on file  Tobacco Use   Smoking status: Never   Smokeless tobacco: Never  Substance and Sexual Activity   Alcohol use: No  Drug use: No   Sexual activity: Never  Other Topics Concern   Not on file  Social History Narrative   Not on file   Social Determinants of Health   Financial Resource Strain: Not on file  Food Insecurity: Not on file  Transportation Needs: Not on file  Physical Activity: Not on file  Stress: Not on file  Social Connections: Not on file   No Known Allergies  Medications   Medications Prior to Admission  Medication Sig Dispense Refill Last Dose   albuterol (VENTOLIN HFA) 108 (90 Base) MCG/ACT inhaler Inhale 2 puffs into the lungs every 6 (six) hours as needed for wheezing or shortness of breath. 8 g 1 unknown   atenolol (TENORMIN) 50 MG tablet Take 1 tablet (50 mg total) by mouth daily. 90 tablet 3 10/08/2021 at 0800   lisinopril-hydrochlorothiazide (ZESTORETIC) 20-12.5 MG tablet Take 2 tablets by mouth daily. 180 tablet 0 Past Week   pseudoephedrine-guaifenesin (MUCINEX D) 60-600 MG 12 hr tablet Take 1 tablet by mouth daily as needed for congestion.   Past Week   azithromycin (ZITHROMAX) 250 MG  tablet As directed on pkg label (Patient not taking: Reported on 10/09/2021) 6 tablet 0 Not Taking   cyclobenzaprine (FLEXERIL) 10 MG tablet Take 0.5-1 tablets (5-10 mg total) by mouth 3 (three) times daily as needed for muscle spasms. (Patient not taking: Reported on 07/24/2019) 30 tablet 0 Not Taking   predniSONE (DELTASONE) 20 MG tablet 60mg  x 2 days, 40mg  x 4 days, 20mg  x 4 days (Patient not taking: Reported on 10/09/2021) 18 tablet 0 Not Taking   traMADol (ULTRAM) 50 MG tablet Take 1 tablet (50 mg total) by mouth every 8 (eight) hours as needed. (Patient not taking: Reported on 01/28/2020) 20 tablet 0 Not Taking     Vitals   Vitals:   10/10/21 0018 10/10/21 0058 10/10/21 0100 10/10/21 0257  BP: (!) 185/56 (!) 174/60 (!) 174/60 (!) 145/49  Pulse: (!) 54 (!) 53 (!) 52   Resp: 20 (!) 21 16 12   Temp: 98.7 F (37.1 C) 98.6 F (37 C)  98.6 F (37 C)  TempSrc: Oral Oral  Oral  SpO2: 97% 97% 98%      There is no height or weight on file to calculate BMI.  Physical Exam   General: Laying comfortably in bed; in no acute distress.  HENT: Normal oropharynx and mucosa. Normal external appearance of ears and nose.  Neck: Supple, no pain or tenderness  CV: No JVD. No peripheral edema.  Pulmonary: Symmetric Chest rise. Normal respiratory effort.  Abdomen: Soft to touch, non-tender.  Ext: No cyanosis, edema, or deformity  Skin: No rash. Normal palpation of skin.   Musculoskeletal: Normal digits and nails by inspection. No clubbing.   Neurologic Examination  Mental status/Cognition: Alert, oriented to self, place, month and year, good attention.  Speech/language: Fluent, comprehension intact, object naming intact, repetition intact.  Cranial nerves:   CN II Pupils equal and reactive to light, no VF deficits    CN III,IV,VI EOM intact, no gaze preference or deviation, no nystagmus    CN V normal sensation in V1, V2, and V3 segments bilaterally    CN VII no asymmetry, no nasolabial fold  flattening    CN VIII normal hearing to speech    CN IX & X normal palatal elevation, no uvular deviation    CN XI 5/5 head turn and 5/5 shoulder shrug bilaterally    CN XII midline tongue protrusion  Motor:  Muscle bulk: normal, tone normal, pronator drift none tremor none Mvmt Root Nerve  Muscle Right Left Comments  SA C5/6 Ax Deltoid 5 5   EF C5/6 Mc Biceps 5 5   EE C6/7/8 Rad Triceps 5 5   WF C6/7 Med FCR     WE C7/8 PIN ECU     F Ab C8/T1 U ADM/FDI 5 5   HF L1/2/3 Fem Illopsoas 5 5   KE L2/3/4 Fem Quad 5 5   DF L4/5 D Peron Tib Ant 5 5   PF S1/2 Tibial Grc/Sol 5 5    Reflexes:  Right Left Comments  Pectoralis      Biceps (C5/6) 2 2   Brachioradialis (C5/6) 1 1    Triceps (C6/7) 1 1    Patellar (L3/4) 1 1    Achilles (S1) 0 0    Hoffman      Plantar     Jaw jerk    Sensation:  Light touch Intact throughout   Pin prick    Temperature    Vibration   Proprioception    Coordination/Complex Motor:  - Finger to Nose intact BL - Heel to shin intact BL - Rapid alternating movement are normal - Gait: Deferred.  Labs   CBC:  Recent Labs  Lab 10/09/21 1503  WBC 6.8  HGB 12.1  HCT 38.3  MCV 96.5  PLT 103    Basic Metabolic Panel:  Lab Results  Component Value Date   NA 138 10/10/2021   K 3.2 (L) 10/10/2021   CO2 25 10/10/2021   GLUCOSE 95 10/10/2021   BUN 8 10/10/2021   CREATININE 1.20 (H) 10/10/2021   CALCIUM 9.3 10/10/2021   GFRNONAA 48 (L) 10/10/2021   GFRAA 74 09/05/2016   Lipid Panel:  Lab Results  Component Value Date   LDLCALC 155 (H) 10/09/2021   HgbA1c:  Lab Results  Component Value Date   HGBA1C 5.7 03/10/2017   Urine Drug Screen: No results found for: LABOPIA, COCAINSCRNUR, LABBENZ, AMPHETMU, THCU, LABBARB  Alcohol Level No results found for: [place]  CT Head without contrast(Personally reviewed): Notable for R hippocmapus hypodensity concerning for an acute stroke.  MR Angio head without contrast and Carotid Duplex  BL: pending  MRI Brain(Personally reviewed): 1. Evaluation is somewhat limited by motion artifact. Within this limitation, there is an acute infarct extending from the anterior to posterior right hippocampus. 2. Subcentimeter enhancing lesion along the right inferior frontal lobe, favored to be extradural, although evaluation is somewhat limited by motion artifact.   Impression   Darlene [name] is a 70 y.o. female with PMH significant for HTN who presents with 3 day history of confusion and found to have a R hippocampus stroke on MRI Brain. Her neurologic examination is notable for difficulty with memory but no other deficit.  Primary Diagnosis:  Cerebral infarction, unspecified.  Secondary Diagnosis: Hypertension Emergency (SBP > 180 or DBP > 120 & end organ damage)  Recommendations  Plan:  - Frequent Neuro checks per stroke unit protocol - Recommend Vascular imaging with MRA Angio Head without contrast and US Carotid doppler - Recommend obtaining TTE - Recommend obtaining Lipid panel with LDL - Please start statin if LDL > 70 - Recommend HbA1c - Antithrombotic - Aspirin 81mg  daily. - Recommend DVT ppx - SBP goal - permissive hypertension first 24 h < 220/110. Held home meds.  - Recommend Telemetry monitoring for arrythmia - Recommend bedside swallow screen prior to PO intake. - Stroke education  booklet - Recommend PT/OT/SLP consult   Plan discussed with Dr. Alcario Drought with the hospitalist team.  ______________________________________________________________________   Thank you for the opportunity to take part in the care of this patient. If you have any further questions, please contact the neurology consultation attending.  Signed,  [place] Pager Number [phone] _ _ _   _ __   _ __ _ _  __ __   _ __   __ _

## 2021-10-10 NOTE — Procedures (Signed)
Patient Name: Darlene [name]  MRN: [id number]  Epilepsy Attending: Lora Havens  Referring Physician/Provider: Albertine Patricia, MD Date: 10/10/2021 Duration: 46.51 mins  Patient history: 69yo F with ams. EEG to evaluate for seizure  Level of alertness: Awake, asleep  AEDs during EEG study: None  Technical aspects: This EEG study was done with scalp electrodes positioned according to the 10-20 International system of electrode placement. Electrical activity was acquired at a sampling rate of 500Hz  and reviewed with a high frequency filter of 70Hz  and a low frequency filter of 1Hz . EEG data were recorded continuously and digitally stored.   Description: The posterior dominant rhythm consists of 9 Hz activity of moderate voltage (25-35 uV) seen predominantly in posterior head regions, symmetric and reactive to eye opening and eye closing. Sleep was characterized by vertex waves, sleep spindles (12 to 14 Hz), maximal frontocentral region.   Hyperventilation and photic stimulation were not performed.     IMPRESSION: This study is within normal limits. No seizures or epileptiform discharges were seen throughout the recording.  Laritza Vokes Barbra Sarks

## 2021-10-10 NOTE — Evaluation (Signed)
Occupational Therapy Evaluation Patient Details Name: Darlene [name] MRN: [id number] DOB: 05-12-1951 Today's Date: 10/10/2021   History of Present Illness 70 y.o. female presents to [facility] hospital on 10/09/2021 with persistent confusion and tingling in finger tips. 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 PMH includes HTN.   Clinical Impression   PTA pt lives independently with her husband and son, drives and works as a Scientist, water quality at [employer]. Pt presents with significant cognitive impairment as noted below, however pt did have Ativan @ 3am and most likely did not sleep well. Daughter plans to take her Mom back to [place] while she recovers as she does not feel her Dad/brother can meet her needs at this time. Recommend pt follow up with OT at a neuro outpt clinic to maximize independence with ADL/IADL tasks. Will follow to facilitate safe DC home.       Recommendations for follow up therapy are one component of a multi-disciplinary discharge planning process, led by the attending physician.  Recommendations may be updated based on patient status, additional functional criteria and insurance authorization.   Follow Up Recommendations  Outpatient OT (neuro outpt)    Assistance Recommended at Discharge Frequent or constant Supervision/Assistance  Functional Status Assessment  Patient has had a recent decline in their functional status and demonstrates the ability to make significant improvements in function in a reasonable and predictable amount of time.  Equipment Recommendations  BSC/3in1 (to use as a shower chair)    Recommendations for Other Services Speech consult (cognitive)     Precautions / Restrictions Precautions Precautions: Fall      Mobility Bed Mobility Overal bed mobility: Modified Independent                  Transfers Overall transfer level: Needs assistance   Transfers: Sit  to/from Stand Sit to Stand: Min guard                  Balance Overall balance assessment: Mild deficits observed, not formally tested (per chart review fall in bathroom PTA; pt does not remember)                                         ADL either performed or assessed with clinical judgement   ADL Overall ADL's : Needs assistance/impaired     Grooming: Supervision/safety;Set up   Upper Body Bathing: Set up;Supervision/ safety   Lower Body Bathing: Min guard   Upper Body Dressing : Supervision/safety;Set up   Lower Body Dressing: Min guard;Sit to/from stand   Toilet Transfer: Minimal assistance   Toileting- Water quality scientist and Hygiene: Min guard       Functional mobility during ADLs: Minimal assistance General ADL Comments: Initially unsteady; improved with mobility Pt unable to complete 2 step trail making task during ADL; pt thought she had washed her face, however she did not complete the task; required tactile cues to figure out how to use the soap dispenser     Vision Baseline Vision/History: 1 Wears glasses Additional Comments: no glasses present; dtr states she wears them "as needed"     Perception Perception Comments: impaired placement of numbers and hands on clock draw   Praxis      Pertinent Vitals/Pain Pain Assessment: No/denies pain     Hand Dominance Right   Extremity/Trunk Assessment Upper Extremity Assessment  Upper Extremity Assessment: Overall WFL for tasks assessed   Lower Extremity Assessment Lower Extremity Assessment: Defer to PT evaluation   Cervical / Trunk Assessment Cervical / Trunk Assessment: Normal   Communication Communication Communication: No difficulties   Cognition Arousal/Alertness: Lethargic;Suspect due to medications Behavior During Therapy: Flat affect Overall Cognitive Status: Impaired/Different from baseline Area of Impairment: Orientation;Attention;Memory;Following  commands;Safety/judgement;Awareness;Problem solving                 Orientation Level: Disoriented to;Place;Time;Situation Current Attention Level: Sustained Memory: Decreased short-term memory Following Commands: Follows one step commands with increased time Safety/Judgement: Decreased awareness of safety;Decreased awareness of deficits Awareness: Intellectual Problem Solving: Slow processing General Comments: Scored a 0/5 on the Mini-cog indicating significnat cognitive impairment; however pt had Ativan @ 3am for MRI and per daughter had a restless night of sleep. Pt thought her daughter was her sister, even at end of session; dtr states her Mom was better yesterday than this am, however she was not back to  her baseline     General Comments       Exercises     Shoulder Instructions      Home Living Family/patient expects to be discharged to:: Private residence Living Arrangements: Spouse/significant other;Children Available Help at Discharge: Available 24 hours/day Type of Home: House Home Access: Stairs to enter [employer] of Steps: 2-3   Home Layout: One level     Bathroom Shower/Tub: Tub/shower unit;Walk-in shower   Bathroom Toilet: Standard Bathroom Accessibility: Yes How Accessible: Accessible via walker (sideways) Home Equipment: None          Prior Functioning/Environment Prior Level of Function : Independent/Modified Independent;Working/employed;Driving (worked at [employer] as a Scientist, water quality; 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)                        OT Problem List: Decreased activity tolerance;Impaired balance (sitting and/or standing);Impaired vision/perception;Decreased cognition;Decreased safety awareness;Decreased knowledge of use of DME or AE;Obesity      OT Treatment/Interventions: Self-care/ADL training;DME and/or AE instruction;Therapeutic activities;Cognitive remediation/compensation;Visual/perceptual  remediation/compensation;Patient/family education;Balance training    OT Goals(Current goals can be found in the care plan section) Acute Rehab OT Goals Patient Stated Goal: Per daughter for her Mom to get better OT Goal Formulation: With patient/family Time For Goal Achievement: 10/24/21 Potential to Achieve Goals: Good  OT Frequency: Min 2X/week   Barriers to D/C:            Co-evaluation              AM-PAC OT "6 Clicks" Daily Activity     Outcome Measure Help from another person eating meals?: None Help from another person taking care of personal grooming?: A Little Help from another person toileting, which includes using toliet, bedpan, or urinal?: A Little Help from another person bathing (including washing, rinsing, drying)?: A Little Help from another person to put on and taking off regular upper body clothing?: A Little Help from another person to put on and taking off regular lower body clothing?: A Little 6 Click Score: 19   End of Session Equipment Utilized During Treatment: Gait belt Nurse Communication: Mobility status;Other (comment) (DC needs)  Activity Tolerance: Patient limited by lethargy Patient left: in chair;with call bell/phone within reach;with family/visitor present  OT Visit Diagnosis: Unsteadiness on feet (R26.81);Other symptoms and signs involving cognitive function                Time: 7846-9629 OT Time Calculation (min): 29 min Charges:  OT General Charges $OT Visit: 1 Visit OT Evaluation $OT Eval Moderate Complexity: 1 Mod OT Treatments $Self Care/Home Management : 8-22 mins  Maurie Boettcher, OT/L   Acute OT Clinical Specialist Acute Rehabilitation Services Pager [phone] Office [phone]   [facility] 10/10/2021, 9:43 AM

## 2021-10-10 NOTE — Plan of Care (Signed)
Initiation of Care Plan   Problem: Education: Goal: Knowledge of General Education information will improve Description: Including pain rating scale, medication(s)/side effects and non-pharmacologic comfort measures Outcome: Progressing   Problem: Health Behavior/Discharge Planning: Goal: Ability to manage health-related needs will improve Outcome: Progressing   Problem: Clinical Measurements: Goal: Ability to maintain clinical measurements within normal limits will improve Outcome: Progressing Goal: Will remain free from infection Outcome: Progressing Goal: Diagnostic test results will improve Outcome: Progressing Goal: Respiratory complications will improve Outcome: Progressing Goal: Cardiovascular complication will be avoided Outcome: Progressing   Problem: Activity: Goal: Risk for activity intolerance will decrease Outcome: Progressing   Problem: Nutrition: Goal: Adequate nutrition will be maintained Outcome: Progressing   Problem: Coping: Goal: Level of anxiety will decrease Outcome: Progressing   Problem: Elimination: Goal: Will not experience complications related to bowel motility Outcome: Progressing Goal: Will not experience complications related to urinary retention Outcome: Progressing   Problem: Pain Managment: Goal: General experience of comfort will improve Outcome: Progressing   Problem: Safety: Goal: Ability to remain free from injury will improve Outcome: Progressing   Problem: Skin Integrity: Goal: Risk for impaired skin integrity will decrease Outcome: Progressing   Problem: Education: Goal: Knowledge of disease or condition will improve Outcome: Progressing Goal: Knowledge of secondary prevention will improve (SELECT ALL) Outcome: Progressing Goal: Knowledge of patient specific risk factors will improve (INDIVIDUALIZE FOR PATIENT) Outcome: Progressing Goal: Individualized Educational Video(s) Outcome: Progressing   Problem:  Coping: Goal: Will verbalize positive feelings about self Outcome: Progressing Goal: Will identify appropriate support needs Outcome: Progressing   Problem: Health Behavior/Discharge Planning: Goal: Ability to manage health-related needs will improve Outcome: Progressing   Problem: Self-Care: Goal: Ability to participate in self-care as condition permits will improve Outcome: Progressing Goal: Verbalization of feelings and concerns over difficulty with self-care will improve Outcome: Progressing Goal: Ability to communicate needs accurately will improve Outcome: Progressing   Problem: Nutrition: Goal: Risk of aspiration will decrease Outcome: Progressing Goal: Dietary intake will improve Outcome: Progressing   Problem: Intracerebral Hemorrhage Tissue Perfusion: Goal: Complications of Intracerebral Hemorrhage will be minimized Outcome: Progressing   Problem: Ischemic Stroke/TIA Tissue Perfusion: Goal: Complications of ischemic stroke/TIA will be minimized Outcome: Progressing   Problem: Spontaneous Subarachnoid Hemorrhage Tissue Perfusion: Goal: Complications of Spontaneous Subarachnoid Hemorrhage will be minimized Outcome: Progressing

## 2021-10-10 NOTE — Progress Notes (Signed)
PROGRESS NOTE    Darlene [name]  PPJ:[id number] DOB: [birth year] DOA: 10/09/2021 PCP: Horald Pollen, MD    Chief Complaint  Patient presents with   Altered Mental Status    Brief Narrative:   Darlene [name] is a 70 y.o. female with medical history significant of HTN, with brought in by family member for persistent confusion.  It was felt initially to be secondary to hypertensive emergency, but she remains confused despite correcting her blood pressure, MRI with evidence of acute CVA, neurology were consulted.  Assessment & Plan:   Principal Problem:   HTN (hypertension), malignant Active Problems:   Acute metabolic encephalopathy   Acute CVA -MRI brain significant for right hyper campus stroke. -GI input greatly appreciated, follow-up on TTE, carotid Dopplers, MRA angio head. -ET/OT input appreciated, SLP is pending -Continue monitoring on telemetry, so far no evidence of arrhythmias. -Not on any antithrombotic at home, started on aspirin by neurology -LDL is 155, will start on high intensity statin -A1c is acceptable at 5.7  Hyperlipidemia -LDL is 155, started on high intensity statin.  HTN emergency -For permissive hypertension, as needed hydralazine ordered for blood pressure> 180   Bradycardia -Hold atenolol, avoid further clonidine.   Acute metabolic encephalopathy -Due to acute CVA.     DVT prophylaxis: Lovenox Code Status: Full Family Communication: daughter at bedside Disposition:   Status is: Observation        Consultants:  neurology  Subjective:  Significant events overnight, she passed her bedside swallow evaluation this morning.  Objective: Vitals:   10/10/21 0300 10/10/21 0400 10/10/21 0500 10/10/21 0805  BP: (!) 158/60 (!) 174/54 (!) 182/156 (!) 154/53  Pulse:    (!) 50  Resp: (!) 24  (!) 25 20  Temp: 98.6 F (37 C) 98.6 F (37 C)  98.6 F (37 C)  TempSrc: Oral Oral  Oral  SpO2:    97%   No intake or output data in  the 24 hours ending 10/10/21 1016 There were no vitals filed for this visit.  Examination:  Patient is sleepy this morning, but she open her eyes, she able to withdraw all extremities without any focal deficits. Symmetrical Chest wall movement, Good air movement bilaterally, CTAB RRR,No Gallops,Rubs or new Murmurs, No Parasternal Heave +ve B.Sounds, Abd Soft, No tenderness, No rebound - guarding or rigidity. No Cyanosis, Clubbing or edema, No new Rash or bruise     Data Reviewed: I have personally reviewed following labs and imaging studies  CBC: Recent Labs  Lab 10/09/21 1503  WBC 6.8  HGB 12.1  HCT 38.3  MCV 96.5  PLT 580    Basic Metabolic Panel: Recent Labs  Lab 10/09/21 1503 10/10/21 0015  NA 139 138  K 3.6 3.2*  CL 106 105  CO2 28 25  GLUCOSE 108* 95  BUN 9 8  CREATININE 1.26* 1.20*  CALCIUM 9.3 9.3    GFR: CrCl cannot be calculated (Unknown ideal weight.).  Liver Function Tests: Recent Labs  Lab 10/09/21 1503  AST 17  ALT 20  ALKPHOS 69  BILITOT 1.1  PROT 7.3  ALBUMIN 3.6    CBG: No results for input(s): GLUCAP in the last 168 hours.   Recent Results (from the past 240 hour(s))  Resp Panel by RT-PCR (Flu A&B, Covid) Nasopharyngeal Swab     Status: None   Collection Time: 10/09/21  4:09 PM   Specimen: Nasopharyngeal Swab; Nasopharyngeal(NP) swabs in vial transport medium  Result Value Ref Range Status  SARS Coronavirus 2 by RT PCR NEGATIVE NEGATIVE Final    Comment: (NOTE) SARS-CoV-2 target nucleic acids are NOT DETECTED.  The SARS-CoV-2 RNA is generally detectable in upper respiratory specimens during the acute phase of infection. The lowest concentration of SARS-CoV-2 viral copies this assay can detect is 138 copies/mL. A negative result does not preclude SARS-Cov-2 infection and should not be used as the sole basis for treatment or other patient management decisions. A negative result may occur with  improper specimen  collection/handling, submission of specimen other than nasopharyngeal swab, presence of viral mutation(s) within the areas targeted by this assay, and inadequate number of viral copies(<138 copies/mL). A negative result must be combined with clinical observations, patient history, and epidemiological information. The expected result is Negative.  Fact Sheet for Patients:  EntrepreneurPulse.com.au  Fact Sheet for Healthcare Providers:  IncredibleEmployment.be  This test is no t yet approved or cleared by the [place] FDA and  has been authorized for detection and/or diagnosis of SARS-CoV-2 by FDA under an Emergency Use Authorization (EUA). This EUA will remain  in effect (meaning this test can be used) for the duration of the COVID-19 declaration under Section 564(b)(1) of the Act, 21 U.S.C.section 360bbb-3(b)(1), unless the authorization is terminated  or revoked sooner.       Influenza A by PCR NEGATIVE NEGATIVE Final   Influenza B by PCR NEGATIVE NEGATIVE Final    Comment: (NOTE) The Xpert Xpress SARS-CoV-2/FLU/RSV plus assay is intended as an aid in the diagnosis of influenza from Nasopharyngeal swab specimens and should not be used as a sole basis for treatment. Nasal washings and aspirates are unacceptable for Xpert Xpress SARS-CoV-2/FLU/RSV testing.  Fact Sheet for Patients: EntrepreneurPulse.com.au  Fact Sheet for Healthcare Providers: IncredibleEmployment.be  This test is not yet approved or cleared by the [place] FDA and has been authorized for detection and/or diagnosis of SARS-CoV-2 by FDA under an Emergency Use Authorization (EUA). This EUA will remain in effect (meaning this test can be used) for the duration of the COVID-19 declaration under Section 564(b)(1) of the Act, 21 U.S.C. section 360bbb-3(b)(1), unless the authorization is terminated or revoked.  Performed at [facility], [place] [address], [place], [place] [address]   MRSA Next Gen by PCR, Nasal     Status: None   Collection Time: 10/09/21  9:09 PM   Specimen: Nasal Mucosa; Nasal Swab  Result Value Ref Range Status   MRSA by PCR Next Gen NOT DETECTED NOT DETECTED Final    Comment: (NOTE) The GeneXpert MRSA Assay (FDA approved for NASAL specimens only), is one component of a comprehensive MRSA colonization surveillance program. It is not intended to diagnose MRSA infection nor to guide or monitor treatment for MRSA infections. Test performance is not FDA approved in patients less than 59 years old. Performed at [facility], [place] [address] [place], [place] [address]          Radiology Studies: CT HEAD WO CONTRAST (5MM)  Result Date: 10/09/2021 CLINICAL DATA:  Confusion. Head trauma. Elevated blood pressure. See this is wider than EXAM: CT HEAD WITHOUT CONTRAST TECHNIQUE: Contiguous axial images were obtained from the base of the skull through the vertex without intravenous contrast. COMPARISON:  None. FINDINGS: Brain: No evidence of acute infarction, hemorrhage, hydrocephalus, extra-axial collection or mass lesion/mass effect. Asymmetric right hippocampal atrophy with associated mild dilatation of the temporal horn of the right lateral ventricle. Vascular: No hyperdense vessel or unexpected calcification. Skull: Negative for  fracture.  Hyperostosis frontalis interna. Sinuses/Orbits: No acute finding. Other: None. IMPRESSION: No acute intracranial abnormality. Electronically Signed   By: Ileana Roup M.D.   On: 10/09/2021 16:50   MR BRAIN W WO CONTRAST  Result Date: 10/10/2021 CLINICAL DATA:  Mental status change, unknown cause EXAM: MRI HEAD WITHOUT AND WITH CONTRAST TECHNIQUE: Multiplanar, multiecho pulse sequences of the brain and surrounding structures were obtained without and with intravenous contrast. CONTRAST:  8.33mL GADAVIST GADOBUTROL 1 MMOL/ML IV SOLN COMPARISON:  No prior  MRI, correlation is made with CT 10/09/2021. FINDINGS: Evaluation is somewhat limited by motion artifact. Brain: Restricted diffusion with ADC correlate in the right hippocampus, extending from anterior to posterior (series 5, images 65-73). This area is associated with mildly increased T2 signal, likely cytotoxic edema, but no significant contrast enhancement. Along the anterior right temporal lobe/inferior right frontal lobe, there is a 0.8 x 0.9 x 0.7 cm enhancing lesion (AP x TR x CC) (series 21, image 26 and series 23, image 7). This is somewhat poorly evaluated given motion artifact affecting several sequences, but is favored to be extra-axial, as a CSF cleft is suggested on coronal T2 sequence (series 20, image 19). No acute hemorrhage, mass effect, or midline shift. No hydrocephalus or extra-axial collection. Scattered T2 hyperintense signal in the periventricular white matter, likely the sequela of mild chronic small vessel ischemic disease. Vascular: Normal flow voids. Skull and upper cervical spine: Normal marrow signal. Sinuses/Orbits: Mucosal thickening in the anterior ethmoid air cells. The orbits are unremarkable. Other: Trace fluid in the mastoid air cells. IMPRESSION: 1. Evaluation is somewhat limited by motion artifact. Within this limitation, there is an acute infarct extending from the anterior to posterior right hippocampus. 2. Subcentimeter enhancing lesion along the right inferior frontal lobe, favored to be extradural, although evaluation is somewhat limited by motion artifact. These results were called by telephone at the time of interpretation on 10/10/2021 at 3:08 am to provider Dr. Fabio Neighbors , who verbally acknowledged these results. Electronically Signed   By: Merilyn Baba M.D.   On: 10/10/2021 03:09   DG Chest Portable 1 View  Result Date: 10/09/2021 CLINICAL DATA:  Confusion.  Recent fall. EXAM: PORTABLE CHEST 1 VIEW COMPARISON:  None. FINDINGS: Of note, the patient is mildly  rotated on this portable examination. Borderline enlarged cardiac silhouette. Both lungs are clear. Multilevel degenerative changes in the mid and distal thoracic spine. No acute osseous abnormality. IMPRESSION: No active disease. Electronically Signed   By: Ileana Roup M.D.   On: 10/09/2021 16:27        Scheduled Meds:   stroke: mapping our early stages of recovery book   Does not apply Once   aspirin EC  81 mg Oral Daily   atorvastatin  80 mg Oral Daily   enoxaparin (LOVENOX) injection  40 mg Subcutaneous Q24H   potassium chloride  40 mEq Oral Once   Continuous Infusions:   LOS: 0 days      Phillips Climes, MD [facility]   To contact the attending provider between 7A-7P or the covering provider during after hours 7P-7A, please log into the web site [email or web address] and access using universal [place] password for that web site. If you do not have the password, please call the hospital operator.  10/10/2021, 10:16 AM

## 2021-10-10 NOTE — Evaluation (Signed)
Speech Language Pathology Evaluation Patient Details Name: Darlene [name] MRN: [id number] DOB: 10/01/1951 Today's Date: 10/10/2021 Time: 2025-4270 SLP Time Calculation (min) (ACUTE ONLY): 17 min  Problem List:  Patient Active Problem List   Diagnosis Date Noted   Cerebral thrombosis with cerebral infarction 62/37/6283   Acute metabolic encephalopathy 15/17/6160   HTN (hypertension), malignant 10/09/2021   Essential hypertension 08/17/2018   Past Medical History:  Past Medical History:  Diagnosis Date   Hypertension    Past Surgical History: History reviewed. No pertinent surgical history. HPI:  Pt is a 70 y.o. female who presented with 3 day history of confusion. 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 Subcentimeter enhancing lesion along the right inferior frontal lobe, favored to be extradural. PMH: HTN   Assessment / Plan / Recommendation Clinical Impression  Pt participated in speech/language/cognition evaluation with her husband and daughter present. Pt reported that she was independent at baseline and has a high-school education. She stated that, at baseline, she works at [employer] as a Scientist, water quality, and completes financial and medication Mudlogger. Pt denied any acute changes in speech, language, or cognition. Pt's family agreed that the pt's speech and language are at baseline, but stated the pt is demonstrating acute changes in memory, and orientation with frequent repetition. The [facility] Mental Status Examination was completed to evaluate the pt's cognitive-linguistic skills. She achieved a score of 12/30 which is below the normal limits of 27 or more out of 30. She exhibited difficulty in the areas of temporal orientation, awareness,  memory, and executive function, but she benefited from repetition and additional processing time. No motor speech deficits were noted. Skilled SLP services are clinically indicated at this  time to improve cognitive-linguistic function.    SLP Assessment  SLP Recommendation/Assessment: Patient needs continued Speech [facility] SLP Visit Diagnosis: Cognitive communication deficit (R41.841)    Recommendations for follow up therapy are one component of a multi-disciplinary discharge planning process, led by the attending physician.  Recommendations may be updated based on patient status, additional functional criteria and insurance authorization.    Follow Up Recommendations  Outpatient SLP    Assistance Recommended at Discharge  None  Functional Status Assessment Patient has had a recent decline in their functional status and demonstrates the ability to make significant improvements in function in a reasonable and predictable amount of time.  Frequency and Duration min 2x/week  2 weeks      SLP Evaluation Cognition  Overall Cognitive Status: Impaired/Different from baseline Arousal/Alertness: Awake/alert Orientation Level: Oriented X4 Year: 2022 Day of Week: Incorrect Attention: Focused;Sustained Focused Attention: Appears intact Memory: Impaired Memory Impairment: Retrieval deficit;Decreased recall of new information;Decreased short term memory (Immediate: 5/5 with repetition; delayed: 0/5; with cues: 3/5) Awareness: Impaired Awareness Impairment: Emergent impairment Executive Function: Sequencing;Organizing Sequencing: Impaired Sequencing Impairment: Verbal complex (clock drawing: 0/4) Organizing: Impaired Organizing Impairment: Verbal complex (backward digit span: 0/2)       Comprehension  Auditory Comprehension Overall Auditory Comprehension: Appears within functional limits for tasks assessed Yes/No Questions: Within Functional Limits Commands: Within Functional Limits Conversation: Complex    Expression Expression Primary Mode of Expression: Verbal Verbal Expression Initiation: No impairment Repetition: No impairment Naming: No  impairment Pragmatics: Impairment Impairments: Eye contact Written Expression Dominant Hand: Right   Oral / Motor  Oral Motor/Sensory Function Overall Oral Motor/Sensory Function: Within functional limits Motor Speech Overall Motor Speech: Appears within functional limits for tasks assessed Respiration: Within functional limits Phonation:  Normal Resonance: Within functional limits Articulation: Within functional limitis Intelligibility: Intelligible Motor Planning: Witnin functional limits Motor Speech Errors: Not applicable           Duwayne Mat[employer] I. Hardin Negus, [place], [place] Office number [phone] Pager [phone]  Horton Marshall 10/10/2021, 11:45 AM

## 2021-10-11 ENCOUNTER — Other Ambulatory Visit: Payer: Self-pay | Admitting: Physician Assistant

## 2021-10-11 DIAGNOSIS — I10 Essential (primary) hypertension: Secondary | ICD-10-CM

## 2021-10-11 DIAGNOSIS — I4891 Unspecified atrial fibrillation: Secondary | ICD-10-CM

## 2021-10-11 LAB — CBC
HCT: 38.9 % (ref 36.0–46.0)
Hemoglobin: 12.5 g/dL (ref 12.0–15.0)
MCH: 30.6 pg (ref 26.0–34.0)
MCHC: 32.1 g/dL (ref 30.0–36.0)
MCV: 95.3 fL (ref 80.0–100.0)
Platelets: 232 10*3/uL (ref 150–400)
RBC: 4.08 MIL/uL (ref 3.87–5.11)
RDW: 13.6 % (ref 11.5–15.5)
WBC: 6.9 10*3/uL (ref 4.0–10.5)
nRBC: 0 % (ref 0.0–0.2)

## 2021-10-11 LAB — BASIC METABOLIC PANEL
Anion gap: 5 (ref 5–15)
BUN: 19 mg/dL (ref 8–23)
CO2: 24 mmol/L (ref 22–32)
Calcium: 9.3 mg/dL (ref 8.9–10.3)
Chloride: 109 mmol/L (ref 98–111)
Creatinine, Ser: 1.41 mg/dL — ABNORMAL HIGH (ref 0.44–1.00)
GFR, Estimated: 40 mL/min — ABNORMAL LOW (ref >60)
Glucose, Bld: 101 mg/dL — ABNORMAL HIGH (ref 70–99)
Potassium: 4.1 mmol/L (ref 3.5–5.1)
Sodium: 138 mmol/L (ref 135–145)

## 2021-10-11 MED ORDER — ATORVASTATIN CALCIUM 80 MG PO TABS: 80.0000 mg | ORAL_TABLET | Freq: Every day | ORAL | 0 refills | Status: DC

## 2021-10-11 MED ORDER — LISINOPRIL-HYDROCHLOROTHIAZIDE 20-12.5 MG PO TABS: 2.0000 | ORAL_TABLET | Freq: Every day | ORAL | 0 refills | Status: DC

## 2021-10-11 MED ORDER — CLOPIDOGREL BISULFATE 75 MG PO TABS: 75.0000 mg | ORAL_TABLET | Freq: Every day | ORAL | 0 refills | Status: DC

## 2021-10-11 MED ORDER — ASPIRIN 81 MG PO TBEC: 81.0000 mg | DELAYED_RELEASE_TABLET | Freq: Every day | ORAL | 0 refills | Status: DC

## 2021-10-11 NOTE — Consult Note (Addendum)
ELECTROPHYSIOLOGY CONSULT NOTE  Patient ID: Darlene [name] MRN: [id number], DOB/AGE: [birth year]   Admit date: 10/09/2021 Date of Consult: 10/11/2021  Primary Physician: Horald Pollen, MD Primary Cardiologist: none Reason for Consultation: Cryptogenic stroke ; recommendations regarding Implantable Loop Recorder, requested by Dr. Leonie Man  History of Present Illness Darlene [name] was admitted on 10/09/2021 with AMS found with stroke.    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  she has undergone workup for stroke including echocardiogram and carotid dopplers.  The patient has been monitored on telemetry which has demonstrated sinus rhythm with no arrhythmias.    Neurology has deferred TEE.   Echocardiogram this admission demonstrated    1. Left ventricular ejection fraction, by estimation, is 65 to 70%. Left  ventricular ejection fraction by 3D volume is 65 %. The left ventricle has  normal function. The left ventricle has no regional wall motion  abnormalities. There is severe  hypertrophy of the septum. The rest of the LV segments demonstrate  moderate left ventricular hypertrophy. Left ventricular diastolic  parameters are consistent with Grade I diastolic dysfunction (impaired  relaxation).   2. Right ventricular systolic function is normal. The right ventricular  size is normal. There is mildly elevated pulmonary artery systolic  pressure. The estimated right ventricular systolic pressure is 51.7 mmHg.   3. Left atrial size was severely dilated.   4. The mitral valve is normal in structure. Mild mitral valve  regurgitation.   5. The aortic valve is tricuspid. There is mild calcification of the  aortic valve. There is mild thickening of the aortic valve. Aortic valve  regurgitation is not visualized. Aortic valve sclerosis is present, with  no evidence of aortic valve stenosis.   6. The  inferior vena cava is normal in size with <50% respiratory  variability, suggesting right atrial pressure of 8 mmHg.    Lab work is reviewed.  Prior to admission, the patient denies chest pain, shortness of breath, dizziness, palpitations, or syncope.  She is recovering from their stroke with plans to Home at discharge.   Past Medical History:  Diagnosis Date   Hypertension      Surgical History: History reviewed. No pertinent surgical history.   Medications Prior to Admission  Medication Sig Dispense Refill Last Dose   albuterol (VENTOLIN HFA) 108 (90 Base) MCG/ACT inhaler Inhale 2 puffs into the lungs every 6 (six) hours as needed for wheezing or shortness of breath. 8 g 1 unknown   atenolol (TENORMIN) 50 MG tablet Take 1 tablet (50 mg total) by mouth daily. 90 tablet 3 10/08/2021 at 0800   lisinopril-hydrochlorothiazide (ZESTORETIC) 20-12.5 MG tablet Take 2 tablets by mouth daily. 180 tablet 0 Past Week   pseudoephedrine-guaifenesin (MUCINEX D) 60-600 MG 12 hr tablet Take 1 tablet by mouth daily as needed for congestion.   Past Week   azithromycin (ZITHROMAX) 250 MG tablet As directed on pkg label (Patient not taking: Reported on 10/09/2021) 6 tablet 0 Not Taking   cyclobenzaprine (FLEXERIL) 10 MG tablet Take 0.5-1 tablets (5-10 mg total) by mouth 3 (three) times daily as needed for muscle spasms. (Patient not taking: Reported on 07/24/2019) 30 tablet 0 Not Taking   predniSONE (DELTASONE) 20 MG tablet 60mg  x 2 days, 40mg  x 4 days, 20mg  x 4 days (Patient not taking: Reported on 10/09/2021) 18 tablet 0 Not Taking   traMADol (ULTRAM) 50 MG tablet Take 1 tablet (50 mg  total) by mouth every 8 (eight) hours as needed. (Patient not taking: Reported on 01/28/2020) 20 tablet 0 Not Taking    Inpatient Medications:   aspirin EC  81 mg Oral Daily   atorvastatin  80 mg Oral Daily   clopidogrel  75 mg Oral Daily   enoxaparin (LOVENOX) injection  40 mg Subcutaneous Q24H    Allergies: No Known  Allergies  Social History   Socioeconomic History   Marital status: Married    Spouse name: Not on file   Number of children: Not on file   Years of education: Not on file   Highest education level: Not on file  Occupational History   Not on file  Tobacco Use   Smoking status: Never   Smokeless tobacco: Never  Substance and Sexual Activity   Alcohol use: No   Drug use: No   Sexual activity: Never  Other Topics Concern   Not on file  Social History Narrative   Not on file   Social Determinants of Health   Financial Resource Strain: Not on file  Food Insecurity: Not on file  Transportation Needs: Not on file  Physical Activity: Not on file  Stress: Not on file  Social Connections: Not on file  Intimate Partner Violence: Not on file     Family Hx: Father had HTN and a stroke    Review of Systems: All other systems reviewed and are otherwise negative except as noted above.  Physical Exam: Vitals:   10/10/21 2100 10/10/21 2328 10/11/21 0506 10/11/21 0753  BP:  (!) 146/69 (!) 154/87 (!) 153/50  Pulse:  63 (!) 57   Resp:  20 20 (!) 29  Temp:  98.9 F (37.2 C) 98.4 F (36.9 C) 98.6 F (37 C)  TempSrc:  Oral Oral   SpO2:  95% 97%   Weight: 88.9 kg     Height: 5\' 5"  (1.651 m)       GEN- The patient is well appearing, alert and oriented x 3 today.   Head- normocephalic, atraumatic Eyes-  Sclera clear, conjunctiva pink Ears- hearing intact Oropharynx- clear Neck- supple Lungs- CTA b/l, normal work of breathing Heart- RRR, no murmurs, rubs or gallops  GI- soft, NT, ND Extremities- no clubbing, cyanosis, or edema MS- no significant deformity or atrophy Skin- no rash or lesion Psych- euthymic mood, full affect   Labs:   Lab Results  Component Value Date   WBC 6.9 10/11/2021   HGB 12.5 10/11/2021   HCT 38.9 10/11/2021   MCV 95.3 10/11/2021   PLT 232 10/11/2021    Recent Labs  Lab 10/09/21 1503 10/10/21 0015 10/11/21 0106  NA 139   < > 138  K 3.6    < > 4.1  CL 106   < > 109  CO2 28   < > 24  BUN 9   < > 19  CREATININE 1.26*   < > 1.41*  CALCIUM 9.3   < > 9.3  PROT 7.3  --   --   BILITOT 1.1  --   --   ALKPHOS 69  --   --   ALT 20  --   --   AST 17  --   --   GLUCOSE 108*   < > 101*   < > = values in this interval not displayed.   No results found for: CKTOTAL, CKMB, CKMBINDEX, TROPONINI Lab Results  Component Value Date   CHOL 208 (H) 10/09/2021   CHOL  200 (H) 09/05/2016   Lab Results  Component Value Date   HDL 41 10/09/2021   HDL 51 09/05/2016   Lab Results  Component Value Date   LDLCALC 155 (H) 10/09/2021   LDLCALC 136 (H) 09/05/2016   Lab Results  Component Value Date   TRIG 59 10/09/2021   TRIG 63 09/05/2016   Lab Results  Component Value Date   CHOLHDL 5.1 10/09/2021   CHOLHDL 3.9 09/05/2016   No results found for: LDLDIRECT  No results found for: DDIMER   Radiology/Studies:  CT HEAD WO CONTRAST (5MM) Result Date: 10/09/2021 CLINICAL DATA:  Confusion. Head trauma. Elevated blood pressure. See this is wider than EXAM: CT HEAD WITHOUT CONTRAST TECHNIQUE: Contiguous axial images were obtained from the base of the skull through the vertex without intravenous contrast. COMPARISON:  None. FINDINGS: Brain: No evidence of acute infarction, hemorrhage, hydrocephalus, extra-axial collection or mass lesion/mass effect. Asymmetric right hippocampal atrophy with associated mild dilatation of the temporal horn of the right lateral ventricle. Vascular: No hyperdense vessel or unexpected calcification. Skull: Negative for fracture.  Hyperostosis frontalis interna. Sinuses/Orbits: No acute finding. Other: None. IMPRESSION: No acute intracranial abnormality. Electronically Signed   By: Ileana Roup M.D.   On: 10/09/2021 16:50   MR ANGIO HEAD WO CONTRAST Result Date: 10/10/2021 CLINICAL DATA:  Infarcts seen on prior MRI EXAM: MRA HEAD WITHOUT CONTRAST TECHNIQUE: Angiographic images of the Circle of Willis were acquired  using MRA technique without intravenous contrast. COMPARISON:  Brain MRI obtained earlier the same day, CT head obtained 1 day prior FINDINGS: Anterior circulation: The intracranial ICAs are patent. The bilateral MCAs are patent. The right A1 segment is hypoplastic or absent, a normal variant. The left A1 segment is patent. The bilateral ACAs are otherwise patent. The anterior communicating artery is patent. There is no aneurysm. Posterior circulation: The bilateral V4 segments are patent. The basilar artery is patent. There is multifocal irregularity of the right P1 and P2 segments. There is focal severe stenosis of the proximal right P2 segment (2-100) and focal severe stenosis more distally in the right ambient cistern (2-121). The distal right PCA is slightly diminished in caliber compared to the left. The left PCA is patent. A small left posterior communicating artery is identified. The right posterior communicating artery is not definitely seen There is no aneurysm Anatomic variants: As above. Other: None. IMPRESSION: 1. 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 2. Otherwise, patent intracranial vasculature. Electronically Signed   By: Valetta Mole M.D.   On: 10/10/2021 15:38   MR BRAIN W WO CONTRAST Result Date: 10/10/2021 CLINICAL DATA:  Mental status change, unknown cause EXAM: MRI HEAD WITHOUT AND WITH CONTRAST TECHNIQUE: Multiplanar, multiecho pulse sequences of the brain and surrounding structures were obtained without and with intravenous contrast. CONTRAST:  8.59mL GADAVIST GADOBUTROL 1 MMOL/ML IV SOLN COMPARISON:  No prior MRI, correlation is made with CT 10/09/2021. FINDINGS: Evaluation is somewhat limited by motion artifact. Brain: Restricted diffusion with ADC correlate in the right hippocampus, extending from anterior to posterior (series 5, images 65-73). This area is associated with mildly  increased T2 signal, likely cytotoxic edema, but no significant contrast enhancement. Along the anterior right temporal lobe/inferior right frontal lobe, there is a 0.8 x 0.9 x 0.7 cm enhancing lesion (AP x TR x CC) (series 21, image 26 and series 23, image 7). This is somewhat poorly evaluated given  motion artifact affecting several sequences, but is favored to be extra-axial, as a CSF cleft is suggested on coronal T2 sequence (series 20, image 19). No acute hemorrhage, mass effect, or midline shift. No hydrocephalus or extra-axial collection. Scattered T2 hyperintense signal in the periventricular white matter, likely the sequela of mild chronic small vessel ischemic disease. Vascular: Normal flow voids. Skull and upper cervical spine: Normal marrow signal. Sinuses/Orbits: Mucosal thickening in the anterior ethmoid air cells. The orbits are unremarkable. Other: Trace fluid in the mastoid air cells. IMPRESSION: 1. Evaluation is somewhat limited by motion artifact. Within this limitation, there is an acute infarct extending from the anterior to posterior right hippocampus. 2. Subcentimeter enhancing lesion along the right inferior frontal lobe, favored to be extradural, although evaluation is somewhat limited by motion artifact. These results were called by telephone at the time of interpretation on 10/10/2021 at 3:08 am to provider Dr. Fabio Neighbors , who verbally acknowledged these results. Electronically Signed   By: Merilyn Baba M.D.   On: 10/10/2021 03:09   DG Chest Portable 1 View Result Date: 10/09/2021 CLINICAL DATA:  Confusion.  Recent fall. EXAM: PORTABLE CHEST 1 VIEW COMPARISON:  None. FINDINGS: Of note, the patient is mildly rotated on this portable examination. Borderline enlarged cardiac silhouette. Both lungs are clear. Multilevel degenerative changes in the mid and distal thoracic spine. No acute osseous abnormality. IMPRESSION: No active disease. Electronically Signed   By: Ileana Roup M.D.   On:  10/09/2021 16:27   EEG adult Result Date: 10/10/2021 Lora Havens, MD     10/10/2021  5:49 PM Patient Name: Shelsie Tijerino MRN: [id number] Epilepsy Attending: Lora Havens Referring Physician/Provider: Albertine Patricia, MD Date: 10/10/2021 Duration: 46.51 mins Patient history: 70yo F with ams. EEG to evaluate for seizure Level of alertness: Awake, asleep AEDs during EEG study: None Technical aspects: This EEG study was done with scalp electrodes positioned according to the 10-20 International system of electrode placement. Electrical activity was acquired at a sampling rate of 500Hz  and reviewed with a high frequency filter of 70Hz  and a low frequency filter of 1Hz . EEG data were recorded continuously and digitally stored. Description: The posterior dominant rhythm consists of 9 Hz activity of moderate voltage (25-35 uV) seen predominantly in posterior head regions, symmetric and reactive to eye opening and eye closing. Sleep was characterized by vertex waves, sleep spindles (12 to 14 Hz), maximal frontocentral region.  Hyperventilation and photic stimulation were not performed.   IMPRESSION: This study is within normal limits. No seizures or epileptiform discharges were seen throughout the recording. Priyanka O Yadav   VAS US CAROTID Result Date: 10/10/2021 Carotid Arterial Duplex Study Patient Name:  ISABELE LOLLAR  Date of Exam:   10/10/2021 Medical Rec #: [id number]       Accession #:    [id number] Date of Birth: [birth year]       Patient Gender: F Patient Age:   29 years Exam Location:  [facility] Procedure:      VAS US CAROTID Referring Phys: Alferd Patee [facility] --------------------------------------------------------------------------------  Indications:       CVA and Altered mental status. Risk Factors:      Hypertension. Comparison Study:  No prior study on file Performing Technologist: Sharion Dove RVS  Examination Guidelines: A complete evaluation includes B-mode imaging, spectral  Doppler, color Doppler, and power Doppler as needed of all accessible portions of each vessel. Bilateral testing is considered an integral part of a complete examination. Limited examinations for reoccurring  indications may be performed as noted.  Right Carotid Findings:  Summary: Right Carotid: The extracranial vessels were near-normal with only minimal wall                thickening or plaque. Left Carotid: The extracranial vessels were near-normal with only minimal wall               thickening or plaque. Vertebrals:  Bilateral vertebral arteries demonstrate antegrade flow. Subclavians: Normal flow hemodynamics were seen in bilateral subclavian              arteries. *See table(s) above for measurements and observations.     Preliminary     12-lead ECG SR/SB All prior EKG's in EPIC reviewed with no documented atrial fibrillation  Telemetry SR, she had 5-6 seconds of an irregular rhythm, suspicious though not clearly AFib  Assessment and Plan:  1. Cryptogenic stroke The patient presents with cryptogenic stroke.  I spoke at length with the patient and her husband at bedside about monitoring for afib with either a 30 day event monitor or an implantable loop recorder.  Risks, benefits, and alteratives to implantable loop recorder were discussed with the patient today.   At this time, the patient is very clear, she (they) would like to proceed with 30 day monitoring 1st.  Dr. Curt Bears has seen and examined her, discussed the same with the patient/husband, and daughter  I have ordered her 30 day monitor and EP follow up Is in place   Baldwin Jamaica, PA-C 10/11/2021  I have seen and examined this patient with Tommye Standard.  Agree with above, note added to reflect my findings.  Patient presented to the hospital with cryptogenic stroke.  No cause has thus far been found.  She has fortunately been improving.  GEN: Well nourished, well developed, in no acute distress  HEENT: normal  Neck: no JVD,  carotid bruits, or masses Cardiac: RRR; no murmurs, rubs, or gallops,no edema  Respiratory:  clear to auscultation bilaterally, normal work of breathing GI: soft, nontender, nondistended, + BS MS: no deformity or atrophy  Skin: warm and dry Neuro:  Strength and sensation are intact Psych: euthymic mood, full affect   Cryptogenic stroke: Thus far no cause has been determined.  Echo with a normal ejection fraction.  The patient has been offered Linq monitor implant, though they would like to hold off for now.  We Solene Hereford plan for 30-day monitor and if unrevealing, Linq monitor implant would be indicated.  Reinhart Saulters M. Milli Woolridge MD 10/11/2021 2:20 PM

## 2021-10-11 NOTE — Discharge Instructions (Signed)
Follow with Primary MD Horald Pollen, MD in 10  days   Get CBC, CMP,  checked  by Primary MD next visit.    Activity: As tolerated with Full fall precautions use walker/cane & assistance as needed   Disposition Home    Diet: Heart Healthy , LOW SALT   On your next visit with your primary care physician please Get Medicines reviewed and adjusted.   Please request your Prim.MD to go over all Hospital Tests and Procedure/Radiological results at the follow up, please get all Hospital records sent to your Prim MD by signing hospital release before you go home.   If you experience worsening of your admission symptoms, develop shortness of breath, life threatening emergency, suicidal or homicidal thoughts you must seek medical attention immediately by calling 911 or calling your MD immediately  if symptoms less severe.  You Must read complete instructions/literature along with all the possible adverse reactions/side effects for all the Medicines you take and that have been prescribed to you. Take any new Medicines after you have completely understood and accpet all the possible adverse reactions/side effects.   Do not drive, operating heavy machinery, perform activities at heights, swimming or participation in water activities or provide baby sitting services if your were admitted for syncope or siezures until you have seen by Primary MD or a Neurologist and advised to do so again.  Do not drive when taking Pain medications.    Do not take more than prescribed Pain, Sleep and Anxiety Medications  Special Instructions: If you have smoked or chewed Tobacco  in the last 2 yrs please stop smoking, stop any regular Alcohol  and or any Recreational drug use.  Wear Seat belts while driving.   Please note  You were cared for by a hospitalist during your hospital stay. If you have any questions about your discharge medications or the care you received while you were in the hospital  after you are discharged, you can call the unit and asked to speak with the hospitalist on call if the hospitalist that took care of you is not available. Once you are discharged, your primary care physician will handle any further medical issues. Please note that NO REFILLS for any discharge medications will be authorized once you are discharged, as it is imperative that you return to your primary care physician (or establish a relationship with a primary care physician if you do not have one) for your aftercare needs so that they can reassess your need for medications and monitor your lab values.

## 2021-10-11 NOTE — Progress Notes (Signed)
Mobility Specialist Progress Note    10/11/21 1311  Mobility  Activity Ambulated in hall  Level of Assistance Independent  Assistive Device None  Distance Ambulated (ft) 400 ft  Mobility Ambulated independently in hallway  Mobility Response Tolerated well  Mobility performed by Mobility specialist  $Mobility charge 1 Mobility   Pre-Mobility: 58 HR, 183/58 BP Post-Mobility: 63 HR, 157/56 BP  Pt received in bed and agreeable. No complaints on walk. Returned to bed with call bell in reach and family present.   [facility] Mobility Specialist  M.S. Primary Phone: [phone] M.S. Secondary Phone: [phone]

## 2021-10-11 NOTE — Discharge Summary (Signed)
Physician Discharge Summary  Darlene [name] OFB:[id number] DOB: 03-15-1951 DOA: 10/09/2021  PCP: Horald Pollen, MD  Admit date: 10/09/2021 Discharge date: 10/11/2021  Admitted From: Home Disposition:  Home  Recommendations for Outpatient Follow-up:  Follow up with PCP in 2 weeks Please obtain CMP/CBC in 2 weeks No has been discontinued due to bradycardia, she was instructed to resume lisinopril/hydrochlorothiazide in couple days as allowing for permissive hypertension Patient to follow with EP cardiology, she will have 30 days monitor delivered to her house, she declined loop recorder during hospital stay To follow with neurology Dr. Leonie Man as an outpatient in 8 weeks, she will need MRI brain with IV contrast to evaluate for meningioma as well.  Home Health: Outpatient referral to cognitive OT   Discharge Condition:Stable CODE STATUS:FULL Diet recommendation: Heart Healthy    Brief/Interim Summary:  [name] is a 70 y.o. female with history of hypertension presenting with 3 days of confusion, but no difficulty speaking.  Her daughter reports that she was on the phone with the patient about 3 days ago noted that she was not making sense.  She was talking about calling her boss at [facility] to let them know she would not be able to come to work, however she has not worked there for years.  Her daughter flew down from [place] to check on her and bring her to the ED.  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  She was admitted for further work-up  Acute CVA -MRI brain significant for right hippocampus stroke.  Carotid Dopplers and 2D echo were unremarkable, neurology input greatly appreciated, recommendation for loop recorder to monitor for A. fib, she was monitored on telemetry with A. fib during hospital stay, EP cardiology were consulted, patient declined loop recorder insertion during hospital stay, so she will have 30 days cardiac monitor  to be arranged by EP, and where they will follow her as an outpatient as well. -She was to continue aspirin and Plavix for stroke prevention for 3 weeks and then aspirin alone -She is to follow with neurology as an outpatient, she will need outpatient repeat MRI with and without contrast to evaluate for possible right frontal meningioma which was incompletely visualized during MRI without contrast during hospital stay, neurology will arrange for that.  Hyperlipidemia -LDL is 155, started on high intensity statin.  Lipitor 80 mg daily   HTN emergency -Blood pressure medications has been held during hospital stay, she was instructed to discontinue lactulose during hospital stay given bradycardia, she is to resume her lisinopril/hydrochlorothiazide and 8 hours as an outpatient to allow for permissive hypertension.   Bradycardia -Hold atenolol, avoid further clonidine.   Acute metabolic encephalopathy -Due to acute CVA.  And Ativan she received for her MRI, mentation back to baseline, but she remains with short-term/long-term memory due to her acute CVA.   Discharge Diagnoses:  Principal Problem:   HTN (hypertension), malignant Active Problems:   Acute metabolic encephalopathy   Cerebral thrombosis with cerebral infarction   HTN (hypertension)      Discharge Instructions  Discharge Instructions     Ambulatory referral to Neurology   Complete by: As directed    An appointment is requested in approximately: 8 weeks   Diet - low sodium heart healthy   Complete by: As directed    Discharge instructions   Complete by: As directed    Follow with Primary MD Horald Pollen, MD in 10  days  Get CBC, CMP,  checked  by Primary MD next visit.    Activity: As tolerated with Full fall precautions use walker/cane & assistance as needed   Disposition Home    Diet: Heart Healthy , LOW SALT   On your next visit with your primary care physician please Get Medicines reviewed and  adjusted.   Please request your Prim.MD to go over all Hospital Tests and Procedure/Radiological results at the follow up, please get all Hospital records sent to your Prim MD by signing hospital release before you go home.   If you experience worsening of your admission symptoms, develop shortness of breath, life threatening emergency, suicidal or homicidal thoughts you must seek medical attention immediately by calling 911 or calling your MD immediately  if symptoms less severe.  You Must read complete instructions/literature along with all the possible adverse reactions/side effects for all the Medicines you take and that have been prescribed to you. Take any new Medicines after you have completely understood and accpet all the possible adverse reactions/side effects.   Do not drive, operating heavy machinery, perform activities at heights, swimming or participation in water activities or provide baby sitting services if your were admitted for syncope or siezures until you have seen by Primary MD or a Neurologist and advised to do so again.  Do not drive when taking Pain medications.    Do not take more than prescribed Pain, Sleep and Anxiety Medications  Special Instructions: If you have smoked or chewed Tobacco  in the last 2 yrs please stop smoking, stop any regular Alcohol  and or any Recreational drug use.  Wear Seat belts while driving.   Please note  You were cared for by a hospitalist during your hospital stay. If you have any questions about your discharge medications or the care you received while you were in the hospital after you are discharged, you can call the unit and asked to speak with the hospitalist on call if the hospitalist that took care of you is not available. Once you are discharged, your primary care physician will handle any further medical issues. Please note that NO REFILLS for any discharge medications will be authorized once you are discharged, as it is  imperative that you return to your primary care physician (or establish a relationship with a primary care physician if you do not have one) for your aftercare needs so that they can reassess your need for medications and monitor your lab values.   Increase activity slowly   Complete by: As directed       Allergies as of 10/11/2021   No Known Allergies      Medication List     STOP taking these medications    atenolol 50 MG tablet Commonly known as: TENORMIN   azithromycin 250 MG tablet Commonly known as: ZITHROMAX   predniSONE 20 MG tablet Commonly known as: DELTASONE   traMADol 50 MG tablet Commonly known as: ULTRAM       TAKE these medications    albuterol 108 (90 Base) MCG/ACT inhaler Commonly known as: VENTOLIN HFA Inhale 2 puffs into the lungs every 6 (six) hours as needed for wheezing or shortness of breath.   aspirin 81 MG EC tablet Take 1 tablet (81 mg total) by mouth daily. Swallow whole. Start taking on: October 12, 2021   atorvastatin 80 MG tablet Commonly known as: LIPITOR Take 1 tablet (80 mg total) by mouth daily. Start taking on: October 12, 2021   clopidogrel  75 MG tablet Commonly known as: PLAVIX Take 1 tablet (75 mg total) by mouth daily. Take for 20 days then stop. Start taking on: October 12, 2021   cyclobenzaprine 10 MG tablet Commonly known as: FLEXERIL Take 0.5-1 tablets (5-10 mg total) by mouth 3 (three) times daily as needed for muscle spasms.   lisinopril-hydrochlorothiazide 20-12.5 MG tablet Commonly known as: ZESTORETIC Take 2 tablets by mouth daily. Please start on 10/13/2021 Start taking on: October 13, 2021 What changed:  additional instructions These instructions start on October 13, 2021. If you are unsure what to do until then, ask your doctor or other care provider.   pseudoephedrine-guaifenesin 60-600 MG 12 hr tablet Commonly known as: MUCINEX D Take 1 tablet by mouth daily as needed for congestion.         Follow-up Information     Horald Pollen, MD Follow up in 10 day(s).   Specialty: Internal Medicine Contact information: [place] [address] [phone]                No Known Allergies  Consultations: Neurology EP cardiology   Procedures/Studies: CT HEAD WO CONTRAST (5MM)  Result Date: 10/09/2021 CLINICAL DATA:  Confusion. Head trauma. Elevated blood pressure. See this is wider than EXAM: CT HEAD WITHOUT CONTRAST TECHNIQUE: Contiguous axial images were obtained from the base of the skull through the vertex without intravenous contrast. COMPARISON:  None. FINDINGS: Brain: No evidence of acute infarction, hemorrhage, hydrocephalus, extra-axial collection or mass lesion/mass effect. Asymmetric right hippocampal atrophy with associated mild dilatation of the temporal horn of the right lateral ventricle. Vascular: No hyperdense vessel or unexpected calcification. Skull: Negative for fracture.  Hyperostosis frontalis interna. Sinuses/Orbits: No acute finding. Other: None. IMPRESSION: No acute intracranial abnormality. Electronically Signed   By: Ileana Roup M.D.   On: 10/09/2021 16:50   MR ANGIO HEAD WO CONTRAST  Result Date: 10/10/2021 CLINICAL DATA:  Infarcts seen on prior MRI EXAM: MRA HEAD WITHOUT CONTRAST TECHNIQUE: Angiographic images of the Circle of Willis were acquired using MRA technique without intravenous contrast. COMPARISON:  Brain MRI obtained earlier the same day, CT head obtained 1 day prior FINDINGS: Anterior circulation: The intracranial ICAs are patent. The bilateral MCAs are patent. The right A1 segment is hypoplastic or absent, a normal variant. The left A1 segment is patent. The bilateral ACAs are otherwise patent. The anterior communicating artery is patent. There is no aneurysm. Posterior circulation: The bilateral V4 segments are patent. The basilar artery is patent. There is multifocal irregularity of the right P1 and P2 segments. There  is focal severe stenosis of the proximal right P2 segment (2-100) and focal severe stenosis more distally in the right ambient cistern (2-121). The distal right PCA is slightly diminished in caliber compared to the left. The left PCA is patent. A small left posterior communicating artery is identified. The right posterior communicating artery is not definitely seen There is no aneurysm Anatomic variants: As above. Other: None. IMPRESSION: 1. 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 2. Otherwise, patent intracranial vasculature. Electronically Signed   By: Valetta Mole M.D.   On: 10/10/2021 15:38   MR BRAIN W WO CONTRAST  Result Date: 10/10/2021 CLINICAL DATA:  Mental status change, unknown cause EXAM: MRI HEAD WITHOUT AND WITH CONTRAST TECHNIQUE: Multiplanar, multiecho pulse sequences of the brain and surrounding structures were obtained without and with intravenous contrast. CONTRAST:  8.43mL GADAVIST GADOBUTROL 1 MMOL/ML IV SOLN COMPARISON:  No prior MRI, correlation is made with CT 10/09/2021. FINDINGS: Evaluation is somewhat limited by motion artifact. Brain: Restricted diffusion with ADC correlate in the right hippocampus, extending from anterior to posterior (series 5, images 65-73). This area is associated with mildly increased T2 signal, likely cytotoxic edema, but no significant contrast enhancement. Along the anterior right temporal lobe/inferior right frontal lobe, there is a 0.8 x 0.9 x 0.7 cm enhancing lesion (AP x TR x CC) (series 21, image 26 and series 23, image 7). This is somewhat poorly evaluated given motion artifact affecting several sequences, but is favored to be extra-axial, as a CSF cleft is suggested on coronal T2 sequence (series 20, image 19). No acute hemorrhage, mass effect, or midline shift. No hydrocephalus or extra-axial collection. Scattered T2 hyperintense signal in the  periventricular white matter, likely the sequela of mild chronic small vessel ischemic disease. Vascular: Normal flow voids. Skull and upper cervical spine: Normal marrow signal. Sinuses/Orbits: Mucosal thickening in the anterior ethmoid air cells. The orbits are unremarkable. Other: Trace fluid in the mastoid air cells. IMPRESSION: 1. Evaluation is somewhat limited by motion artifact. Within this limitation, there is an acute infarct extending from the anterior to posterior right hippocampus. 2. Subcentimeter enhancing lesion along the right inferior frontal lobe, favored to be extradural, although evaluation is somewhat limited by motion artifact. These results were called by telephone at the time of interpretation on 10/10/2021 at 3:08 am to provider Dr. Fabio Neighbors , who verbally acknowledged these results. Electronically Signed   By: Merilyn Baba M.D.   On: 10/10/2021 03:09   DG Chest Portable 1 View  Result Date: 10/09/2021 CLINICAL DATA:  Confusion.  Recent fall. EXAM: PORTABLE CHEST 1 VIEW COMPARISON:  None. FINDINGS: Of note, the patient is mildly rotated on this portable examination. Borderline enlarged cardiac silhouette. Both lungs are clear. Multilevel degenerative changes in the mid and distal thoracic spine. No acute osseous abnormality. IMPRESSION: No active disease. Electronically Signed   By: Ileana Roup M.D.   On: 10/09/2021 16:27   EEG adult  Result Date: 10/10/2021 Lora Havens, MD     10/10/2021  5:49 PM Patient Name: Darlene [name] MRN: [id number] Epilepsy Attending: Lora Havens Referring Physician/Provider: Albertine Patricia, MD Date: 10/10/2021 Duration: 46.51 mins Patient history: 70yo F with ams. EEG to evaluate for seizure Level of alertness: Awake, asleep AEDs during EEG study: None Technical aspects: This EEG study was done with scalp electrodes positioned according to the 10-20 International system of electrode placement. Electrical activity was acquired at a sampling  rate of 500Hz  and reviewed with a high frequency filter of 70Hz  and a low frequency filter of 1Hz . EEG data were recorded continuously and digitally stored. Description: The posterior dominant rhythm consists of 9 Hz activity of moderate voltage (25-35 uV) seen predominantly in posterior head regions, symmetric and reactive to eye opening and eye closing. Sleep was characterized by vertex waves, sleep spindles (12 to 14 Hz), maximal frontocentral region.  Hyperventilation and photic stimulation were not performed.   IMPRESSION: This study is within normal limits. No seizures or epileptiform discharges were seen throughout the recording. Lora Havens   ECHOCARDIOGRAM COMPLETE  Result Date: 10/10/2021    ECHOCARDIOGRAM REPORT   Patient Name:   Darlene [name] Date of Exam: 10/10/2021 Medical Rec #:  [id number]      Height:       65.0 in Accession #:  [id number]     Weight:       189.0 lb Date of Birth:  1951/04/21      BSA:          1.931 m Patient Age:    27 years       BP:           154/53 mmHg Patient Gender: F              HR:           56 bpm. Exam Location:  Inpatient Procedure: 2D Echo, 3D Echo, Cardiac Doppler and Color Doppler Indications:     Stroke  History:         Patient has no prior history of Echocardiogram examinations.                  Signs/Symptoms:Altered Mental Status; Risk                  Factors:Hypertension.  Sonographer:     Roseanna Rainbow RDCS Referring Phys:  [id number] [facility] Diagnosing Phys: Gwyndolyn Kaufman MD  Sonographer Comments: Technically difficult study due to poor echo windows and patient is morbidly obese. Image acquisition challenging due to patient body habitus. IMPRESSIONS  1. Left ventricular ejection fraction, by estimation, is 65 to 70%. Left ventricular ejection fraction by 3D volume is 65 %. The left ventricle has normal function. The left ventricle has no regional wall motion abnormalities. There is severe hypertrophy of the septum. The rest of the LV  segments demonstrate moderate left ventricular hypertrophy. Left ventricular diastolic parameters are consistent with Grade I diastolic dysfunction (impaired relaxation).  2. Right ventricular systolic function is normal. The right ventricular size is normal. There is mildly elevated pulmonary artery systolic pressure. The estimated right ventricular systolic pressure is 65.7 mmHg.  3. Left atrial size was severely dilated.  4. The mitral valve is normal in structure. Mild mitral valve regurgitation.  5. The aortic valve is tricuspid. There is mild calcification of the aortic valve. There is mild thickening of the aortic valve. Aortic valve regurgitation is not visualized. Aortic valve sclerosis is present, with no evidence of aortic valve stenosis.  6. The inferior vena cava is normal in size with <50% respiratory variability, suggesting right atrial pressure of 8 mmHg. Comparison(s): No prior Echocardiogram. Conclusion(s)/Recommendation(s): No intracardiac source of embolism detected on this transthoracic study. Consider a transesophageal echocardiogram to exclude cardiac source of embolism if clinically indicated. FINDINGS  Left Ventricle: Left ventricular ejection fraction, by estimation, is 65 to 70%. Left ventricular ejection fraction by 3D volume is 65 %. The left ventricle has normal function. The left ventricle has no regional wall motion abnormalities. The left ventricular internal cavity size was normal in size. There is severe hypertrophy of the septum. The rest of the LV segments demonstrate moderate left ventricular hypertrophy. Left ventricular diastolic parameters are consistent with Grade I diastolic dysfunction (impaired relaxation). Right Ventricle: The right ventricular size is normal. No increase in right ventricular wall thickness. Right ventricular systolic function is normal. There is mildly elevated pulmonary artery systolic pressure. The tricuspid regurgitant velocity is 2.75  m/s, and with  an assumed right atrial pressure of 8 mmHg, the estimated right ventricular systolic pressure is 84.6 mmHg. Left Atrium: Left atrial size was severely dilated. Right Atrium: Right atrial size was normal in size. Pericardium: There is no evidence of pericardial effusion. Mitral Valve: The mitral valve is normal in structure. There is mild thickening of  the mitral valve leaflet(s). Mild mitral valve regurgitation. Tricuspid Valve: The tricuspid valve is normal in structure. Tricuspid valve regurgitation is trivial. Aortic Valve: The aortic valve is tricuspid. There is mild calcification of the aortic valve. There is mild thickening of the aortic valve. Aortic valve regurgitation is not visualized. Aortic valve sclerosis is present, with no evidence of aortic valve stenosis. Pulmonic Valve: The pulmonic valve was normal in structure. Pulmonic valve regurgitation is trivial. Aorta: The aortic root and ascending aorta are structurally normal, with no evidence of dilitation. Venous: The inferior vena cava is normal in size with less than 50% respiratory variability, suggesting right atrial pressure of 8 mmHg. IAS/Shunts: The atrial septum is grossly normal.  LEFT VENTRICLE PLAX 2D LVIDd:         4.20 cm         Diastology LVIDs:         2.40 cm         LV e' medial:    4.90 cm/s LV PW:         1.10 cm         LV E/e' medial:  13.9 LV IVS:        1.50 cm         LV e' lateral:   6.74 cm/s                                LV E/e' lateral: 10.1                                 3D Volume EF                                LV 3D EF:    Left                                             ventricul                                             ar                                             ejection                                             fraction                                             by 3D  volume is                                             65 %.                                 3D  Volume EF:                                3D EF:        65 %                                LV EDV:       130 ml                                LV ESV:       45 ml                                LV SV:        85 ml RIGHT VENTRICLE             IVC RV S prime:     15.40 cm/s  IVC diam: 1.80 cm TAPSE (M-mode): 2.0 cm LEFT ATRIUM             Index        RIGHT ATRIUM           Index LA diam:        4.60 cm 2.38 cm/m   RA Area:     18.20 cm LA Vol (A2C):   80.4 ml 41.63 ml/m  RA Volume:   54.10 ml  28.01 ml/m LA Vol (A4C):   87.8 ml 45.47 ml/m LA Biplane Vol: 90.0 ml 46.60 ml/m  AORTIC VALVE LVOT Vmax:   156.00 cm/s LVOT Vmean:  108.000 cm/s LVOT VTI:    0.365 m  AORTA Ao Root diam: 2.70 cm Ao Asc diam:  3.10 cm MITRAL VALVE               TRICUSPID VALVE MV Area (PHT): 2.76 cm    TR Peak grad:   30.2 mmHg MV Decel Time: 275 msec    TR Vmax:        275.00 cm/s MR PISA:        2.26 cm MR PISA Radius: 0.60 cm    SHUNTS MV E velocity: 68.00 cm/s  Systemic VTI: 0.36 m MV A velocity: 77.20 cm/s MV E/A ratio:  0.88 Gwyndolyn Kaufman MD Electronically signed by Gwyndolyn Kaufman MD Signature Date/Time: 10/10/2021/2:13:52 PM    Final (Updated)    VAS US CAROTID  Result Date: 10/10/2021 Carotid Arterial Duplex Study Patient Name:  Darlene [name]  Date of Exam:   10/10/2021 Medical Rec #: [id number]       Accession #:    [id number] Date of Birth: 11/08/1950       Patient Gender: F Patient Age:   2 years Exam Location:  [facility] Procedure:      VAS US CAROTID Referring Phys: Alferd Patee [facility] --------------------------------------------------------------------------------  Indications:       CVA and Altered mental status. Risk Factors:      Hypertension. Comparison Study:  No prior study on file Performing Technologist: Sharion Dove RVS  Examination Guidelines: A complete evaluation includes B-mode imaging, spectral Doppler, color Doppler, and power Doppler as needed of all accessible portions of each  vessel. Bilateral testing is considered an integral part of a complete examination. Limited examinations for reoccurring indications may be performed as noted.  Right Carotid Findings: +----------+--------+--------+--------+------------------+------------------+             PSV cm/s EDV cm/s Stenosis Plaque Description Comments            +----------+--------+--------+--------+------------------+------------------+  CCA Prox   112      13                                   intimal thickening  +----------+--------+--------+--------+------------------+------------------+  CCA Distal 88       13                                   intimal thickening  +----------+--------+--------+--------+------------------+------------------+  ICA Prox   83       13                homogeneous                            +----------+--------+--------+--------+------------------+------------------+  ICA Distal 69       14                                                       +----------+--------+--------+--------+------------------+------------------+  ECA        108      11                                                       +----------+--------+--------+--------+------------------+------------------+ +----------+--------+-------+--------+-------------------+             PSV cm/s EDV cms Describe Arm Pressure (mmHG)  +----------+--------+-------+--------+-------------------+  Subclavian 211                                            +----------+--------+-------+--------+-------------------+ +---------+--------+---+--------+--+  Vertebral PSV cm/s 106 EDV cm/s 17  +---------+--------+---+--------+--+  Left Carotid Findings: +----------+--------+--------+--------+------------------+------------------+             PSV cm/s EDV cm/s Stenosis Plaque Description Comments            +----------+--------+--------+--------+------------------+------------------+  CCA Prox   151      20                                   intimal thickening   +----------+--------+--------+--------+------------------+------------------+  CCA Distal 130      20  intimal thickening  +----------+--------+--------+--------+------------------+------------------+  ICA Prox   93       21                                                       +----------+--------+--------+--------+------------------+------------------+  ICA Distal 120      18                                                       +----------+--------+--------+--------+------------------+------------------+  ECA        74       12                                                       +----------+--------+--------+--------+------------------+------------------+ +----------+--------+--------+--------+-------------------+             PSV cm/s EDV cm/s Describe Arm Pressure (mmHG)  +----------+--------+--------+--------+-------------------+  Subclavian 120                                             +----------+--------+--------+--------+-------------------+ +---------+--------+---+--------+--+  Vertebral PSV cm/s 101 EDV cm/s 16  +---------+--------+---+--------+--+   Summary: Right Carotid: The extracranial vessels were near-normal with only minimal wall                thickening or plaque. Left Carotid: The extracranial vessels were near-normal with only minimal wall               thickening or plaque. Vertebrals:  Bilateral vertebral arteries demonstrate antegrade flow. Subclavians: Normal flow hemodynamics were seen in bilateral subclavian              arteries. *See table(s) above for measurements and observations.     Preliminary       Subjective: Patient denies any focal deficits today, no tingling, no numbness, she is eager to go home.  Discharge Exam: Vitals:   10/11/21 0506 10/11/21 0753  BP: (!) 154/87 (!) 153/50  Pulse: (!) 57   Resp: 20 (!) 29  Temp: 98.4 F (36.9 C) 98.6 F (37 C)  SpO2: 97%    Vitals:   10/10/21 2100 10/10/21 2328 10/11/21 0506 10/11/21  0753  BP:  (!) 146/69 (!) 154/87 (!) 153/50  Pulse:  63 (!) 57   Resp:  20 20 (!) 29  Temp:  98.9 F (37.2 C) 98.4 F (36.9 C) 98.6 F (37 C)  TempSrc:  Oral Oral   SpO2:  95% 97%   Weight: 88.9 kg     Height: 5\' 5"  (1.651 m)       General: Pt is alert, awake, not in acute distress Cardiovascular: RRR, S1/S2 +, no rubs, no gallops Respiratory: CTA bilaterally, no wheezing, no rhonchi Abdominal: Soft, NT, ND, bowel sounds + Extremities: no edema, no cyanosis    The results of significant diagnostics from this hospitalization (including imaging, microbiology, ancillary and laboratory) are listed below for reference.  Microbiology: Recent Results (from the past 240 hour(s))  Resp Panel by RT-PCR (Flu A&B, Covid) Nasopharyngeal Swab     Status: None   Collection Time: 10/09/21  4:09 PM   Specimen: Nasopharyngeal Swab; Nasopharyngeal(NP) swabs in vial transport medium  Result Value Ref Range Status   SARS Coronavirus 2 by RT PCR NEGATIVE NEGATIVE Final    Comment: (NOTE) SARS-CoV-2 target nucleic acids are NOT DETECTED.  The SARS-CoV-2 RNA is generally detectable in upper respiratory specimens during the acute phase of infection. The lowest concentration of SARS-CoV-2 viral copies this assay can detect is 138 copies/mL. A negative result does not preclude SARS-Cov-2 infection and should not be used as the sole basis for treatment or other patient management decisions. A negative result may occur with  improper specimen collection/handling, submission of specimen other than nasopharyngeal swab, presence of viral mutation(s) within the areas targeted by this assay, and inadequate number of viral copies(<138 copies/mL). A negative result must be combined with clinical observations, patient history, and epidemiological information. The expected result is Negative.  Fact Sheet for Patients:  EntrepreneurPulse.com.au  Fact Sheet for Healthcare Providers:   IncredibleEmployment.be  This test is no t yet approved or cleared by the [place] FDA and  has been authorized for detection and/or diagnosis of SARS-CoV-2 by FDA under an Emergency Use Authorization (EUA). This EUA will remain  in effect (meaning this test can be used) for the duration of the COVID-19 declaration under Section 564(b)(1) of the Act, 21 U.S.C.section 360bbb-3(b)(1), unless the authorization is terminated  or revoked sooner.       Influenza A by PCR NEGATIVE NEGATIVE Final   Influenza B by PCR NEGATIVE NEGATIVE Final    Comment: (NOTE) The Xpert Xpress SARS-CoV-2/FLU/RSV plus assay is intended as an aid in the diagnosis of influenza from Nasopharyngeal swab specimens and should not be used as a sole basis for treatment. Nasal washings and aspirates are unacceptable for Xpert Xpress SARS-CoV-2/FLU/RSV testing.  Fact Sheet for Patients: EntrepreneurPulse.com.au  Fact Sheet for Healthcare Providers: IncredibleEmployment.be  This test is not yet approved or cleared by the [place] FDA and has been authorized for detection and/or diagnosis of SARS-CoV-2 by FDA under an Emergency Use Authorization (EUA). This EUA will remain in effect (meaning this test can be used) for the duration of the COVID-19 declaration under Section 564(b)(1) of the Act, 21 U.S.C. section 360bbb-3(b)(1), unless the authorization is terminated or revoked.  Performed at [facility], [place] [address] [place], [place] [address]   MRSA Next Gen by PCR, Nasal     Status: None   Collection Time: 10/09/21  9:09 PM   Specimen: Nasal Mucosa; Nasal Swab  Result Value Ref Range Status   MRSA by PCR Next Gen NOT DETECTED NOT DETECTED Final    Comment: (NOTE) The GeneXpert MRSA Assay (FDA approved for NASAL specimens only), is one component of a comprehensive MRSA colonization surveillance program. It is not intended to diagnose  MRSA infection nor to guide or monitor treatment for MRSA infections. Test performance is not FDA approved in patients less than 65 years old. Performed at [facility], [place] [address], [place], [place] [address]      Labs: BNP (last 3 results) Recent Labs    10/09/21 1607  BNP 170.0*   Basic Metabolic Panel: Recent Labs  Lab 10/09/21 1503 10/10/21 0015 10/11/21 0106  NA 139 138 138  K 3.6 3.2* 4.1  CL 106 105 109  CO2 28 25 24   GLUCOSE 108* 95 101*  BUN 9 8 19   CREATININE 1.26* 1.20* 1.41*  CALCIUM 9.3 9.3 9.3   Liver Function Tests: Recent Labs  Lab 10/09/21 1503  AST 17  ALT 20  ALKPHOS 69  BILITOT 1.1  PROT 7.3  ALBUMIN 3.6   No results for input(s): LIPASE, AMYLASE in the last 168 hours. No results for input(s): AMMONIA in the last 168 hours. CBC: Recent Labs  Lab 10/09/21 1503 10/11/21 0106  WBC 6.8 6.9  HGB 12.1 12.5  HCT 38.3 38.9  MCV 96.5 95.3  PLT 239 232   Cardiac Enzymes: No results for input(s): CKTOTAL, CKMB, CKMBINDEX, TROPONINI in the last 168 hours. BNP: Invalid input(s): POCBNP CBG: No results for input(s): GLUCAP in the last 168 hours. D-Dimer No results for input(s): DDIMER in the last 72 hours. Hgb A1c Recent Labs    10/10/21 0500  HGBA1C 5.7*   Lipid Profile Recent Labs    10/09/21 1827  CHOL 208*  HDL 41  LDLCALC 155*  TRIG 59  CHOLHDL 5.1   Thyroid function studies No results for input(s): TSH, T4TOTAL, T3FREE, THYROIDAB in the last 72 hours.  Invalid input(s): FREET3 Anemia work up No results for input(s): VITAMINB12, FOLATE, FERRITIN, TIBC, IRON, RETICCTPCT in the last 72 hours. Urinalysis    Component Value Date/Time   COLORURINE YELLOW 10/09/2021 1540   APPEARANCEUR CLEAR 10/09/2021 1540   LABSPEC 1.025 10/09/2021 1540   PHURINE 6.0 10/09/2021 1540   GLUCOSEU NEGATIVE 10/09/2021 1540   HGBUR NEGATIVE 10/09/2021 1540   BILIRUBINUR NEGATIVE 10/09/2021 1540   KETONESUR NEGATIVE 10/09/2021  1540   PROTEINUR 30 (A) 10/09/2021 1540   NITRITE NEGATIVE 10/09/2021 1540   LEUKOCYTESUR NEGATIVE 10/09/2021 1540   Sepsis Labs Invalid input(s): PROCALCITONIN,  WBC,  LACTICIDVEN Microbiology Recent Results (from the past 240 hour(s))  Resp Panel by RT-PCR (Flu A&B, Covid) Nasopharyngeal Swab     Status: None   Collection Time: 10/09/21  4:09 PM   Specimen: Nasopharyngeal Swab; Nasopharyngeal(NP) swabs in vial transport medium  Result Value Ref Range Status   SARS Coronavirus 2 by RT PCR NEGATIVE NEGATIVE Final    Comment: (NOTE) SARS-CoV-2 target nucleic acids are NOT DETECTED.  The SARS-CoV-2 RNA is generally detectable in upper respiratory specimens during the acute phase of infection. The lowest concentration of SARS-CoV-2 viral copies this assay can detect is 138 copies/mL. A negative result does not preclude SARS-Cov-2 infection and should not be used as the sole basis for treatment or other patient management decisions. A negative result may occur with  improper specimen collection/handling, submission of specimen other than nasopharyngeal swab, presence of viral mutation(s) within the areas targeted by this assay, and inadequate number of viral copies(<138 copies/mL). A negative result must be combined with clinical observations, patient history, and epidemiological information. The expected result is Negative.  Fact Sheet for Patients:  EntrepreneurPulse.com.au  Fact Sheet for Healthcare Providers:  IncredibleEmployment.be  This test is no t yet approved or cleared by the [place] FDA and  has been authorized for detection and/or diagnosis of SARS-CoV-2 by FDA under an Emergency Use Authorization (EUA). This EUA will remain  in effect (meaning this test can be used) for the duration of the COVID-19 declaration under Section 564(b)(1) of the Act, 21 U.S.C.section 360bbb-3(b)(1), unless the authorization is terminated  or  revoked sooner.       Influenza A by PCR NEGATIVE NEGATIVE Final   Influenza B by PCR  NEGATIVE NEGATIVE Final    Comment: (NOTE) The Xpert Xpress SARS-CoV-2/FLU/RSV plus assay is intended as an aid in the diagnosis of influenza from Nasopharyngeal swab specimens and should not be used as a sole basis for treatment. Nasal washings and aspirates are unacceptable for Xpert Xpress SARS-CoV-2/FLU/RSV testing.  Fact Sheet for Patients: EntrepreneurPulse.com.au  Fact Sheet for Healthcare Providers: IncredibleEmployment.be  This test is not yet approved or cleared by the [place] FDA and has been authorized for detection and/or diagnosis of SARS-CoV-2 by FDA under an Emergency Use Authorization (EUA). This EUA will remain in effect (meaning this test can be used) for the duration of the COVID-19 declaration under Section 564(b)(1) of the Act, 21 U.S.C. section 360bbb-3(b)(1), unless the authorization is terminated or revoked.  Performed at [facility], [place] [address] [place], [place] [address]   MRSA Next Gen by PCR, Nasal     Status: None   Collection Time: 10/09/21  9:09 PM   Specimen: Nasal Mucosa; Nasal Swab  Result Value Ref Range Status   MRSA by PCR Next Gen NOT DETECTED NOT DETECTED Final    Comment: (NOTE) The GeneXpert MRSA Assay (FDA approved for NASAL specimens only), is one component of a comprehensive MRSA colonization surveillance program. It is not intended to diagnose MRSA infection nor to guide or monitor treatment for MRSA infections. Test performance is not FDA approved in patients less than 56 years old. Performed at [facility], [place] [address] [place], [place] [address]      Time coordinating discharge: Over 30 minutes  SIGNED:   Phillips Climes, MD  [facility] 10/11/2021, 2:58 PM Pager   If 7PM-7AM, please contact night-coverage [email or web address] Password [email or web address]

## 2021-10-11 NOTE — Progress Notes (Signed)
STROKE TEAM PROGRESS NOTE   INTERVAL HISTORY Her daughter and husband are at the bedside.  Patient is sitting up in the chair.  Patient states she is doing well.  MR angiogram of the brain shows tandem stenosis of right posterior cerebral artery.  No aneurysm.  Echocardiogram shows normal ejection fraction without cardiac source of embolism.  Carotid ultrasound shows no significant extracranial stenosis.  EEG was normal.  Vital signs stable.  Neurological exam unchanged.  Vitals:   10/10/21 2100 10/10/21 2328 10/11/21 0506 10/11/21 0753  BP:  (!) 146/69 (!) 154/87 (!) 153/50  Pulse:  63 (!) 57   Resp:  20 20 (!) 29  Temp:  98.9 F (37.2 C) 98.4 F (36.9 C) 98.6 F (37 C)  TempSrc:  Oral Oral   SpO2:  95% 97%   Weight: 88.9 kg     Height: 5\' 5"  (1.651 m)      CBC:  Recent Labs  Lab 10/09/21 1503 10/11/21 0106  WBC 6.8 6.9  HGB 12.1 12.5  HCT 38.3 38.9  MCV 96.5 95.3  PLT 239 657   Basic Metabolic Panel:  Recent Labs  Lab 10/10/21 0015 10/11/21 0106  NA 138 138  K 3.2* 4.1  CL 105 109  CO2 25 24  GLUCOSE 95 101*  BUN 8 19  CREATININE 1.20* 1.41*  CALCIUM 9.3 9.3   Lipid Panel:  Recent Labs  Lab 10/09/21 1827  CHOL 208*  TRIG 59  HDL 41  CHOLHDL 5.1  VLDL 12  LDLCALC 155*   HgbA1c:  Recent Labs  Lab 10/10/21 0500  HGBA1C 5.7*   Urine Drug Screen: No results for input(s): LABOPIA, COCAINSCRNUR, LABBENZ, AMPHETMU, THCU, LABBARB in the last 168 hours.  Alcohol Level No results for input(s): ETH in the last 168 hours.  IMAGING past 24 hours MR ANGIO HEAD WO CONTRAST  Result Date: 10/10/2021 CLINICAL DATA:  Infarcts seen on prior MRI EXAM: MRA HEAD WITHOUT CONTRAST TECHNIQUE: Angiographic images of the Circle of Willis were acquired using MRA technique without intravenous contrast. COMPARISON:  Brain MRI obtained earlier the same day, CT head obtained 1 day prior FINDINGS: Anterior circulation: The intracranial ICAs are patent. The bilateral MCAs are  patent. The right A1 segment is hypoplastic or absent, a normal variant. The left A1 segment is patent. The bilateral ACAs are otherwise patent. The anterior communicating artery is patent. There is no aneurysm. Posterior circulation: The bilateral V4 segments are patent. The basilar artery is patent. There is multifocal irregularity of the right P1 and P2 segments. There is focal severe stenosis of the proximal right P2 segment (2-100) and focal severe stenosis more distally in the right ambient cistern (2-121). The distal right PCA is slightly diminished in caliber compared to the left. The left PCA is patent. A small left posterior communicating artery is identified. The right posterior communicating artery is not definitely seen There is no aneurysm Anatomic variants: As above. Other: None. IMPRESSION: 1. 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 2. Otherwise, patent intracranial vasculature. Electronically Signed   By: Valetta Mole M.D.   On: 10/10/2021 15:38   EEG adult  Result Date: 10/10/2021 Lora Havens, MD     10/10/2021  5:49 PM Patient Name: Darlene [name] MRN: [id number] Epilepsy Attending: Lora Havens Referring Physician/Provider: Albertine Patricia, MD Date: 10/10/2021 Duration: 46.51 mins Patient history: 70yo F with ams.  EEG to evaluate for seizure Level of alertness: Awake, asleep AEDs during EEG study: None Technical aspects: This EEG study was done with scalp electrodes positioned according to the 10-20 International system of electrode placement. Electrical activity was acquired at a sampling rate of 500Hz  and reviewed with a high frequency filter of 70Hz  and a low frequency filter of 1Hz . EEG data were recorded continuously and digitally stored. Description: The posterior dominant rhythm consists of 9 Hz activity of moderate voltage (25-35 uV) seen predominantly in posterior head  regions, symmetric and reactive to eye opening and eye closing. Sleep was characterized by vertex waves, sleep spindles (12 to 14 Hz), maximal frontocentral region.  Hyperventilation and photic stimulation were not performed.   IMPRESSION: This study is within normal limits. No seizures or epileptiform discharges were seen throughout the recording. Priyanka O Yadav   VAS US CAROTID  Result Date: 10/10/2021 Carotid Arterial Duplex Study Patient Name:  Darlene [name]  Date of Exam:   10/10/2021 Medical Rec #: [id number]       Accession #:    [id number] Date of Birth: [birth year]       Patient Gender: F Patient Age:   70 years Exam Location:  [facility] Procedure:      VAS US CAROTID Referring Phys: Alferd P[facility] --------------------------------------------------------------------------------  Indications:       CVA and Altered mental status. Risk Factors:      Hypertension. Comparison Study:  No prior study on file Performing Technologist: Sharion Dove RVS  Examination Guidelines: A complete evaluation includes B-mode imaging, spectral Doppler, color Doppler, and power Doppler as needed of all accessible portions of each vessel. Bilateral testing is considered an integral part of a complete examination. Limited examinations for reoccurring indications may be performed as noted.  Right Carotid Findings: +----------+--------+--------+--------+------------------+------------------+             PSV cm/s EDV cm/s Stenosis Plaque Description Comments            +----------+--------+--------+--------+------------------+------------------+  CCA Prox   112      13                                   intimal thickening  +----------+--------+--------+--------+------------------+------------------+  CCA Distal 88       13                                   intimal thickening  +----------+--------+--------+--------+------------------+------------------+  ICA Prox   83       13                homogeneous                             +----------+--------+--------+--------+------------------+------------------+  ICA Distal 69       14                                                       +----------+--------+--------+--------+------------------+------------------+  ECA        108      11                                                       +----------+--------+--------+--------+------------------+------------------+ +----------+--------+-------+--------+-------------------+  PSV cm/s EDV cms Describe Arm Pressure (mmHG)  +----------+--------+-------+--------+-------------------+  Subclavian 211                                            +----------+--------+-------+--------+-------------------+ +---------+--------+---+--------+--+  Vertebral PSV cm/s 106 EDV cm/s 17  +---------+--------+---+--------+--+  Left Carotid Findings: +----------+--------+--------+--------+------------------+------------------+             PSV cm/s EDV cm/s Stenosis Plaque Description Comments            +----------+--------+--------+--------+------------------+------------------+  CCA Prox   151      20                                   intimal thickening  +----------+--------+--------+--------+------------------+------------------+  CCA Distal 130      20                                   intimal thickening  +----------+--------+--------+--------+------------------+------------------+  ICA Prox   93       21                                                       +----------+--------+--------+--------+------------------+------------------+  ICA Distal 120      18                                                       +----------+--------+--------+--------+------------------+------------------+  ECA        74       12                                                       +----------+--------+--------+--------+------------------+------------------+ +----------+--------+--------+--------+-------------------+             PSV cm/s EDV cm/s Describe Arm  Pressure (mmHG)  +----------+--------+--------+--------+-------------------+  Subclavian 120                                             +----------+--------+--------+--------+-------------------+ +---------+--------+---+--------+--+  Vertebral PSV cm/s 101 EDV cm/s 16  +---------+--------+---+--------+--+   Summary: Right Carotid: The extracranial vessels were near-normal with only minimal wall                thickening or plaque. Left Carotid: The extracranial vessels were near-normal with only minimal wall               thickening or plaque. Vertebrals:  Bilateral vertebral arteries demonstrate antegrade flow. Subclavians: Normal flow hemodynamics were seen in bilateral subclavian              arteries. *See table(s) above for measurements and observations.     Preliminary     PHYSICAL EXAM  Temp:  [98.4 F (36.9 C)-98.9 F (37.2 C)] 98.6 F (37 C) (12/19 0753) Pulse Rate:  [57-63] 57 (12/19 0506) Resp:  [20-29] 29 (12/19 0753) BP: (146-154)/(50-87) 153/50 (12/19 0753) SpO2:  [95 %-97 %] 97 % (12/19 0506) Weight:  [88.9 kg] 88.9 kg (12/18 2100)  General - Well nourished, well developed, elderly lady in no apparent distress. Ophthalmologic - fundi not visualized due to noncooperation. Cardiovascular - Regular rhythm and rate.  Mental Status -  Level of arousal and orientation to place and person were intact.Patient was not oriented to time. Short term memory is not intact with some impairment of the long term memory as well.  She was able to do basic addition, but she was not able to remember 3 words that were told to her.  She stated that she did not remember being given the instruction to remember the 3 words.  She was able to name only 6 animals which can walk on 4 legs.  Cranial Nerves II - XII - II - Visual acuity intact OU.  Visual fields full III, IV, VI - Extraocular movements intact. V - Facial sensation intact bilaterally. VII - Facial movement intact bilaterally. VIII - Hearing  & vestibular intact bilaterally. X - Palate elevates symmetrically. XI - Chin turning & shoulder shrug intact bilaterally. XII - Tongue protrusion intact.  Motor Strength - The patients strength was normal in all extremities and pronator drift was absent.  Bulk was normal and fasciculations were absent.   Motor Tone - Muscle tone was assessed at the neck and appendages and was normal. Reflexes - The patients reflexes were symmetrical in all extremities and she had no pathological reflexes. Sensory - Light touch, temperature/pinprick were assessed and were symmetrical.   Coordination - The patient had normal movements in the hands and feet with no ataxia or dysmetria.  Tremor was absent. Gait and Station - deferred.   ASSESSMENT/PLAN Darlene [name] is a 70 y.o. female with history of hypertension presenting with 3 days of confusion, but no difficulty speaking.  Her daughter reports that she was on the phone with the patient about 3 days ago noted that she was not making sense.  She was talking about calling her boss at [employer] to let them know she would not be able to come to work, however she has not worked there for years.  Her daughter flew down from [place] to check on her and bring her to the ED.  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  Her MRI was motion degraded, MRA brain shows tandem stenosis of the right PCA.  We may repeat the MRI as an outpatient. 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  Enhancing lesion anterior right temporal/frontal lobe ?  Meningioma Code Stroke -CT head  Asymmetric right hippocampal atrophy  MRI-  acute infarct extending from the anterior to posterior right hippocampus MRA is of the right PCA.  Carotid Doppler no significant extracranial stenosis.  2D Echo ejection fraction 65 to 70%.   LDL 155 HgbA1c 5.7 VTE prophylaxis -Lovenox  40 mg No antithrombotic prior to admission, now on aspirin 81 mg daily and clopidogrel 75 mg daily.  For 3 weeks followed by aspirin alone Therapy recommendations: No PT follow-up recommended, outpatient neuro OT Disposition: Pending  Hypertension Home meds: Lisinopril- Hydrochlorothiazide 20-12.5 mg Stable Long-term BP goal normotensive As needed hydralazine ordered for blood pressure  greater than 180  Hyperlipidemia Home meds: None LDL 155, goal < 70 Add atorvastatin 80 mg High intensity statin  Continue statin at discharge  Other Stroke Risk Factors Advanced Age >/= 7  Obesity, Body mass index is 32.62 kg/m., BMI >/= 30 associated with increased stroke risk, recommend weight loss, diet and exercise as appropriate  Coronary artery disease Migraines Obstructive sleep apnea, on CPAP at home Congestive heart failure  Other Active Problems Bradycardia Primary team holding atenolol  Hospital day # 1    Patient seems to doing better today.  Carotid ultrasound and echocardiogram were unremarkable.  She needs a loop recorder prior to discharge to monitor for paroxysmal A. fib.  We will also need outpatient repeat MRI scan with and without contrast to evaluate for possible right frontal meningioma which was incompletely visualized.  Aspirin and Plavix for stroke prevention for 3 weeks followed by aspirin alone.  Long discussion with patient and her husband and daughter at the bedside and answered questions.  Discussed with Dr.Dawood.  Greater than 50% time during this 25-minute visit for spent on counseling and coordination of care about her embolic stroke and possible meningioma and answering questions.  Stroke team will sign off.  Follow-up with outpatient stroke clinic in 2 months.  Antony Contras, MD Medical Director [facility] Pager: [phone] 10/11/2021 1:49 PM  To contact Stroke Continuity provider, please refer to [email or web address] After hours, contact General  Neurology

## 2021-10-11 NOTE — TOC Transition Note (Signed)
Transition of Care [facility]) - CM/SW Discharge Note   Patient Details  Name: Darlene [name] MRN: [id number] Date of Birth: 27-Nov-1950  [facility]) CM/SW Contact:  Bartholomew Crews, RN Phone Number: [phone] 10/11/2021, 3:11 PM   Clinical Narrative:     Patient to transition home today. Recommendations for outpatient OT for cognition.   Final next level of care: OP Rehab Barriers to Discharge: No Barriers Identified   Patient Goals and CMS Choice Patient states their goals for this hospitalization and ongoing recovery are:: home CMS Medicare.gov Compare Post Acute Care list provided to:: Patient Choice offered to / list presented to : Patient  Discharge Placement                       Discharge Plan and Services                                     Social Determinants of Health (SDOH) Interventions     Readmission Risk Interventions No flowsheet data found.

## 2021-10-11 NOTE — Progress Notes (Signed)
Physical Therapy Treatment Patient Details Name: Darlene [name] MRN: [id number] DOB: [birth year] Today's Date: 10/11/2021   History of Present Illness 70 y.o. female presents to [facility] hospital on 10/09/2021 with persistent confusion and tingling in finger tips. 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 PMH includes HTN.    PT Comments    Pt doing well with mobility and no further PT needed.  Ready for dc from PT standpoint. Will need some supervision for cognition.     Recommendations for follow up therapy are one component of a multi-disciplinary discharge planning process, led by the attending physician.  Recommendations may be updated based on patient status, additional functional criteria and insurance authorization.  Follow Up Recommendations  No PT follow up     Assistance Recommended at Discharge Frequent or constant Supervision/Assistance (for cognition)  Equipment Recommendations  None recommended by PT    Recommendations for Other Services       Precautions / Restrictions Precautions Precautions: None     Mobility  Bed Mobility               General bed mobility comments: Pt up in bathroom    Transfers Overall transfer level: Independent   Transfers: Sit to/from Stand Sit to Stand: Independent                Ambulation/Gait Ambulation/Gait assistance: Independent Gait Distance (Feet): 500 Feet Assistive device: None Gait Pattern/deviations: WFL(Within Functional Limits) Gait velocity: functional Gait velocity interpretation: >2.62 ft/sec, indicative of community ambulatory   General Gait Details: Steady gait without any signs of instability   Stairs             Wheelchair Mobility    Modified Rankin (Stroke Patients Only) Modified Rankin (Stroke Patients Only) Pre-Morbid Rankin Score: No symptoms Modified Rankin: Moderate disability     Balance                                  Standardized Balance Assessment Standardized Balance Assessment : Dynamic Gait Index   Dynamic Gait Index Level Surface: Normal Change in Gait Speed: Normal Gait with Horizontal Head Turns: Normal Gait with Vertical Head Turns: Normal Gait and Pivot Turn: Normal Step Over Obstacle: Mild Impairment Step Around Obstacles: Normal Steps: Mild Impairment Total Score: 22      Cognition Arousal/Alertness: Awake/alert Behavior During Therapy: WFL for tasks assessed/performed Overall Cognitive Status: Impaired/Different from baseline Area of Impairment: Memory;Following commands;Safety/judgement;Awareness                     Memory: Decreased short-term memory Following Commands: Follows multi-step commands inconsistently Safety/Judgement: Decreased awareness of safety Awareness: Intellectual   General Comments: Pt continues to insist she did not retire from her job at [employer] and that she took a break.        Exercises      General Comments General comments (skin integrity, edema, etc.): VSS on RA      Pertinent Vitals/Pain Pain Assessment: No/denies pain    Home Living                          Prior Function            PT Goals (current goals can now be found in the care plan section) Acute Rehab PT Goals Patient Stated Goal: go  home Progress towards PT goals: Goals met/education completed, patient discharged from PT    Frequency           PT Plan Current plan remains appropriate    Co-evaluation              AM-PAC PT "6 Clicks" Mobility   Outcome Measure  Help needed turning from your back to your side while in a flat bed without using bedrails?: None Help needed moving from lying on your back to sitting on the side of a flat bed without using bedrails?: None Help needed moving to and from a bed to a chair (including a wheelchair)?: None Help needed standing up from a chair using your  arms (e.g., wheelchair or bedside chair)?: None Help needed to walk in hospital room?: None Help needed climbing 3-5 steps with a railing? : None 6 Click Score: 24    End of Session   Activity Tolerance: Patient tolerated treatment well Patient left: in bed;with call bell/phone within reach;with family/visitor present (sitting EOB)   PT Visit Diagnosis: Other symptoms and signs involving the nervous system (R29.898)     Time: 0964-3838 PT Time Calculation (min) (ACUTE ONLY): 12 min  Charges:  $Gait Training: 8-22 mins                     [place] Pager [phone] Office [place] 10/11/2021, 4:00 PM

## 2021-10-13 ENCOUNTER — Other Ambulatory Visit: Payer: Self-pay

## 2021-10-13 ENCOUNTER — Ambulatory Visit: Payer: Medicare Other | Attending: Internal Medicine | Admitting: Occupational Therapy

## 2021-10-13 DIAGNOSIS — R41844 Frontal lobe and executive function deficit: Secondary | ICD-10-CM | POA: Insufficient documentation

## 2021-10-13 DIAGNOSIS — R41840 Attention and concentration deficit: Secondary | ICD-10-CM | POA: Diagnosis not present

## 2021-10-13 NOTE — Therapy (Signed)
[facility] [address] [place], [place], [address] Phone: [phone]   Fax:  [phone]  Occupational Therapy Evaluation  Patient Details  Name: Darlene [name] MRN: [id number] Date of Birth: Dec 13, 1950 Referring Provider (OT): Dr. Leonie Man   Encounter Date: 10/13/2021   OT End of Session - 10/13/21 1530     Visit Number 1    Number of Visits 25    Date for OT Re-Evaluation 01/05/22    Authorization Type Medicare    Authorization - Visit Number 1    Progress Note Due on Visit 10    OT Start Time 1418    OT Stop Time 1500    OT Time Calculation (min) 42 min    Activity Tolerance Patient limited by lethargy    Behavior During Therapy Flat affect             Past Medical History:  Diagnosis Date   Hypertension     No past surgical history on file.  There were no vitals filed for this visit.   Subjective Assessment - 10/13/21 1421     Subjective  They say I had a stroke    Patient Stated Goals Pt's dtr states memory issues,    Currently in Pain? No/denies               [facility] OT Assessment - 10/13/21 1522       Assessment   Medical Diagnosis CVA    Referring Provider (OT) Dr. Leonie Man    Onset Date/Surgical Date 10/09/21    Hand Dominance Right      Balance Screen   Has the patient fallen in the past 6 months No    Has the patient had a decrease in activity level because of a fear of falling?  No    Is the patient reluctant to leave their home because of a fear of falling?  No      Home  Environment   Family/patient expects to be discharged to: Private residence    Living Arrangements Spouse/significant other   son   Home Access Stairs   1 step   Home Layout Two level    Bathroom Shower/Tub Tub/Shower unit    Lives With Spouse;Son      Prior Function   Level of Independence Independent    Vocation Part time employment    Cytogeneticist at [place]    Eating/Feeding Modified independent    Grooming Modified independent    Upper Body Bathing Independent    Lower Body Bathing Modified independent    Upper Body Dressing Independent    Lower Body Dressing Independent    Tub/Shower Transfer Modified independent      IADL   Shopping Needs to be accompanied on any shopping trip    Light Housekeeping Needs help with all home maintenance tasks    Meal Prep Needs to have meals prepared and served    Medication Management Is responsible for taking medication in correct dosages at correct time    Financial Management Requires assistance      Mobility   Mobility Status Independent      Written Expression   Dominant Hand Right    Handwriting 100% legible      Vision - History   Patient Visual Report --   denies changes     Vision Assessment   Vision Assessment Vision not tested      Cognition  Overall Cognitive Status Impaired/Different from baseline    Area of Impairment Orientation;Attention;Memory;Safety/judgement;Awareness;Problem solving    Current Attention Level Sustained   easily distracted in gym   Memory Decreased short-term memory    Safety/Judgement Decreased awareness of safety;Decreased awareness of deficits    Awareness --   borderline intellectual"they say I had a stroke"   Awareness Comments I"I don't know why I'm here"    Problem Solving Slow processing;Difficulty sequencing    Attention Sustained    Memory Impaired    Awareness Impaired    Executive Function Reasoning;Sequencing    Behaviors Poor frustration tolerance   Flat affect   Cognition Comments unable to complete trail making "B" does not recall any words following short delay, not oriented to day or date, unable to count backwards by 7, unable to correctly complete clock drawing task     Sensation   Light Touch Not tested      Coordination   Fine Motor Movements are Fluid and Coordinated Not tested   appears intact     ROM / Strength   AROM / PROM  / Strength AROM      AROM   Overall AROM  Within functional limits for tasks performed      Hand Function   Right Hand Grip (lbs) 48.2    Left Hand Grip (lbs) 42.3                                OT Short Term Goals - 10/13/21 1511       OT SHORT TERM GOAL #1   Title Pt/ family will verbalize understanding of memory compensation strategies    Time 4    Status New    Target Date 11/10/21      OT SHORT TERM GOAL #2   Title I with cognitive HEP    Time 4    Period Weeks    Status New      OT SHORT TERM GOAL #3   Title Pt will demonstrate ability to maintain selective  attention  to functional task in mod distracting environment with 80% or better accuracy.    Baseline internally and externally distracted in gym    Time 4    Period Weeks    Status New      OT SHORT TERM GOAL #4   Title Pt will perform basic change making task / basic financial management with 80% or better accuarcy.    Time 4    Period Weeks    Status New      OT SHORT TERM GOAL #5   Title Pt will be oriented to day, date and situation 3/4 trials    Time 4    Period Weeks    Status New      Additional Short Term Goals   Additional Short Term Goals Yes      OT SHORT TERM GOAL #6   Title assess environmental scanning and set goal prn    Time 4    Period Weeks    Status New      OT SHORT TERM GOAL #7   Title Pt will demonstrate ability to naviagate from gym to waiting area without verbal cues.    Time 4    Period Weeks    Status New      OT SHORT TERM GOAL #8   Title Pt will demonstrate ability to sequence a functional or ADL  task with 90% or better accuracy.    Time 4    Period Weeks    Status New               OT Long Term Goals - 10/13/21 1514       OT LONG TERM GOAL #1   Title Pt will independently utilize compensatory strategies for memory and cognitive deficits.    Time 12    Period Weeks    Status New    Target Date 01/05/22      OT LONG TERM  GOAL #2   Title Pt will perform simple home mnagement/ microwave cooking modified Indpendently    Time 12    Period Weeks    Status New      OT LONG TERM GOAL #3   Title Pt will demonstrate ability to alternate attention with 80% or better accuracy in prep for work and driving    Time 12    Period Weeks    Status New      OT LONG TERM GOAL #4   Title Pt will perform mod complex financial management task with 90% or better accuracy.    Time 12    Period Weeks    Status New      OT LONG TERM GOAL #5   Title Pt/ family will verbalize understanding of return to work/ driving recommendations.    Time 12    Period Weeks    Status New      OT LONG TERM GOAL #6   Title Further assess cognition in functional context and set goal prn    Time 12    Period Weeks    Status New                   Plan - 10/13/21 1506     Clinical Impression Statement 70 y.o. female presented to [facility] hospital on 10/09/2021 with persistent confusion and tingling in finger tips. 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 PMH includes HTN, acute metabolic encephalopathy, Pt presents to occupational therapy with the following deficits:cognitve deficits including decreased orientation, impaired short term memory, decreased awareness and decreased attention.  Pt can benefit from skilled occupational therapy to address these deficits in order to maximize pt's safety and I with daily activities.Pt lives with her husband and works as a Scientist, water quality at [employer]. she was completely i prior to CVA. Pt is accompanied by her dtr today.    OT Occupational Profile and History Detailed Assessment- Review of Records and additional review of physical, cognitive, psychosocial history related to current functional performance    Occupational performance deficits (Please refer to evaluation for details): ADL's;IADL's;Work;Leisure;Social Participation    Cognitive Skills  Attention;Energy/Drive;Memory;Orientation;Perception;Problem Solve;Safety Awareness;Sequencing;Thought;Temperament/Personality;Understand    Rehab Potential Good    Clinical Decision Making Limited treatment options, no task modification necessary    Comorbidities Affecting Occupational Performance: May have comorbidities impacting occupational performance    Modification or Assistance to Complete Evaluation  Min-Moderate modification of tasks or assist with assess necessary to complete eval    OT Frequency 2x / week   plus eval anticipate d/c after 8 weeks dependent upon progress.   OT Duration 12 weeks    OT Treatment/Interventions Self-care/ADL training;Therapeutic exercise;Neuromuscular education;Energy conservation;Therapeutic activities;Cognitive remediation/compensation;DME and/or AE instruction;Cryotherapy;Paraffin;Visual/perceptual remediation/compensation;Patient/family education;Moist Heat    Plan memory/ cognitive  compensation strategies, assess environmental scanning and set goal prn    Consulted and Agree with Plan of  Care Patient;Family member/caregiver             Patient will benefit from skilled therapeutic intervention in order to improve the following deficits and impairments:     Cognitive Skills: Attention, Energy/Drive, Memory, Orientation, Perception, Problem Solve, Safety Awareness, Sequencing, Thought, Temperament/Personality, Understand     Visit Diagnosis: Attention and concentration deficit - Plan: Ot plan of care cert/re-cert  Frontal lobe and executive function deficit - Plan: Ot plan of care cert/re-cert    Problem List Patient Active Problem List   Diagnosis Date Noted   Cerebral thrombosis with cerebral infarction 10/10/2021   HTN (hypertension) 54/49/2010   Acute metabolic encephalopathy 05/04/1974   HTN (hypertension), malignant 10/09/2021   Essential hypertension 08/17/2018    Jaquasia Doscher, OT 10/13/2021, 3:38 PM  [facility] [address] [place], [place], [address] Phone: [phone]   Fax:  [phone]  Name: Ameka Krigbaum MRN: [id number] Date of Birth: 03/03/1951

## 2021-10-14 ENCOUNTER — Telehealth: Payer: Self-pay | Admitting: *Deleted

## 2021-10-14 ENCOUNTER — Ambulatory Visit: Payer: Medicare Other | Admitting: Occupational Therapy

## 2021-10-14 NOTE — Telephone Encounter (Signed)
Please call Neleh Muldoon in the monitor department at [phone]. (Daughter had called to inquire if patients monitor had been ordered.  We do not have a DPR on record to provide her with that information.)

## 2021-10-14 NOTE — Telephone Encounter (Signed)
Follow Up:     Daughter would like to know if patient's monitor have been ordered?

## 2021-10-15 NOTE — Telephone Encounter (Signed)
Patient's daughter is returning call. She is requesting that the call be returned to her instead of the patient due to recent stroke. I informed her of DPR policy and she states she can add the patient to the phone call for verbal consent. Again, she states she would like to confirm whether the monitor has been ordered. Please advise.

## 2021-10-15 NOTE — Telephone Encounter (Signed)
Darlene [name] was put on line to give permission to speak with her daughter regarding cardiac event monitor. [employer] would have called to confirm a shipping address before sending monitor via [employer].  [employer] had called but daughter thought it was a Freight forwarder. As of today [employer] has created a UPS shipping label tracking # [id number].  UPS should pick the package up at their facility today.  Once the package has been picked up, they will provide an estimated delivery date/ time, which, is usually within 3 days. Daughter provided [employer] telephone # [phone] to call and confirm shipping address and the [employer] tracking number.

## 2021-10-19 ENCOUNTER — Ambulatory Visit (INDEPENDENT_AMBULATORY_CARE_PROVIDER_SITE_OTHER): Payer: Medicare Other

## 2021-10-19 DIAGNOSIS — I4891 Unspecified atrial fibrillation: Secondary | ICD-10-CM

## 2021-10-20 DIAGNOSIS — I4891 Unspecified atrial fibrillation: Secondary | ICD-10-CM | POA: Diagnosis not present

## 2021-10-21 ENCOUNTER — Ambulatory Visit: Payer: Medicare Other | Admitting: Occupational Therapy

## 2021-10-21 ENCOUNTER — Other Ambulatory Visit: Payer: Self-pay

## 2021-10-21 DIAGNOSIS — R41840 Attention and concentration deficit: Secondary | ICD-10-CM

## 2021-10-21 DIAGNOSIS — R41844 Frontal lobe and executive function deficit: Secondary | ICD-10-CM | POA: Diagnosis not present

## 2021-10-21 NOTE — Patient Instructions (Addendum)
Memory Compensation Strategies  Use WARM strategy. W= write it down A=  associate it R=  repeat it M=  make a mental picture  You can keep a Memory Notebook. Use a 3-ring notebook with sections for the following:  calendar, important names and phone numbers, medications, doctors names/phone numbers, to do list/reminders, and a section to journal what you did each day  Use a calendar to write appointments down.  Write yourself a schedule for the day.  This can be placed on the calendar or in a separate section of the Memory Notebook.  Keeping a regular schedule can help memory.  Use medication organizer with sections for each day or morning/evening pills  You may need help loading it  Keep a basket, or pegboard by the door.   Place items that you need to take out with you in the basket or on the pegboard.  You may also want to include a message board for reminders.  Use sticky notes. Place sticky notes with reminders in a place where the task is performed.  For example:  turn off the stove placed by the stove, lock the door placed on the door at eye level, take your medications on the bathroom mirror or by the place where you normally take your medications  Use alarms, timers, and/or a reminder app. Use while cooking to remind yourself to check on food or as a reminder to take your medicine, or as a reminder to make a call, or as a reminder to perform another task, etc.  Use a voice recorder app or small tape recorder to record important information and notes for yourself. Go back at the end of the day and listen to these.      Keeping Thinking Skills Sharp: 1. Jigsaw puzzles 2. Card/board games 3. Talking on the phone/social events 4. Lumosity.com or constant therapy 5. Online games 6. Word searches/crossword puzzles 7.  Logic puzzles 8. Aerobic exercise (stationary bike) 9. Eating balanced diet (fruits & veggies) 10. Drink water 11. Try something new--new  recipe, hobby 12. Crafts 13. Do a variety of activities that are challenging

## 2021-10-21 NOTE — Therapy (Signed)
[facility] [address] [place], [place], [address] Phone: [phone]   Fax:  [phone]  Occupational Therapy Treatment  Patient Details  Name: Darlene [name] MRN: [id number] Date of Birth: 01/23/1951 Referring Provider (OT): Dr. Leonie Man   Encounter Date: 10/21/2021   OT End of Session - 10/21/21 1311     Visit Number 2    Number of Visits 25    Date for OT Re-Evaluation 01/05/22    Authorization Type Medicare    Authorization - Visit Number 2    Progress Note Due on Visit 10    OT Start Time 1104    OT Stop Time 1144    OT Time Calculation (min) 40 min    Activity Tolerance Patient limited by lethargy    Behavior During Therapy Flat affect             Past Medical History:  Diagnosis Date   Hypertension     No past surgical history on file.  There were no vitals filed for this visit.   Subjective Assessment - 10/21/21 1308     Subjective  Pt reports she can't remember anything    Patient Stated Goals Pt's dtr states memory issues,    Currently in Pain? No/denies                    Treatment: completing a 12 piece puzzle in a quiet room, mod-max v.c due to decreased attention, increased time required. Pt's son was made aware that she will need cueing and assistance at home. Environmental scanning 12/15 items located on first pass, pt locate remaining items on second pass, with min v.c for scanning. Pt does not appear to be scanning side to side with ambulation Pt was not able to path find to return to the kitchen.              OT Education - 10/21/21 1256     Education Details Memory compensation strategies,keeping thinking skills sharp, using calendar to mark off days so that pt is oriented to date,    Person(s) Educated Patient;Child(ren)   son   Methods Explanation;Verbal cues;Handout    Comprehension Verbalized understanding              OT Short Term Goals -  10/13/21 1511       OT SHORT TERM GOAL #1   Title Pt/ family will verbalize understanding of memory compensation strategies    Time 4    Status New    Target Date 11/10/21      OT SHORT TERM GOAL #2   Title I with cognitive HEP    Time 4    Period Weeks    Status New      OT SHORT TERM GOAL #3   Title Pt will demonstrate ability to maintain selective  attention  to functional task in mod distracting environment with 80% or better accuracy.    Baseline internally and externally distracted in gym    Time 4    Period Weeks    Status New      OT SHORT TERM GOAL #4   Title Pt will perform basic change making task / basic financial management with 80% or better accuarcy.    Time 4    Period Weeks    Status New      OT SHORT TERM GOAL #5   Title Pt will be oriented to day, date and situation 3/4 trials  Time 4    Period Weeks    Status New      Additional Short Term Goals   Additional Short Term Goals Yes      OT SHORT TERM GOAL #6   Title assess environmental scanning and set goal prn    Time 4    Period Weeks    Status New      OT SHORT TERM GOAL #7   Title Pt will demonstrate ability to naviagate from gym to waiting area without verbal cues.    Time 4    Period Weeks    Status New      OT SHORT TERM GOAL #8   Title Pt will demonstrate ability to sequence a functional or ADL  task with 90% or better accuracy.    Time 4    Period Weeks    Status New               OT Long Term Goals - 10/13/21 1514       OT LONG TERM GOAL #1   Title Pt will independently utilize compensatory strategies for memory and cognitive deficits.    Time 12    Period Weeks    Status New    Target Date 01/05/22      OT LONG TERM GOAL #2   Title Pt will perform simple home mnagement/ microwave cooking modified Indpendently    Time 12    Period Weeks    Status New      OT LONG TERM GOAL #3   Title Pt will demonstrate ability to alternate attention with 80% or better  accuracy in prep for work and driving    Time 12    Period Weeks    Status New      OT LONG TERM GOAL #4   Title Pt will perform mod complex financial management task with 90% or better accuracy.    Time 12    Period Weeks    Status New      OT LONG TERM GOAL #5   Title Pt/ family will verbalize understanding of return to work/ driving recommendations.    Time 12    Period Weeks    Status New      OT LONG TERM GOAL #6   Title Further assess cognition in functional context and set goal prn    Time 12    Period Weeks    Status New                   Plan - 10/21/21 1323     Clinical Impression Statement Pt/ son were educated in memory compensations. Pt demonstrates significant cognitive deficits and therapist has recommended that pt does no cook without direct assistance.    OT Occupational Profile and History Detailed Assessment- Review of Records and additional review of physical, cognitive, psychosocial history related to current functional performance    Occupational performance deficits (Please refer to evaluation for details): ADL's;IADL's;Work;Leisure;Social Participation    Cognitive Skills Attention;Energy/Drive;Memory;Orientation;Perception;Problem Solve;Safety Awareness;Sequencing;Thought;Temperament/Personality;Understand    Rehab Potential Good    Clinical Decision Making Limited treatment options, no task modification necessary    Comorbidities Affecting Occupational Performance: May have comorbidities impacting occupational performance    Modification or Assistance to Complete Evaluation  Min-Moderate modification of tasks or assist with assess necessary to complete eval    OT Frequency 2x / week   plus eval anticipate d/c after 8 weeks dependent upon progress.   OT Duration 12  weeks    OT Treatment/Interventions Self-care/ADL training;Therapeutic exercise;Neuromuscular education;Energy conservation;Therapeutic activities;Cognitive  remediation/compensation;DME and/or AE instruction;Cryotherapy;Paraffin;Visual/perceptual remediation/compensation;Patient/family education;Moist Heat    Plan simple functional tasks for attention/ memory, card matching or sorting? peg design?continue memory cognitive compensations and use of calandar    Consulted and Agree with Plan of Care Patient;Family member/caregiver             Patient will benefit from skilled therapeutic intervention in order to improve the following deficits and impairments:     Cognitive Skills: Attention, Energy/Drive, Memory, Orientation, Perception, Problem Solve, Safety Awareness, Sequencing, Thought, Temperament/Personality, Understand     Visit Diagnosis: Attention and concentration deficit  Frontal lobe and executive function deficit    Problem List Patient Active Problem List   Diagnosis Date Noted   Cerebral thrombosis with cerebral infarction 10/10/2021   HTN (hypertension) 35/70/1779   Acute metabolic encephalopathy 39/12/90   HTN (hypertension), malignant 10/09/2021   Essential hypertension 08/17/2018    Gerrianne Aydelott, OT 10/21/2021, 1:26 PM Theone Murdoch, OTR/L [phone] Phone: [phone] 1:27 PM 10/21/21  [facility] [address] [place], [place], [address] Phone: [phone]   Fax:  [phone]  Name: Darlene [name] MRN: [id number] Date of Birth: 23-Oct-1951

## 2021-10-26 ENCOUNTER — Other Ambulatory Visit: Payer: Self-pay

## 2021-10-26 ENCOUNTER — Encounter: Payer: Self-pay | Admitting: Occupational Therapy

## 2021-10-26 ENCOUNTER — Ambulatory Visit: Payer: Medicare Other | Attending: Internal Medicine | Admitting: Occupational Therapy

## 2021-10-26 DIAGNOSIS — R41844 Frontal lobe and executive function deficit: Secondary | ICD-10-CM | POA: Insufficient documentation

## 2021-10-26 DIAGNOSIS — R41840 Attention and concentration deficit: Secondary | ICD-10-CM | POA: Insufficient documentation

## 2021-10-26 NOTE — Therapy (Signed)
[facility] [address] [place], [place], [address] Phone: [phone]   Fax:  [phone]  Occupational Therapy Treatment  Patient Details  Name: Darlene [name] MRN: [id number] Date of Birth: 12-Sep-1951 Referring Provider (OT): Dr. Leonie Man   Encounter Date: 10/26/2021   OT End of Session - 10/26/21 0859     Visit Number 3    Number of Visits 25    Date for OT Re-Evaluation 01/05/22    Authorization Type Medicare    Authorization - Visit Number 3    Authorization - Number of Visits 10    Progress Note Due on Visit 10    OT Start Time [phone]    OT Stop Time 0930    OT Time Calculation (min) 38 min    Activity Tolerance Patient limited by lethargy    Behavior During Therapy Flat affect             Past Medical History:  Diagnosis Date   Hypertension     History reviewed. No pertinent surgical history.  There were no vitals filed for this visit.   Subjective Assessment - 10/26/21 0853     Subjective  caregiver reports that they are using a calendar at home    Patient Stated Goals Pt's dtr states memory issues,    Currently in Pain? No/denies              All in quiet environment:  Tabletop visual scanning to match cards by number with min cueing for face cards and 1 error/wrong card.  Copying small peg design with min cueing x1 and incr time for problem-solving and organization.    Money flashcards to determine amount (change only) with approx 77% accuracy, able to self-correct with min cues.    Simple change making sheet (all out of $2.00 paid) with 6/10 correct with some self-corrected errors, pt able to correct errors with min cueing and needed cues for decimal x2.    Pt oriented to date, day, month with min cueing for date only, then located date on calendar with mod cueing and incr time (initially was looking at December instead of January initially and wrong day of the week).       OT  Education - 10/26/21 1009     Education Details Recommended use of calendar that includes weekends (using therapy calendar currently) and crossing off days to help with orientation.  Reviewed some appropriate activities for home to encourage cognition (board/card games, word games, talking on phone, helping cook familiar items with supervision, reading short paragraph and having someone ask questions, memorize Bible verse and recall multiple times throughout the day).  Instructed son call prescribing MD office with questions regarding heart monitor.    Person(s) Educated Patient;Child(ren)    Methods Explanation    Comprehension Verbalized understanding              OT Short Term Goals - 10/13/21 1511       OT SHORT TERM GOAL #1   Title Pt/ family will verbalize understanding of memory compensation strategies    Time 4    Status New    Target Date 11/10/21      OT SHORT TERM GOAL #2   Title I with cognitive HEP    Time 4    Period Weeks    Status New      OT SHORT TERM GOAL #3   Title Pt will demonstrate ability to maintain selective  attention  to  functional task in mod distracting environment with 80% or better accuracy.    Baseline internally and externally distracted in gym    Time 4    Period Weeks    Status New      OT SHORT TERM GOAL #4   Title Pt will perform basic change making task / basic financial management with 80% or better accuarcy.    Time 4    Period Weeks    Status New      OT SHORT TERM GOAL #5   Title Pt will be oriented to day, date and situation 3/4 trials    Time 4    Period Weeks    Status New      Additional Short Term Goals   Additional Short Term Goals Yes      OT SHORT TERM GOAL #6   Title assess environmental scanning and set goal prn    Time 4    Period Weeks    Status New      OT SHORT TERM GOAL #7   Title Pt will demonstrate ability to naviagate from gym to waiting area without verbal cues.    Time 4    Period Weeks     Status New      OT SHORT TERM GOAL #8   Title Pt will demonstrate ability to sequence a functional or ADL  task with 90% or better accuracy.    Time 4    Period Weeks    Status New               OT Long Term Goals - 10/13/21 1514       OT LONG TERM GOAL #1   Title Pt will independently utilize compensatory strategies for memory and cognitive deficits.    Time 12    Period Weeks    Status New    Target Date 01/05/22      OT LONG TERM GOAL #2   Title Pt will perform simple home mnagement/ microwave cooking modified Indpendently    Time 12    Period Weeks    Status New      OT LONG TERM GOAL #3   Title Pt will demonstrate ability to alternate attention with 80% or better accuracy in prep for work and driving    Time 12    Period Weeks    Status New      OT LONG TERM GOAL #4   Title Pt will perform mod complex financial management task with 90% or better accuracy.    Time 12    Period Weeks    Status New      OT LONG TERM GOAL #5   Title Pt/ family will verbalize understanding of return to work/ driving recommendations.    Time 12    Period Weeks    Status New      OT LONG TERM GOAL #6   Title Further assess cognition in functional context and set goal prn    Time 12    Period Weeks    Status New                   Plan - 10/26/21 0859     Clinical Impression Statement Pt progressing towards goals and is strarting to use calendar, but recommended use of calendar that includes weekends (using therapy calendar currently) and crossing off days.  Pt oriented to date, day, month with min cueing for date.    OT Occupational Profile  and History Detailed Assessment- Review of Records and additional review of physical, cognitive, psychosocial history related to current functional performance    Occupational performance deficits (Please refer to evaluation for details): ADL's;IADL's;Work;Leisure;Social Participation    Cognitive Skills  Attention;Energy/Drive;Memory;Orientation;Perception;Problem Solve;Safety Awareness;Sequencing;Thought;Temperament/Personality;Understand    Rehab Potential Good    Clinical Decision Making Limited treatment options, no task modification necessary    Comorbidities Affecting Occupational Performance: May have comorbidities impacting occupational performance    Modification or Assistance to Complete Evaluation  Min-Moderate modification of tasks or assist with assess necessary to complete eval    OT Frequency 2x / week   plus eval anticipate d/c after 8 weeks dependent upon progress.   OT Duration 12 weeks    OT Treatment/Interventions Self-care/ADL training;Therapeutic exercise;Neuromuscular education;Energy conservation;Therapeutic activities;Cognitive remediation/compensation;DME and/or AE instruction;Cryotherapy;Paraffin;Visual/perceptual remediation/compensation;Patient/family education;Moist Heat    Plan simple functional tasks for attention/memory, continue memory cognitive compensations and use of calandar    Consulted and Agree with Plan of Care Patient;Family member/caregiver             Patient will benefit from skilled therapeutic intervention in order to improve the following deficits and impairments:     Cognitive Skills: Attention, Energy/Drive, Memory, Orientation, Perception, Problem Solve, Safety Awareness, Sequencing, Thought, Temperament/Personality, Understand     Visit Diagnosis: Attention and concentration deficit  Frontal lobe and executive function deficit    Problem List Patient Active Problem List   Diagnosis Date Noted   Cerebral thrombosis with cerebral infarction 10/10/2021   HTN (hypertension) 94/49/6759   Acute metabolic encephalopathy 16/38/4665   HTN (hypertension), malignant 10/09/2021   Essential hypertension 08/17/2018    Vianne Bulls, OT 10/26/2021, 10:37 AM  [facility] [address] [place], [place], [address] Phone: [phone]   Fax:  [phone]  Name: Latavia Goga MRN: [id number] Date of Birth: 08-Dec-1950  Vianne Bulls, OTR/L [facility] [address] [place], [place]  [address] [phone] phone [phone] 10/26/21 10:37 AM

## 2021-10-27 ENCOUNTER — Encounter: Payer: Self-pay | Admitting: Occupational Therapy

## 2021-10-27 ENCOUNTER — Ambulatory Visit: Payer: Medicare Other | Admitting: Internal Medicine

## 2021-10-27 ENCOUNTER — Ambulatory Visit: Payer: Medicare Other | Admitting: Occupational Therapy

## 2021-10-27 DIAGNOSIS — R41844 Frontal lobe and executive function deficit: Secondary | ICD-10-CM | POA: Diagnosis not present

## 2021-10-27 DIAGNOSIS — R41840 Attention and concentration deficit: Secondary | ICD-10-CM

## 2021-10-27 NOTE — Therapy (Signed)
[facility] [address] [place], [place], [address] Phone: [phone]   Fax:  [phone]  Occupational Therapy Treatment  Patient Details  Name: Darlene [name] MRN: [id number] Date of Birth: Dec 01, 1950 Referring Provider (OT): Dr. Leonie Man   Encounter Date: 10/27/2021   OT End of Session - 10/27/21 0849     Visit Number 4    Number of Visits 25    Date for OT Re-Evaluation 01/05/22    Authorization Type Medicare    Authorization - Visit Number 4    Authorization - Number of Visits 10    Progress Note Due on Visit 10    OT Start Time 0849    OT Stop Time 0930    OT Time Calculation (min) 41 min    Activity Tolerance Patient limited by lethargy    Behavior During Therapy Flat affect             Past Medical History:  Diagnosis Date   Hypertension     History reviewed. No pertinent surgical history.  There were no vitals filed for this visit.   Subjective Assessment - 10/27/21 0849     Subjective  denies pain    Patient Stated Goals Pt's dtr states memory issues    Currently in Pain? No/denies             Activities all performed in mod distracting gym environment today:  Matching clock faces with digital times (small group).  Pt needed min prompting to continue activity due to decr attention (external and and lethargy) initially and for initial problem-solving/strategy.  Pt also needed min cueing x2 for determining which hand was smaller/hour hand at times and for determining exact minute initially (vs. Only on the ":05"s).  Pt accuracy improved with repetition an then completed activity without cueing.    Simple planning task to group items from shopping list (cards) by category/location in grocery store.  Pt able to complete with incr time and good overall accuracy.  Pt only needed min cueing to place eggs in category (dairy/refrigerated)    Completing 12-piece puzzle with incr time, but no  cueing needed.    Functional word problems for simple functional problem solving with  4/5 accurate with min cueing to correct error.   Orientation:  pt able to state month, year correctly, unable to state day of week without cueing, use of calendar for orientation to date without additional cueing.     OT Education - 10/27/21 1221     Education Details Recommended pt try 24-piece puzzle at home and word search (as pt did these prior) as well as check calendar everyday--wrote this down as OT homework in "To Do List" and instructed pt to check off once she does them (pt unable to recall "homework after approx 66min delay without cueing).  Also provided printed January calendar that includes weekends.    Person(s) Educated Patient;Child(ren)    Methods Explanation;Handout    Comprehension Verbalized understanding              OT Short Term Goals - 10/13/21 1511       OT SHORT TERM GOAL #1   Title Pt/ family will verbalize understanding of memory compensation strategies    Time 4    Status New    Target Date 11/10/21      OT SHORT TERM GOAL #2   Title I with cognitive HEP    Time 4    Period Weeks  Status New      OT SHORT TERM GOAL #3   Title Pt will demonstrate ability to maintain selective  attention  to functional task in mod distracting environment with 80% or better accuracy.    Baseline internally and externally distracted in gym    Time 4    Period Weeks    Status New      OT SHORT TERM GOAL #4   Title Pt will perform basic change making task / basic financial management with 80% or better accuarcy.    Time 4    Period Weeks    Status New      OT SHORT TERM GOAL #5   Title Pt will be oriented to day, date and situation 3/4 trials    Time 4    Period Weeks    Status New      Additional Short Term Goals   Additional Short Term Goals Yes      OT SHORT TERM GOAL #6   Title assess environmental scanning and set goal prn    Time 4    Period Weeks     Status New      OT SHORT TERM GOAL #7   Title Pt will demonstrate ability to naviagate from gym to waiting area without verbal cues.    Time 4    Period Weeks    Status New      OT SHORT TERM GOAL #8   Title Pt will demonstrate ability to sequence a functional or ADL  task with 90% or better accuracy.    Time 4    Period Weeks    Status New               OT Long Term Goals - 10/13/21 1514       OT LONG TERM GOAL #1   Title Pt will independently utilize compensatory strategies for memory and cognitive deficits.    Time 12    Period Weeks    Status New    Target Date 01/05/22      OT LONG TERM GOAL #2   Title Pt will perform simple home mnagement/ microwave cooking modified Indpendently    Time 12    Period Weeks    Status New      OT LONG TERM GOAL #3   Title Pt will demonstrate ability to alternate attention with 80% or better accuracy in prep for work and driving    Time 12    Period Weeks    Status New      OT LONG TERM GOAL #4   Title Pt will perform mod complex financial management task with 90% or better accuracy.    Time 12    Period Weeks    Status New      OT LONG TERM GOAL #5   Title Pt/ family will verbalize understanding of return to work/ driving recommendations.    Time 12    Period Weeks    Status New      OT LONG TERM GOAL #6   Title Further assess cognition in functional context and set goal prn    Time 12    Period Weeks    Status New                   Plan - 10/27/21 6606     Clinical Impression Statement Pt is progressing with improved attention and problem-solving today.    OT Occupational Profile and History Detailed  Assessment- Review of Records and additional review of physical, cognitive, psychosocial history related to current functional performance    Occupational performance deficits (Please refer to evaluation for details): ADL's;IADL's;Work;Leisure;Social Participation    Cognitive Skills  Attention;Energy/Drive;Memory;Orientation;Perception;Problem Solve;Safety Awareness;Sequencing;Thought;Temperament/Personality;Understand    Rehab Potential Good    Clinical Decision Making Limited treatment options, no task modification necessary    Comorbidities Affecting Occupational Performance: May have comorbidities impacting occupational performance    Modification or Assistance to Complete Evaluation  Min-Moderate modification of tasks or assist with assess necessary to complete eval    OT Frequency 2x / week   plus eval anticipate d/c after 8 weeks dependent upon progress.   OT Duration 12 weeks    OT Treatment/Interventions Self-care/ADL training;Therapeutic exercise;Neuromuscular education;Energy conservation;Therapeutic activities;Cognitive remediation/compensation;DME and/or AE instruction;Cryotherapy;Paraffin;Visual/perceptual remediation/compensation;Patient/family education;Moist Heat    Plan simple functional tasks for attention/memory, continue/reinforce memory cognitive compensations--help pt/family set up "sections" and check on memory notebook    Consulted and Agree with Plan of Care Patient;Family member/caregiver             Patient will benefit from skilled therapeutic intervention in order to improve the following deficits and impairments:     Cognitive Skills: Attention, Energy/Drive, Memory, Orientation, Perception, Problem Solve, Safety Awareness, Sequencing, Thought, Temperament/Personality, Understand     Visit Diagnosis: Attention and concentration deficit  Frontal lobe and executive function deficit    Problem List Patient Active Problem List   Diagnosis Date Noted   Cerebral thrombosis with cerebral infarction 10/10/2021   HTN (hypertension) 45/80/9983   Acute metabolic encephalopathy 38/25/0539   HTN (hypertension), malignant 10/09/2021   Essential hypertension 08/17/2018    Vianne Bulls, OT 10/27/2021, 12:24 PM  [facility] [address] [place], [place], [address] Phone: [phone]   Fax:  [phone]  Name: Darlene [name] MRN: [id number] Date of Birth: 1951-07-14  Vianne Bulls, OTR/L [facility] [address] [place], [place]  [address] [phone] phone [phone] 10/27/21 12:24 PM

## 2021-10-28 ENCOUNTER — Encounter: Payer: Medicare Other | Admitting: Occupational Therapy

## 2021-11-02 ENCOUNTER — Ambulatory Visit: Payer: Medicare Other | Admitting: Occupational Therapy

## 2021-11-02 ENCOUNTER — Other Ambulatory Visit: Payer: Self-pay

## 2021-11-02 ENCOUNTER — Inpatient Hospital Stay: Payer: Medicare Other | Admitting: Emergency Medicine

## 2021-11-02 DIAGNOSIS — R41840 Attention and concentration deficit: Secondary | ICD-10-CM

## 2021-11-02 DIAGNOSIS — R41844 Frontal lobe and executive function deficit: Secondary | ICD-10-CM | POA: Diagnosis not present

## 2021-11-02 NOTE — Therapy (Signed)
[facility] [address] [place], [place], [address] Phone: [phone]   Fax:  [phone]  Occupational Therapy Treatment  Patient Details  Name: Darlene [name] MRN: [id number] Date of Birth: 12-Apr-1951 Referring Provider (OT): Dr. Leonie Man   Encounter Date: 11/02/2021   OT End of Session - 11/02/21 1240     Visit Number 5    Number of Visits 25    Date for OT Re-Evaluation 01/05/22    Authorization Type Medicare    Authorization - Visit Number 5    Authorization - Number of Visits 10    Progress Note Due on Visit 10    OT Start Time 0850    OT Stop Time 0930    OT Time Calculation (min) 40 min    Activity Tolerance Patient limited by lethargy    Behavior During Therapy Flat affect             Past Medical History:  Diagnosis Date   Hypertension     No past surgical history on file.  There were no vitals filed for this visit.       Pain: Denies pain Treatment: Adding items to a weekly schedule for increased attention, min-mod v.c to cross off items that she had completed . Pt mistakenly crossed off several items before completing requiring v.c Mod complex puzzle(48 piece) pt completed parts of puzzle with min-,mod v.c and assist Pt demonstrated better selective attention and she stayed focused on task.                   OT Education - 11/02/21 0904     Education Details Used calendar to orient herself, reveiwed homework, pt had not checkde off list but she reported performing, therapist discussed making sure she checks items off after completion    Person(s) Educated Patient;Child(ren)    Methods Explanation;Handout;Demonstration    Comprehension Verbalized understanding              OT Short Term Goals - 10/13/21 1511       OT SHORT TERM GOAL #1   Title Pt/ family will verbalize understanding of memory compensation strategies    Time 4    Status New    Target Date  11/10/21      OT SHORT TERM GOAL #2   Title I with cognitive HEP    Time 4    Period Weeks    Status New      OT SHORT TERM GOAL #3   Title Pt will demonstrate ability to maintain selective  attention  to functional task in mod distracting environment with 80% or better accuracy.    Baseline internally and externally distracted in gym    Time 4    Period Weeks    Status New      OT SHORT TERM GOAL #4   Title Pt will perform basic change making task / basic financial management with 80% or better accuarcy.    Time 4    Period Weeks    Status New      OT SHORT TERM GOAL #5   Title Pt will be oriented to day, date and situation 3/4 trials    Time 4    Period Weeks    Status New      Additional Short Term Goals   Additional Short Term Goals Yes      OT SHORT TERM GOAL #6   Title assess environmental scanning and set goal prn  Time 4    Period Weeks    Status New      OT SHORT TERM GOAL #7   Title Pt will demonstrate ability to naviagate from gym to waiting area without verbal cues.    Time 4    Period Weeks    Status New      OT SHORT TERM GOAL #8   Title Pt will demonstrate ability to sequence a functional or ADL  task with 90% or better accuracy.    Time 4    Period Weeks    Status New               OT Long Term Goals - 10/13/21 1514       OT LONG TERM GOAL #1   Title Pt will independently utilize compensatory strategies for memory and cognitive deficits.    Time 12    Period Weeks    Status New    Target Date 01/05/22      OT LONG TERM GOAL #2   Title Pt will perform simple home mnagement/ microwave cooking modified Indpendently    Time 12    Period Weeks    Status New      OT LONG TERM GOAL #3   Title Pt will demonstrate ability to alternate attention with 80% or better accuracy in prep for work and driving    Time 12    Period Weeks    Status New      OT LONG TERM GOAL #4   Title Pt will perform mod complex financial management task  with 90% or better accuracy.    Time 12    Period Weeks    Status New      OT LONG TERM GOAL #5   Title Pt/ family will verbalize understanding of return to work/ driving recommendations.    Time 12    Period Weeks    Status New      OT LONG TERM GOAL #6   Title Further assess cognition in functional context and set goal prn    Time 12    Period Weeks    Status New                   Plan - 11/02/21 1239     Clinical Impression Statement Pt is progressing towards goals with improving attention today.    OT Occupational Profile and History Detailed Assessment- Review of Records and additional review of physical, cognitive, psychosocial history related to current functional performance    Occupational performance deficits (Please refer to evaluation for details): ADL's;IADL's;Work;Leisure;Social Participation    Cognitive Skills Attention;Energy/Drive;Memory;Orientation;Perception;Problem Solve;Safety Awareness;Sequencing;Thought;Temperament/Personality;Understand    Rehab Potential Good    Clinical Decision Making Limited treatment options, no task modification necessary    Comorbidities Affecting Occupational Performance: May have comorbidities impacting occupational performance    Modification or Assistance to Complete Evaluation  Min-Moderate modification of tasks or assist with assess necessary to complete eval    OT Frequency 2x / week   plus eval anticipate d/c after 8 weeks dependent upon progress.   OT Duration 12 weeks    OT Treatment/Interventions Self-care/ADL training;Therapeutic exercise;Neuromuscular education;Energy conservation;Therapeutic activities;Cognitive remediation/compensation;DME and/or AE instruction;Cryotherapy;Paraffin;Visual/perceptual remediation/compensation;Patient/family education;Moist Heat    Plan simple functional tasks for attention/memory, continue/reinforce memory cognitive compensations    Consulted and Agree with Plan of Care  Patient;Family member/caregiver             Patient will benefit from skilled therapeutic intervention in order  to improve the following deficits and impairments:     Cognitive Skills: Attention, Energy/Drive, Memory, Orientation, Perception, Problem Solve, Safety Awareness, Sequencing, Thought, Temperament/Personality, Understand     Visit Diagnosis: Attention and concentration deficit  Frontal lobe and executive function deficit    Problem List Patient Active Problem List   Diagnosis Date Noted   Cerebral thrombosis with cerebral infarction 10/10/2021   HTN (hypertension) 42/35/3614   Acute metabolic encephalopathy 43/15/4008   HTN (hypertension), malignant 10/09/2021   Essential hypertension 08/17/2018    Ashir Kunz, OT 11/02/2021, 12:41 PM  [facility] [address] [place], [place], [address] Phone: [phone]   Fax:  [phone]  Name: Darlene [name] MRN: [id number] Date of Birth: 1950-12-29

## 2021-11-03 ENCOUNTER — Ambulatory Visit: Payer: Medicare Other | Admitting: Occupational Therapy

## 2021-11-03 DIAGNOSIS — R41844 Frontal lobe and executive function deficit: Secondary | ICD-10-CM

## 2021-11-03 DIAGNOSIS — R41840 Attention and concentration deficit: Secondary | ICD-10-CM | POA: Diagnosis not present

## 2021-11-03 NOTE — Therapy (Signed)
[facility] [address] [place], [place], [address] Phone: [phone]   Fax:  [phone]  Occupational Therapy Treatment  Patient Details  Name: Darlene [name] MRN: [id number] Date of Birth: Jan 03, 1951 Referring Provider (OT): Dr. Leonie Man   Encounter Date: 11/03/2021   OT End of Session - 11/03/21 0916     Visit Number 6    Number of Visits 25    Date for OT Re-Evaluation 01/05/22    Authorization Type Medicare    Authorization - Visit Number 5    Authorization - Number of Visits 10    Progress Note Due on Visit 10    OT Start Time [phone]    OT Stop Time 0930    OT Time Calculation (min) 38 min             Past Medical History:  Diagnosis Date   Hypertension     No past surgical history on file.  There were no vitals filed for this visit.       Pain: Pt denies pain  Treatment: Pipetree design x 2 after pt organized all pieces by size, mod v.c for first design as pt was not attending to pattern or key to perform correctly. Second design min v.c for correct design as pt did not always check key before placing. Activities on I pad, reading a calendar and answering questions 100% accuracy, then counting money level 4 90% Finding matching symbols level 5 for improved selective attention in a mod distracting environment 98%                    OT Short Term Goals - 10/13/21 1511       OT SHORT TERM GOAL #1   Title Pt/ family will verbalize understanding of memory compensation strategies    Time 4    Status New    Target Date 11/10/21      OT SHORT TERM GOAL #2   Title I with cognitive HEP    Time 4    Period Weeks    Status New      OT SHORT TERM GOAL #3   Title Pt will demonstrate ability to maintain selective  attention  to functional task in mod distracting environment with 80% or better accuracy.    Baseline internally and externally distracted in gym    Time 4    Period  Weeks    Status New      OT SHORT TERM GOAL #4   Title Pt will perform basic change making task / basic financial management with 80% or better accuarcy.    Time 4    Period Weeks    Status New      OT SHORT TERM GOAL #5   Title Pt will be oriented to day, date and situation 3/4 trials    Time 4    Period Weeks    Status New      Additional Short Term Goals   Additional Short Term Goals Yes      OT SHORT TERM GOAL #6   Title assess environmental scanning and set goal prn    Time 4    Period Weeks    Status New      OT SHORT TERM GOAL #7   Title Pt will demonstrate ability to naviagate from gym to waiting area without verbal cues.    Time 4    Period Weeks    Status New  OT SHORT TERM GOAL #8   Title Pt will demonstrate ability to sequence a functional or ADL  task with 90% or better accuracy.    Time 4    Period Weeks    Status New               OT Long Term Goals - 10/13/21 1514       OT LONG TERM GOAL #1   Title Pt will independently utilize compensatory strategies for memory and cognitive deficits.    Time 12    Period Weeks    Status New    Target Date 01/05/22      OT LONG TERM GOAL #2   Title Pt will perform simple home mnagement/ microwave cooking modified Indpendently    Time 12    Period Weeks    Status New      OT LONG TERM GOAL #3   Title Pt will demonstrate ability to alternate attention with 80% or better accuracy in prep for work and driving    Time 12    Period Weeks    Status New      OT LONG TERM GOAL #4   Title Pt will perform mod complex financial management task with 90% or better accuracy.    Time 12    Period Weeks    Status New      OT LONG TERM GOAL #5   Title Pt/ family will verbalize understanding of return to work/ driving recommendations.    Time 12    Period Weeks    Status New      OT LONG TERM GOAL #6   Title Further assess cognition in functional context and set goal prn    Time 12    Period Weeks     Status New                    Patient will benefit from skilled therapeutic intervention in order to improve the following deficits and impairments:           Visit Diagnosis: Attention and concentration deficit  Frontal lobe and executive function deficit    Problem List Patient Active Problem List   Diagnosis Date Noted   Cerebral thrombosis with cerebral infarction 10/10/2021   HTN (hypertension) 30/04/6225   Acute metabolic encephalopathy 33/35/4562   HTN (hypertension), malignant 10/09/2021   Essential hypertension 08/17/2018    [name], OT 11/03/2021, 9:17 AM  [facility] [address] [place], [place], [address] Phone: [phone]   Fax:  [phone]  Name: Darlene [name] MRN: [id number] Date of Birth: Jul 10, 1951

## 2021-11-09 ENCOUNTER — Ambulatory Visit: Payer: Medicare Other | Admitting: Occupational Therapy

## 2021-11-10 ENCOUNTER — Ambulatory Visit: Payer: Medicare Other | Admitting: Occupational Therapy

## 2021-11-10 ENCOUNTER — Telehealth: Payer: Self-pay | Admitting: Emergency Medicine

## 2021-11-10 NOTE — Telephone Encounter (Signed)
Type of form received (Home Health, FMLA, disability, handicapped placard, Surgical clearance)  FMLA Form placed in (E-fax folder, Provider mailbox) Provider mailbox  Additional instructions from the patient (mail, fax, notify by phone when complete) Notify by phone when complete  Things to remember: [place] office: If form received in person, remind patient that forms take 7-10 business days CMA should attach charge sheet and put on Supervisor's desk

## 2021-11-11 NOTE — Telephone Encounter (Signed)
Please look into this. Thanks.

## 2021-11-11 NOTE — Telephone Encounter (Signed)
Patient daughter Darlene [name] calling in  Says patient was written out of work by ED for 6 weeks due to a stroke.. front office recevied FMLA forms on behalf of patient & placed in provider box (patient has OV scheduled 01/24) but daughter wants to know if forms can be completed before then bc claim is currently being denied by workplace bc of incomplete forms.. please call [phone]

## 2021-11-12 DIAGNOSIS — Z0279 Encounter for issue of other medical certificate: Secondary | ICD-10-CM

## 2021-11-12 NOTE — Telephone Encounter (Signed)
Completed  FMLA forms, contacted pt daughter.

## 2021-11-16 ENCOUNTER — Other Ambulatory Visit: Payer: Self-pay | Admitting: Emergency Medicine

## 2021-11-16 ENCOUNTER — Ambulatory Visit (INDEPENDENT_AMBULATORY_CARE_PROVIDER_SITE_OTHER): Payer: Medicare Other | Admitting: Emergency Medicine

## 2021-11-16 ENCOUNTER — Encounter: Payer: Medicare Other | Admitting: Occupational Therapy

## 2021-11-16 ENCOUNTER — Other Ambulatory Visit: Payer: Self-pay

## 2021-11-16 ENCOUNTER — Encounter: Payer: Self-pay | Admitting: Emergency Medicine

## 2021-11-16 VITALS — BP 122/78 | HR 60 | Ht 65.0 in | Wt 186.0 lb

## 2021-11-16 DIAGNOSIS — Z09 Encounter for follow-up examination after completed treatment for conditions other than malignant neoplasm: Secondary | ICD-10-CM

## 2021-11-16 DIAGNOSIS — N179 Acute kidney failure, unspecified: Secondary | ICD-10-CM

## 2021-11-16 DIAGNOSIS — N1832 Chronic kidney disease, stage 3b: Secondary | ICD-10-CM | POA: Insufficient documentation

## 2021-11-16 DIAGNOSIS — I10 Essential (primary) hypertension: Secondary | ICD-10-CM

## 2021-11-16 DIAGNOSIS — Z8673 Personal history of transient ischemic attack (TIA), and cerebral infarction without residual deficits: Secondary | ICD-10-CM | POA: Insufficient documentation

## 2021-11-16 LAB — COMPREHENSIVE METABOLIC PANEL
ALT: 26 U/L (ref 0–35)
AST: 25 U/L (ref 0–37)
Albumin: 4.1 g/dL (ref 3.5–5.2)
Alkaline Phosphatase: 66 U/L (ref 39–117)
BUN: 21 mg/dL (ref 6–23)
CO2: 31 mEq/L (ref 19–32)
Calcium: 9.8 mg/dL (ref 8.4–10.5)
Chloride: 103 mEq/L (ref 96–112)
Creatinine, Ser: 1.56 mg/dL — ABNORMAL HIGH (ref 0.40–1.20)
GFR: 33.29 mL/min — ABNORMAL LOW (ref >60.00)
Glucose, Bld: 99 mg/dL (ref 70–99)
Potassium: 3.7 mEq/L (ref 3.5–5.1)
Sodium: 142 mEq/L (ref 135–145)
Total Bilirubin: 1 mg/dL (ref 0.2–1.2)
Total Protein: 7.7 g/dL (ref 6.0–8.3)

## 2021-11-16 LAB — CBC WITH DIFFERENTIAL/PLATELET
Basophils Absolute: 0 10*3/uL (ref 0.0–0.1)
Basophils Relative: 0.6 % (ref 0.0–3.0)
Eosinophils Absolute: 0.1 10*3/uL (ref 0.0–0.7)
Eosinophils Relative: 2.3 % (ref 0.0–5.0)
HCT: 39.1 % (ref 36.0–46.0)
Hemoglobin: 12.9 g/dL (ref 12.0–15.0)
Lymphocytes Relative: 35 % (ref 12.0–46.0)
Lymphs Abs: 2.2 10*3/uL (ref 0.7–4.0)
MCHC: 33.1 g/dL (ref 30.0–36.0)
MCV: 93.5 fl (ref 78.0–100.0)
Monocytes Absolute: 0.4 10*3/uL (ref 0.1–1.0)
Monocytes Relative: 5.8 % (ref 3.0–12.0)
Neutro Abs: 3.5 10*3/uL (ref 1.4–7.7)
Neutrophils Relative %: 56.3 % (ref 43.0–77.0)
Platelets: 186 10*3/uL (ref 150.0–400.0)
RBC: 4.18 Mil/uL (ref 3.87–5.11)
RDW: 13.5 % (ref 11.5–15.5)
WBC: 6.2 10*3/uL (ref 4.0–10.5)

## 2021-11-16 NOTE — Assessment & Plan Note (Signed)
Secondary prevention discussed. Importance of controlling hypertension, cholesterol medication, and daily aspirin discussed. Continue atorvastatin 80 mg daily.

## 2021-11-16 NOTE — Patient Instructions (Signed)

## 2021-11-16 NOTE — Assessment & Plan Note (Signed)
Well-controlled hypertension. Continue Zestoretic 20-12.5 mg daily. Dietary approaches to stop hypertension discussed.

## 2021-11-16 NOTE — Progress Notes (Signed)
Darlene [name] 71 y.o.   Chief Complaint  Patient presents with   Hospitalization Follow-up    Pt had a stroke    HISTORY OF PRESENT ILLNESS: This is a 71 y.o. female here for hospital discharge follow-up. Doing and recovering well.  Here with her son, Antwan today. Has no complaints or medical concerns today. Discharge summary as follows: Physician Discharge Summary  Darlene [name] ASN:[id number] DOB: 1951-03-29 DOA: 10/09/2021   PCP: Horald Pollen, MD   Admit date: 10/09/2021 Discharge date: 10/11/2021   Admitted From: Home Disposition:  Home   Recommendations for Outpatient Follow-up:  Follow up with PCP in 2 weeks Please obtain CMP/CBC in 2 weeks No has been discontinued due to bradycardia, she was instructed to resume lisinopril/hydrochlorothiazide in couple days as allowing for permissive hypertension Patient to follow with EP cardiology, she will have 30 days monitor delivered to her house, she declined loop recorder during hospital stay To follow with neurology Dr. Leonie Man as an outpatient in 8 weeks, she will need MRI brain with IV contrast to evaluate for meningioma as well.   Home Health: Outpatient referral to cognitive OT   Brief/Interim Summary:   Darlene [name] is a 71 y.o. female with history of hypertension presenting with 3 days of confusion, but no difficulty speaking.  Her daughter reports that she was on the phone with the patient about 3 days ago noted that she was not making sense.  She was talking about calling her boss at [employer] [facility] [employer] to let them know she would not be able to come to work, however she has not worked there for years.  Her daughter flew down from [place] to check on her and bring her to the ED.  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  She was admitted for further work-up   Acute CVA -MRI brain significant for right hippocampus stroke.  Carotid Dopplers and 2D echo were unremarkable, neurology input  greatly appreciated, recommendation for loop recorder to monitor for A. fib, she was monitored on telemetry with A. fib during hospital stay, EP cardiology were consulted, patient declined loop recorder insertion during hospital stay, so she will have 30 days cardiac monitor to be arranged by EP, and where they will follow her as an outpatient as well. -She was to continue aspirin and Plavix for stroke prevention for 3 weeks and then aspirin alone -She is to follow with neurology as an outpatient, she will need outpatient repeat MRI with and without contrast to evaluate for possible right frontal meningioma which was incompletely visualized during MRI without contrast during hospital stay, neurology will arrange for that.   Hyperlipidemia -LDL is 155, started on high intensity statin.  Lipitor 80 mg daily   HTN emergency -Blood pressure medications has been held during hospital stay, she was instructed to discontinue lactulose during hospital stay given bradycardia, she is to resume her lisinopril/hydrochlorothiazide and 8 hours as an outpatient to allow for permissive hypertension.   Bradycardia -Hold atenolol, avoid further clonidine.   Acute metabolic encephalopathy -Due to acute CVA.  And Ativan she received for her MRI, mentation back to baseline, but she remains with short-term/long-term memory due to her acute CVA.     Discharge Diagnoses:  Principal Problem:   HTN (hypertension), malignant Active Problems:   Acute metabolic encephalopathy   Cerebral thrombosis with cerebral infarction   HTN (hypertension)   Medication List       STOP taking these medications  atenolol 50 MG tablet Commonly known as: TENORMIN    azithromycin 250 MG tablet Commonly known as: ZITHROMAX    predniSONE 20 MG tablet Commonly known as: DELTASONE    traMADol 50 MG tablet Commonly known as: ULTRAM           TAKE these medications     albuterol 108 (90 Base) MCG/ACT inhaler Commonly  known as: VENTOLIN HFA Inhale 2 puffs into the lungs every 6 (six) hours as needed for wheezing or shortness of breath.    aspirin 81 MG EC tablet Take 1 tablet (81 mg total) by mouth daily. Swallow whole. Start taking on: October 12, 2021    atorvastatin 80 MG tablet Commonly known as: LIPITOR Take 1 tablet (80 mg total) by mouth daily. Start taking on: October 12, 2021    clopidogrel 75 MG tablet Commonly known as: PLAVIX Take 1 tablet (75 mg total) by mouth daily. Take for 20 days then stop. Start taking on: October 12, 2021    cyclobenzaprine 10 MG tablet Commonly known as: FLEXERIL Take 0.5-1 tablets (5-10 mg total) by mouth 3 (three) times daily as needed for muscle spasms.    lisinopril-hydrochlorothiazide 20-12.5 MG tablet Commonly known as: ZESTORETIC Take 2 tablets by mouth daily. Please start on 10/13/2021 Start taking on: October 13, 2021 What changed:  additional instructions These instructions start on October 13, 2021. If you are unsure what to do until then, ask your doctor or other care provider.    pseudoephedrine-guaifenesin 60-600 MG 12 hr tablet Commonly known as: MUCINEX D Take 1 tablet by mouth daily as needed for congestion.             Follow-up Information        HPI   Prior to Admission medications   Medication Sig Start Date End Date Taking? Authorizing Provider  albuterol (VENTOLIN HFA) 108 (90 Base) MCG/ACT inhaler Inhale 2 puffs into the lungs every 6 (six) hours as needed for wheezing or shortness of breath. 01/28/20 10/09/21  Wendall Mola, NP  aspirin EC 81 MG EC tablet Take 1 tablet (81 mg total) by mouth daily. Swallow whole. 10/12/21 01/10/22  Elgergawy, Silver Huguenin, MD  atorvastatin (LIPITOR) 80 MG tablet Take 1 tablet (80 mg total) by mouth daily. 10/12/21   Elgergawy, Silver Huguenin, MD  clopidogrel (PLAVIX) 75 MG tablet Take 1 tablet (75 mg total) by mouth daily. Take for 20 days then stop. 10/12/21   Elgergawy, Silver Huguenin, MD   cyclobenzaprine (FLEXERIL) 10 MG tablet Take 0.5-1 tablets (5-10 mg total) by mouth 3 (three) times daily as needed for muscle spasms. Patient not taking: Reported on 07/24/2019 03/10/17   Tereasa Coop, PA-C  lisinopril-hydrochlorothiazide (ZESTORETIC) 20-12.5 MG tablet Take 2 tablets by mouth daily. Please start on 10/13/2021 10/13/21   Elgergawy, Silver Huguenin, MD  pseudoephedrine-guaifenesin [facility] D) 60-600 MG 12 hr tablet Take 1 tablet by mouth daily as needed for congestion.    [provider]    No Known Allergies  Patient Active Problem List   Diagnosis Date Noted   Cerebral thrombosis with cerebral infarction 10/10/2021   HTN (hypertension) 65/99/3570   Acute metabolic encephalopathy 17/79/3903   HTN (hypertension), malignant 10/09/2021   Essential hypertension 08/17/2018    Past Medical History:  Diagnosis Date   Hypertension     No past surgical history on file.  Social History   Socioeconomic History   Marital status: Married    Spouse name: Not on file  Number of children: Not on file   Years of education: Not on file   Highest education level: Not on file  Occupational History   Not on file  Tobacco Use   Smoking status: Never   Smokeless tobacco: Never  Substance and Sexual Activity   Alcohol use: No   Drug use: No   Sexual activity: Never  Other Topics Concern   Not on file  Social History Narrative   Not on file   Social Determinants of Health   Financial Resource Strain: Not on file  Food Insecurity: Not on file  Transportation Needs: Not on file  Physical Activity: Not on file  Stress: Not on file  Social Connections: Not on file  Intimate Partner Violence: Not on file    No family history on file.   Review of Systems  Constitutional: Negative.  Negative for chills and fever.  HENT: Negative.  Negative for congestion and sore throat.   Respiratory: Negative.  Negative for cough and shortness of breath.   Cardiovascular:  Negative.  Negative for chest pain and palpitations.  Gastrointestinal:  Negative for abdominal pain, diarrhea, nausea and vomiting.  Genitourinary: Negative.  Negative for dysuria and hematuria.  Skin: Negative.  Negative for rash.  Neurological: Negative.  Negative for dizziness and headaches.  All other systems reviewed and are negative.  Today's Vitals   11/16/21 1115  BP: 122/78  Pulse: 60  SpO2: 97%  Weight: 186 lb (84.4 kg)  Height: 5\' 5"  (1.651 m)   Body mass index is 30.95 kg/m.  Physical Exam Vitals reviewed.  Constitutional:      Appearance: Normal appearance.  HENT:     Head: Normocephalic.  Eyes:     Extraocular Movements: Extraocular movements intact.     Conjunctiva/sclera: Conjunctivae normal.     Pupils: Pupils are equal, round, and reactive to light.  Cardiovascular:     Rate and Rhythm: Normal rate and regular rhythm.     Pulses: Normal pulses.     Heart sounds: Normal heart sounds.  Pulmonary:     Effort: Pulmonary effort is normal.     Breath sounds: Normal breath sounds.  Abdominal:     Palpations: Abdomen is soft.     Tenderness: There is no abdominal tenderness.  Musculoskeletal:     Cervical back: Neck supple. No tenderness.     Right lower leg: No edema.     Left lower leg: No edema.  Lymphadenopathy:     Cervical: No cervical adenopathy.  Skin:    General: Skin is warm and dry.     Capillary Refill: Capillary refill takes less than 2 seconds.  Neurological:     General: No focal deficit present.     Mental Status: She is alert and oriented to person, place, and time.     Cranial Nerves: No cranial nerve deficit.     Sensory: No sensory deficit.     Motor: No weakness.     Coordination: Coordination normal.     Gait: Gait normal.  Psychiatric:        Mood and Affect: Mood normal.        Behavior: Behavior normal.     ASSESSMENT & PLAN: A total of 46 minutes was spent with the patient and counseling/coordination of care regarding  preparing for this visit, review of recent hospital discharge summary, review of most recent blood work results, review of most recent diagnostic imaging, review of all medications, secondary prevention of strokes, hypertension  management, need for daily baby aspirin, need for cholesterol medication, need for blood work today, comprehensive history and physical exam, prognosis, documentation and need for follow-up.  Problem List Items Addressed This Visit       Cardiovascular and Mediastinum   Essential hypertension    Well-controlled hypertension. Continue Zestoretic 20-12.5 mg daily. Dietary approaches to stop hypertension discussed.      Relevant Orders   CBC with Differential/Platelet   Comprehensive metabolic panel     Other   History of recent stroke    Secondary prevention discussed. Importance of controlling hypertension, cholesterol medication, and daily aspirin discussed. Continue atorvastatin 80 mg daily.      Other Visit Diagnoses     Hospital discharge follow-up    -  Primary      Patient Instructions  Hypertension, Adult High blood pressure (hypertension) is when the force of blood pumping through the arteries is too strong. The arteries are the blood vessels that carry blood from the heart throughout the body. Hypertension forces the heart to work harder to pump blood and may cause arteries to become narrow or stiff. Untreated or uncontrolled hypertension can cause a heart attack, heart failure, a stroke, kidney disease, and other problems. A blood pressure reading consists of a higher number over a lower number. Ideally, your blood pressure should be below 120/80. The first ("top") number is called the systolic pressure. It is a measure of the pressure in your arteries as your heart beats. The second ("bottom") number is called the diastolic pressure. It is a measure of the pressure in your arteries as the heart relaxes. What are the causes? The exact cause of this  condition is not known. There are some conditions that result in or are related to high blood pressure. What increases the risk? Some risk factors for high blood pressure are under your control. The following factors may make you more likely to develop this condition: Smoking. Having type 2 diabetes mellitus, high cholesterol, or both. Not getting enough exercise or physical activity. Being overweight. Having too much fat, sugar, calories, or salt (sodium) in your diet. Drinking too much alcohol. Some risk factors for high blood pressure may be difficult or impossible to change. Some of these factors include: Having chronic kidney disease. Having a family history of high blood pressure. Age. Risk increases with age. Race. You may be at higher risk if you are African American. Gender. Men are at higher risk than women before age 12. After age 38, women are at higher risk than men. Having obstructive sleep apnea. Stress. What are the signs or symptoms? High blood pressure may not cause symptoms. Very high blood pressure (hypertensive crisis) may cause: Headache. Anxiety. Shortness of breath. Nosebleed. Nausea and vomiting. Vision changes. Severe chest pain. Seizures. How is this diagnosed? This condition is diagnosed by measuring your blood pressure while you are seated, with your arm resting on a flat surface, your legs uncrossed, and your feet flat on the floor. The cuff of the blood pressure monitor will be placed directly against the skin of your upper arm at the level of your heart. It should be measured at least twice using the same arm. Certain conditions can cause a difference in blood pressure between your right and left arms. Certain factors can cause blood pressure readings to be lower or higher than normal for a short period of time: When your blood pressure is higher when you are in a health care provider's office than when  you are at home, this is called white coat  hypertension. Most people with this condition do not need medicines. When your blood pressure is higher at home than when you are in a health care provider's office, this is called masked hypertension. Most people with this condition may need medicines to control blood pressure. If you have a high blood pressure reading during one visit or you have normal blood pressure with other risk factors, you may be asked to: Return on a different day to have your blood pressure checked again. Monitor your blood pressure at home for 1 week or longer. If you are diagnosed with hypertension, you may have other blood or imaging tests to help your health care provider understand your overall risk for other conditions. How is this treated? This condition is treated by making healthy lifestyle changes, such as eating healthy foods, exercising more, and reducing your alcohol intake. Your health care provider may prescribe medicine if lifestyle changes are not enough to get your blood pressure under control, and if: Your systolic blood pressure is above 130. Your diastolic blood pressure is above 80. Your personal target blood pressure may vary depending on your medical conditions, your age, and other factors. Follow these instructions at home: Eating and drinking  Eat a diet that is high in fiber and potassium, and low in sodium, added sugar, and fat. An example eating plan is called the DASH (Dietary Approaches to Stop Hypertension) diet. To eat this way: Eat plenty of fresh fruits and vegetables. Try to fill one half of your plate at each meal with fruits and vegetables. Eat whole grains, such as whole-wheat pasta, brown rice, or whole-grain bread. Fill about one fourth of your plate with whole grains. Eat or drink low-fat dairy products, such as skim milk or low-fat yogurt. Avoid fatty cuts of meat, processed or cured meats, and poultry with skin. Fill about one fourth of your plate with lean proteins, such as  fish, chicken without skin, beans, eggs, or tofu. Avoid pre-made and processed foods. These tend to be higher in sodium, added sugar, and fat. Reduce your daily sodium intake. Most people with hypertension should eat less than 1,500 mg of sodium a day. Do not drink alcohol if: Your health care provider tells you not to drink. You are pregnant, may be pregnant, or are planning to become pregnant. If you drink alcohol: Limit how much you use to: 0-1 drink a day for women. 0-2 drinks a day for men. Be aware of how much alcohol is in your drink. In the [place] one drink equals one 12 oz bottle of beer (355 mL), one 5 oz glass of wine (148 mL), or one 1 oz glass of hard liquor (44 mL). Lifestyle  Work with your health care provider to maintain a healthy body weight or to lose weight. Ask what an ideal weight is for you. Get at least 30 minutes of exercise most days of the week. Activities may include walking, swimming, or biking. Include exercise to strengthen your muscles (resistance exercise), such as Pilates or lifting weights, as part of your weekly exercise routine. Try to do these types of exercises for 30 minutes at least 3 days a week. Do not use any products that contain nicotine or tobacco, such as cigarettes, e-cigarettes, and chewing tobacco. If you need help quitting, ask your health care provider. Monitor your blood pressure at home as told by your health care provider. Keep all follow-up visits as told by your  health care provider. This is important. Medicines Take over-the-counter and prescription medicines only as told by your health care provider. Follow directions carefully. Blood pressure medicines must be taken as prescribed. Do not skip doses of blood pressure medicine. Doing this puts you at risk for problems and can make the medicine less effective. Ask your health care provider about side effects or reactions to medicines that you should watch for. Contact a health care  provider if you: Think you are having a reaction to a medicine you are taking. Have headaches that keep coming back (recurring). Feel dizzy. Have swelling in your ankles. Have trouble with your vision. Get help right away if you: Develop a severe headache or confusion. Have unusual weakness or numbness. Feel faint. Have severe pain in your chest or abdomen. Vomit repeatedly. Have trouble breathing. Summary Hypertension is when the force of blood pumping through your arteries is too strong. If this condition is not controlled, it may put you at risk for serious complications. Your personal target blood pressure may vary depending on your medical conditions, your age, and other factors. For most people, a normal blood pressure is less than 120/80. Hypertension is treated with lifestyle changes, medicines, or a combination of both. Lifestyle changes include losing weight, eating a healthy, low-sodium diet, exercising more, and limiting alcohol. This information is not intended to replace advice given to you by your health care provider. Make sure you discuss any questions you have with your health care provider. Document Revised: 06/20/2018 Document Reviewed: 06/20/2018 Elsevier Patient Education  2022 [place], [place] [facility]

## 2021-11-17 ENCOUNTER — Encounter: Payer: Medicare Other | Admitting: Occupational Therapy

## 2021-11-23 ENCOUNTER — Ambulatory Visit (INDEPENDENT_AMBULATORY_CARE_PROVIDER_SITE_OTHER): Payer: Medicare Other | Admitting: Cardiology

## 2021-11-23 ENCOUNTER — Other Ambulatory Visit: Payer: Self-pay

## 2021-11-23 ENCOUNTER — Encounter: Payer: Medicare Other | Admitting: Occupational Therapy

## 2021-11-23 ENCOUNTER — Encounter: Payer: Self-pay | Admitting: Cardiology

## 2021-11-23 VITALS — BP 130/68 | HR 68 | Ht 65.5 in | Wt 190.0 lb

## 2021-11-23 DIAGNOSIS — I639 Cerebral infarction, unspecified: Secondary | ICD-10-CM | POA: Diagnosis not present

## 2021-11-23 NOTE — Patient Instructions (Addendum)
Medication Instructions:  Your physician recommends that you continue on your current medications as directed. Please refer to the Current Medication list given to you today.  *If you need a refill on your cardiac medications before your next appointment, please call your pharmacy*   Lab Work: None ordered If you have labs (blood work) drawn today and your tests are completely normal, you will receive your results only by: [place] (if you have MyChart) OR A paper copy in the mail If you have any lab test that is abnormal or we need to change your treatment, we will call you to review the results.   Testing/Procedures: None ordered   Follow-Up: At [facility], you and your health needs are our priority.  As part of our continuing mission to provide you with exceptional heart care, we have created designated Provider Care Teams.  These Care Teams include your primary Cardiologist (physician) and Advanced Practice Providers (APPs -  Physician Assistants and Nurse Practitioners) who all work together to provide you with the care you need, when you need it.  Your next appointment:   As needed     The format for your next appointment:   In Person  Provider:   Allegra Lai, MD    Thank you for choosing [facility]!!   Trinidad Curet, RN [phone]   Other Instructions   Implantable Loop Recorder Placement, Care After This sheet gives you information about how to care for yourself after your procedure. Your health care provider may also give you more specific instructions. If you have problems or questions, contact your health care provider. What can I expect after the procedure? After the procedure, it is common to have: Soreness or discomfort near the incision. Some swelling or bruising near the incision.  Follow these instructions at home: Incision care   Leave your outer dressing on for 72 hours.  After 72 hours you can remove your outer dressing and  shower. Leave adhesive strips in place. These skin closures may need to stay in place for 1-2 weeks. If adhesive strip edges start to loosen and curl up, you may trim the loose edges.  You may remove the strips if they have not fallen off after 2 weeks. Check your incision area every day for signs of infection. Check for: Redness, swelling, or pain. Fluid or blood. Warmth. Pus or a bad smell. Do not take baths, swim, or use a hot tub until your incision is completely healed. If your wound site starts to bleed apply pressure.      If you have any questions/concerns please call the device clinic at [phone].

## 2021-11-23 NOTE — Progress Notes (Signed)
Electrophysiology Office Note   Date:  11/23/2021   ID:  Vonya Ohalloran, DOB July 01, 1951, MRN [id number]  PCP:  Horald Pollen, MD  Cardiologist:   Primary Electrophysiologist:  Ysela Hettinger Meredith Leeds, MD    Chief Complaint: Cryptogenic stroke   History of Present Illness: Mozell Ishii is a 71 y.o. female who is being seen today for the evaluation of cryptogenic stroke at the request of Horald Pollen, *. Presenting today for electrophysiology evaluation.  She has a history of hypertension and stroke.  She presented to the hospital December 2022 with altered mental status.  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  She wore a 30-day monitor and had further work-up that showed no evidence.  She presents today for possible Linq monitor implant.  Today, she denies symptoms of palpitations, chest pain, shortness of breath, orthopnea, PND, lower extremity edema, claudication, dizziness, presyncope, syncope, bleeding, or neurologic sequela. The patient is tolerating medications without difficulties.    Past Medical History:  Diagnosis Date   Hypertension    History reviewed. No pertinent surgical history.   Current Outpatient Medications  Medication Sig Dispense Refill   aspirin EC 81 MG EC tablet Take 1 tablet (81 mg total) by mouth daily. Swallow whole. 90 tablet 0   atorvastatin (LIPITOR) 80 MG tablet Take 1 tablet (80 mg total) by mouth daily. 90 tablet 0   clopidogrel (PLAVIX) 75 MG tablet Take 1 tablet (75 mg total) by mouth daily. Take for 20 days then stop. 20 tablet 0   lisinopril-hydrochlorothiazide (ZESTORETIC) 20-12.5 MG tablet Take 2 tablets by mouth daily. Please start on 10/13/2021 180 tablet 0   albuterol (VENTOLIN HFA) 108 (90 Base) MCG/ACT inhaler Inhale 2 puffs into the lungs every 6 (six) hours as needed for wheezing or shortness of breath. 8 g 1   No current facility-administered medications  for this visit.    Allergies:   Patient has no known allergies.   Social History:  The patient  reports that she has never smoked. She has never used smokeless tobacco. She reports that she does not drink alcohol and does not use drugs.   Family History:  The patient's family history includes Cancer in her sister; Diabetes in her mother.    ROS:  Please see the history of present illness.   Otherwise, review of systems is positive for none.   All other systems are reviewed and negative.    PHYSICAL EXAM: VS:  BP 130/68    Pulse 68    Ht 5' 5.5" (1.664 m)    Wt 190 lb (86.2 kg)    SpO2 96%    BMI 31.14 kg/m  , BMI Body mass index is 31.14 kg/m. GEN: Well nourished, well developed, in no acute distress  HEENT: normal  Neck: no JVD, carotid bruits, or masses Cardiac: RRR; no murmurs, rubs, or gallops,no edema  Respiratory:  clear to auscultation bilaterally, normal work of breathing GI: soft, nontender, nondistended, + BS MS: no deformity or atrophy  Skin: warm and dry Neuro:  Strength and sensation are intact Psych: euthymic mood, full affect  EKG:  EKG is not ordered today. Personal review of the ekg ordered 10/09/21 shows ectopic sinus rhythm, rate 53   Recent Labs: 10/09/2021: B Natriuretic Peptide 748.5 11/16/2021: ALT 26; BUN 21; Creatinine, Ser 1.56; Hemoglobin 12.9; Platelets 186.0; Potassium 3.7; Sodium 142    Lipid Panel     Component  Value Date/Time   CHOL 208 (H) 10/09/2021 1827   TRIG 59 10/09/2021 1827   HDL 41 10/09/2021 1827   CHOLHDL 5.1 10/09/2021 1827   VLDL 12 10/09/2021 1827   LDLCALC 155 (H) 10/09/2021 1827     Wt Readings from Last 3 Encounters:  11/23/21 190 lb (86.2 kg)  11/16/21 186 lb (84.4 kg)  10/10/21 196 lb (88.9 kg)      Other studies Reviewed: Additional studies/ records that were reviewed today include: TTE 10/10/21  Review of the above records today demonstrates:   1. Left ventricular ejection fraction, by estimation, is 65 to  70%. Left  ventricular ejection fraction by 3D volume is 65 %. The left ventricle has  normal function. The left ventricle has no regional wall motion  abnormalities. There is severe  hypertrophy of the septum. The rest of the LV segments demonstrate  moderate left ventricular hypertrophy. Left ventricular diastolic  parameters are consistent with Grade I diastolic dysfunction (impaired  relaxation).   2. Right ventricular systolic function is normal. The right ventricular  size is normal. There is mildly elevated pulmonary artery systolic  pressure. The estimated right ventricular systolic pressure is 45.0 mmHg.   3. Left atrial size was severely dilated.   4. The mitral valve is normal in structure. Mild mitral valve  regurgitation.   5. The aortic valve is tricuspid. There is mild calcification of the  aortic valve. There is mild thickening of the aortic valve. Aortic valve  regurgitation is not visualized. Aortic valve sclerosis is present, with  no evidence of aortic valve stenosis.   6. The inferior vena cava is normal in size with <50% respiratory  variability, suggesting right atrial pressure of 8 mmHg.   Cardiac monitor 10/22/2022 personally reviewed No atrial fibrillation noted Less than 1% ventricular ectopy Sinus rhythm and sinus tachycardia noted No other arrhythmias noted   ASSESSMENT AND PLAN:  1..  Cryptogenic stroke: Thus far no cause has been found.  She wore a 30-day monitor that showed no evidence of arrhythmia.  Despite that, she is at risk of atrial fibrillation as a cause of her stroke.  We Jorden Minchey plan for Linq monitor implant.  Risks and benefits were discussed and include bleeding and infection.  She understands these risks and is agreed to the procedure.    Current medicines are reviewed at length with the patient today.   The patient does not have concerns regarding her medicines.  The following changes were made today:  none  Labs/ tests ordered today  include:  No orders of the defined types were placed in this encounter.    Disposition:   FU with Ermelinda Eckert pending link monitor results d  Signed, Chani Ghanem Meredith Leeds, MD  11/23/2021 4:26 PM     [facility] [address] [place] [address] [phone] (office) [phone] (fax)  SURGEON:  Allegra Lai, MD     PREPROCEDURE DIAGNOSIS:  Cryptogenic Stroke    POSTPROCEDURE DIAGNOSIS:  Cryptogenic Stroke     PROCEDURES:   1. Implantable loop recorder implantation    INTRODUCTION:  Whitney Hillegass is a 71 y.o. female with a history of unexplained stroke who presents today for implantable loop implantation.  The patient has had a cryptogenic stroke.  Despite an extensive workup by neurology, no reversible causes have been identified.  she has worn telemetry during which she did not have arrhythmias.  There is significant concern for possible atrial fibrillation as the cause for  the patients stroke.  The patient therefore presents today for implantable loop implantation.     DESCRIPTION OF PROCEDURE:  Informed written consent was obtained, and the patient was brought to the electrophysiology lab in a fasting state.  The patient required no sedation for the procedure today.  Mapping over the patient's chest was performed by the EP lab staff to identify the area where electrograms were most prominent for ILR recording.  This area was found to be the left parasternal region over the 3rd-4th intercostal space. The patients left chest was therefore prepped and draped in the usual sterile fashion by the EP lab staff. The skin overlying the left parasternal region was infiltrated with lidocaine for local analgesia.  A 0.5-cm incision was made over the left parasternal region over the 3rd intercostal space.  A subcutaneous ILR pocket was fashioned using a combination of sharp and blunt dissection.  A Medtronic Reveal Linq model [place] [id number] implantable loop  recorder was then placed into the pocket  R waves were very prominent and measured 0.44mV. EBL<1 ml.  Steri- Strips and a sterile dressing were then applied.  There were no early apparent complications.     CONCLUSIONS:   1. Successful implantation of a Medtronic Reveal LINQ implantable loop recorder for cryptogenic stroke  2. No early apparent complications.

## 2021-11-24 ENCOUNTER — Encounter: Payer: Medicare Other | Admitting: Occupational Therapy

## 2021-11-25 NOTE — Telephone Encounter (Signed)
Patient daughter Virgilio Belling calling in  Wants to know if provider is willing to extend patient LOA from work.. patient says due to her memory issues caused by the stroke she does not feel comfortable going back into work yet  Also wants to know if there is a certain timeframe or limit on patient driving capabilities  Please call [phone]

## 2021-11-26 NOTE — Telephone Encounter (Signed)
Pt daughter was calling to check the status of FMLA extent paperwork  Pt daughter CB [phone]

## 2021-11-30 ENCOUNTER — Encounter: Payer: Medicare Other | Admitting: Occupational Therapy

## 2021-11-30 NOTE — Telephone Encounter (Signed)
Called and spoke with pt daughter about FMLA. Extended FMLA until 12/27/21. Faxed forms and received conformation.

## 2021-12-01 ENCOUNTER — Encounter: Payer: Medicare Other | Admitting: Occupational Therapy

## 2021-12-07 ENCOUNTER — Encounter: Payer: Medicare Other | Admitting: Occupational Therapy

## 2021-12-08 ENCOUNTER — Encounter: Payer: Medicare Other | Admitting: Occupational Therapy

## 2021-12-14 ENCOUNTER — Encounter: Payer: Self-pay | Admitting: Adult Health

## 2021-12-14 ENCOUNTER — Telehealth: Payer: Self-pay | Admitting: Adult Health

## 2021-12-14 ENCOUNTER — Other Ambulatory Visit: Payer: Self-pay

## 2021-12-14 ENCOUNTER — Inpatient Hospital Stay: Payer: Medicare Other | Admitting: Neurology

## 2021-12-14 ENCOUNTER — Ambulatory Visit (INDEPENDENT_AMBULATORY_CARE_PROVIDER_SITE_OTHER): Payer: Medicare Other | Admitting: Adult Health

## 2021-12-14 VITALS — BP 147/74 | HR 56 | Ht 65.5 in | Wt 190.0 lb

## 2021-12-14 DIAGNOSIS — I63431 Cerebral infarction due to embolism of right posterior cerebral artery: Secondary | ICD-10-CM | POA: Diagnosis not present

## 2021-12-14 DIAGNOSIS — D320 Benign neoplasm of cerebral meninges: Secondary | ICD-10-CM

## 2021-12-14 DIAGNOSIS — I639 Cerebral infarction, unspecified: Secondary | ICD-10-CM

## 2021-12-14 DIAGNOSIS — E785 Hyperlipidemia, unspecified: Secondary | ICD-10-CM | POA: Diagnosis not present

## 2021-12-14 DIAGNOSIS — I69319 Unspecified symptoms and signs involving cognitive functions following cerebral infarction: Secondary | ICD-10-CM

## 2021-12-14 NOTE — Progress Notes (Signed)
[facility] [address] [place]. [place] [address] [phone]       HOSPITAL FOLLOW UP NOTE  Darlene [name] Date of Birth:  07-30-1951 Medical Record Number:  [id number]   Reason for Referral:  hospital stroke follow up    SUBJECTIVE:   CHIEF COMPLAINT:  Chief Complaint  Patient presents with   Follow-up    RM 3 with daughter here for hospital follow up. Pt reports memory has not returned to baseline. She also reports the atorvastatin has been causing constipation along with tiredness. She is scheduled to return to work on 12/27/21 feels like she needs more time before going back.     HPI:   [name] is a 71 y.o. female with history of hypertension who presented on 10/09/2021 with 3 days of confusion, but no difficulty speaking.  Personally reviewed hospitalization pertinent progress notes, lab work and imaging.  Evaluated by Dr. Leonie Man.  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  Imaging also showed enhancing lesion anterior right temporal/frontal lobe questionable meningioma and recommended repeat imaging outpatient.  MRA showed tandem stenosis of right PCA.  Carotid Dopplers unremarkable.  EF 65 to 70%.  LDL 155.  A1c 5.7.  Recommended placement of loop recorder to monitor for atrial fibrillation.  Recommended DAPT for 3 weeks and aspirin alone as well as adding atorvastatin 80 mg daily.  HTN stable.  Other stroke risk factors include advanced age, obesity, CAD, migraines, OSA on CPAP and CHF.  Therapy eval's recommended outpatient OT/SLP and discharged home in stable condition.   Today, 12/14/2021, patient being seen for initial hospital follow-up accompanied by her daughter.  Reports continued cognitive impairment more so with short term memory, attention and processing difficulties. Prior to her stroke, she lived independently with her husband and  son, she drove and worked at [employer] as a Scientist, water quality. She is currently on FMLA until 3/6 per PCP.  Daughter has been staying with her since stroke. She lives in [place]. Daughter is concerned about her returning to work too soon.  Was working with OT but decided to stop due to financial reasons.  She was not referred for SLP.  She does do puzzles and word search exercises at home. Does report occasional difficulties swallowing certain foods such as grapes and strawberries but otherwise no difficulty.  Denies any other residual stroke deficits. Denies new stroke/TIA symptoms.  Completed 3 weeks DAPT, remains on aspirin alone as well as atorvastatin.  Patient concerned re: potential side effects of atorvastatin causing constipation and fatigue. Does drink approx 30 oz of sparkling water per day and "some water". Does admit to limited exercise and activity since stroke.  Blood pressure today 147/74. Occasionally monitors at home which has been stable. Cardiac monitor negative for A-fib, had loop recorder placed 1/31 by Dr. Curt Bears.  No further concerns at this time.     PERTINENT IMAGING  Per hospitalization 10/09/2021 Code Stroke -CT head  Asymmetric right hippocampal atrophy  MRI-  acute infarct extending from the anterior to posterior right hippocampus.  Subcentimeter enhancing lesion along the right inferior frontal lobe although evaluation limited by motion artifact MRA stenosis of the right PCA.   Carotid Doppler no significant extracranial stenosis.   2D Echo ejection fraction 65 to 70%.   LDL 155 HgbA1c 5.7   ROS:   14 system review of systems performed and negative with exception of those listed in  HPI  PMH:  Past Medical History:  Diagnosis Date   Hypertension     PSH: No past surgical history on file.  Social History:  Social History   Socioeconomic History   Marital status: Married    Spouse name: Not on file   Number of children: Not on file   Years of education: Not on  file   Highest education level: Not on file  Occupational History   Not on file  Tobacco Use   Smoking status: Never   Smokeless tobacco: Never  Substance and Sexual Activity   Alcohol use: No   Drug use: No   Sexual activity: Never  Other Topics Concern   Not on file  Social History Narrative   Not on file   Social Determinants of Health   Financial Resource Strain: Not on file  Food Insecurity: Not on file  Transportation Needs: Not on file  Physical Activity: Not on file  Stress: Not on file  Social Connections: Not on file  Intimate Partner Violence: Not on file    Family History:  Family History  Problem Relation Age of Onset   Diabetes Mother        48s   Cancer Sister     Medications:   Current Outpatient Medications on File Prior to Visit  Medication Sig Dispense Refill   aspirin EC 81 MG EC tablet Take 1 tablet (81 mg total) by mouth daily. Swallow whole. 90 tablet 0   atorvastatin (LIPITOR) 80 MG tablet Take 1 tablet (80 mg total) by mouth daily. 90 tablet 0   clopidogrel (PLAVIX) 75 MG tablet Take 1 tablet (75 mg total) by mouth daily. Take for 20 days then stop. 20 tablet 0   lisinopril-hydrochlorothiazide (ZESTORETIC) 20-12.5 MG tablet Take 2 tablets by mouth daily. Please start on 10/13/2021 180 tablet 0   albuterol (VENTOLIN HFA) 108 (90 Base) MCG/ACT inhaler Inhale 2 puffs into the lungs every 6 (six) hours as needed for wheezing or shortness of breath. 8 g 1   No current facility-administered medications on file prior to visit.    Allergies:  No Known Allergies    OBJECTIVE:  Physical Exam  Vitals:   12/14/21 1342  BP: (!) 147/74  Pulse: (!) 56  Weight: 190 lb (86.2 kg)  Height: 5' 5.5" (1.664 m)   Body mass index is 31.14 kg/m. No results found.  Post stroke PHQ 2/9 Depression screen PHQ 2/9 11/16/2021  Decreased Interest 0  Down, Depressed, Hopeless 0  PHQ - 2 Score 0     General: well developed, well nourished, very pleasant  elderly African-American female, seated, in no evident distress Head: head normocephalic and atraumatic.   Neck: supple with no carotid or supraclavicular bruits Cardiovascular: regular rate and rhythm, no murmurs Musculoskeletal: no deformity Skin:  no rash/petichiae Vascular:  Normal pulses all extremities   Neurologic Exam Mental Status: Awake and fully alert.  Fluent speech and language.  Oriented to place and time. Recent memory impaired and remote memory intact. Attention span, concentration and fund of knowledge slightly impaired. Does have some difficulty following conversation - will repeat questions which were just discussed. Mood and affect appropriate. Recall: 1/3, with prompt 2/3. 4 legged animal naming 8 in 60 seconds. Serial addition good.  Cranial Nerves: Fundoscopic exam reveals sharp disc margins. Pupils equal, briskly reactive to light. Extraocular movements full without nystagmus. Visual fields full to confrontation. Hearing intact. Facial sensation intact. Face, tongue, palate moves normally and symmetrically.  Motor: Normal bulk and tone. Normal strength in all tested extremity muscles Sensory.: intact to touch , pinprick , position and vibratory sensation.  Coordination: Rapid alternating movements normal in all extremities. Finger-to-nose and heel-to-shin performed accurately bilaterally. Gait and Station: Arises from chair without difficulty. Stance is normal. Gait demonstrates normal stride length and balance without use of AD. Tandem walk and heel toe moderate difficulty.  Reflexes: 1+ and symmetric. Toes downgoing.     NIHSS  0 Modified Rankin  3      ASSESSMENT: Darlene [name] is a 71 y.o. year old female with R PCA infarct (R hippocampus) on 10/09/2021 secondary to unknown source. Vascular risk factors include uncontrolled HLD, HTN, advanced age, obesity, CAD, migraines, OSA on CPAP and CHF.      PLAN:  Right PCA stroke:  Residual deficit: Cognitive  impairment.  Referral placed for neuro rehab SLP. Continue to follow with PCP for FMLA - would recommend allowing at least another 3 months for adequate participation with SLP and further recovery time. 30-day cardiac event monitor negative for A-fib. S/p ILR 11/23/2021 Continue aspirin 81 mg daily  and atorvastatin 80 mg daily for secondary stroke prevention.   Discussed secondary stroke prevention measures and importance of close PCP follow up for aggressive stroke risk factor management. I have gone over the pathophysiology of stroke, warning signs and symptoms, risk factors and their management in some detail with instructions to go to the closest emergency room for symptoms of concern. HTN: BP goal <130/90.  Stable on current regimen per PCP HLD: LDL goal <70. Recent LDL 155.  Continue atorvastatin 80 mg daily - low suspicion c/o fatigue and constipation side effect of medication - more likely from stroke with decreased activity/exercise and limited water intake. Education further discussed. Repeat lipid panel today. 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 Leonie Man PCP: Horald Pollen, MD    I spent 59 minutes of face-to-face and non-face-to-face time with patient and daughter.  This included previsit chart review including review of recent hospitalization, lab review, study review, order entry, electronic health record documentation, patient and daughter education regarding recent stroke including potential etiology, secondary stroke prevention measures and importance of managing stroke risk factors, residual deficits and typical recovery time and answered all other questions to patient and daughters satisfaction  Frann Rider, AGNP-BC  [facility] [address] [place], [place] [address]  Phone [phone] Fax [phone] Note: This document was prepared with digital dictation and possible smart phrase technology. Any transcriptional errors that result from this process are unintentional.

## 2021-12-14 NOTE — Progress Notes (Signed)
I agree with the above plan 

## 2021-12-14 NOTE — Telephone Encounter (Signed)
medicare order sent to GI, npr they will reach out to the patient to schedule.

## 2021-12-14 NOTE — Patient Instructions (Addendum)
Please call neuro rehab to get scheduled with speech therapy  You will be called to schedule an MR of your brain for re-evaluation   Continue aspirin 81 mg daily  and atorvastatin 80mg  daily  for secondary stroke prevention  Your loop recorder will be monitored by cardiology  Continue to follow up with PCP regarding cholesterol and blood pressure management  Maintain strict control of hypertension with blood pressure goal below 130/90 and cholesterol with LDL cholesterol (bad cholesterol) goal below 70 mg/dL.   Signs of a Stroke? Follow the BEFAST method:  Balance Watch for a sudden loss of balance, trouble with coordination or vertigo Eyes Is there a sudden loss of vision in one or both eyes? Or double vision?  Face: Ask the person to smile. Does one side of the face droop or is it numb?  Arms: Ask the person to raise both arms. Does one arm drift downward? Is there weakness or numbness of a leg? Speech: Ask the person to repeat a simple phrase. Does the speech sound slurred/strange? Is the person confused ? Time: If you observe any of these signs, call 911.     Followup in the future with me in 4 months or call earlier if needed       Thank you for coming to see [place] at [facility]. I hope we have been able to provide you high quality care today.  You may receive a patient satisfaction survey over the next few weeks. We would appreciate your feedback and comments so that we may continue to improve ourselves and the health of our patients.

## 2021-12-15 LAB — LIPID PANEL
Chol/HDL Ratio: 3.7 ratio (ref 0.0–4.4)
Cholesterol, Total: 170 mg/dL (ref 100–199)
HDL: 46 mg/dL (ref >39)
LDL Chol Calc (NIH): 108 mg/dL — ABNORMAL HIGH (ref 0–99)
Triglycerides: 88 mg/dL (ref 0–149)
VLDL Cholesterol Cal: 16 mg/dL (ref 5–40)

## 2021-12-15 NOTE — Telephone Encounter (Signed)
Patient daughter Virgilio Belling calling in  Says patient was seen at neurologist yesterday & they advised to contact pcp to see if FMLA could be extended an addt 90 days   Please call w/ update [phone]

## 2021-12-15 NOTE — Telephone Encounter (Signed)
Called and spoke with pt daughter, she request an extension on FMLA for 03/29/22. Faxed updated forms to [place].

## 2021-12-16 ENCOUNTER — Other Ambulatory Visit: Payer: Self-pay

## 2021-12-16 ENCOUNTER — Ambulatory Visit: Payer: Medicare Other | Attending: Internal Medicine | Admitting: Speech Pathology

## 2021-12-16 ENCOUNTER — Encounter: Payer: Self-pay | Admitting: Speech Pathology

## 2021-12-16 DIAGNOSIS — R41841 Cognitive communication deficit: Secondary | ICD-10-CM | POA: Diagnosis not present

## 2021-12-16 DIAGNOSIS — E785 Hyperlipidemia, unspecified: Secondary | ICD-10-CM | POA: Diagnosis not present

## 2021-12-16 DIAGNOSIS — I639 Cerebral infarction, unspecified: Secondary | ICD-10-CM | POA: Diagnosis not present

## 2021-12-16 DIAGNOSIS — I129 Hypertensive chronic kidney disease with stage 1 through stage 4 chronic kidney disease, or unspecified chronic kidney disease: Secondary | ICD-10-CM | POA: Diagnosis not present

## 2021-12-16 DIAGNOSIS — N390 Urinary tract infection, site not specified: Secondary | ICD-10-CM | POA: Diagnosis not present

## 2021-12-16 DIAGNOSIS — R001 Bradycardia, unspecified: Secondary | ICD-10-CM | POA: Diagnosis not present

## 2021-12-16 DIAGNOSIS — N1832 Chronic kidney disease, stage 3b: Secondary | ICD-10-CM | POA: Diagnosis not present

## 2021-12-16 NOTE — Therapy (Signed)
[facility] [address] [place], [place], [address] Phone: [phone]   Fax:  [phone]  Speech Language Pathology Evaluation  Patient Details  Name: Darlene [name] MRN: [id number] Date of Birth: 11-10-1950 Referring Provider (SLP): Frann Rider NP   Encounter Date: 12/16/2021   End of Session - 12/16/21 1602     Visit Number 1    Number of Visits 25    Date for SLP Re-Evaluation 03/11/22    SLP Start Time 74    SLP Stop Time  1350    SLP Time Calculation (min) 50 min    Activity Tolerance Patient tolerated treatment well             Past Medical History:  Diagnosis Date   Hypertension     History reviewed. No pertinent surgical history.  There were no vitals filed for this visit.   Subjective Assessment - 12/16/21 1508     Subjective "She works on her passwords for 4 hours on her phone"    Patient is accompained by: Family member   daughter, Albin Felling   Currently in Pain? No/denies                SLP Evaluation [facility] - 12/16/21 1508       SLP Visit Information   SLP Received On 12/16/21    Referring Provider (SLP) Frann Rider NP    Onset Date 10/09/21    Medical Diagnosis CVA      Subjective   Patient/Family Stated Goal Return to work, cooking and driving      General Information   HPI 71 y.o. female [facility] hospital [facility] 10[facility]2021 with persistent confusion and tingling in finger tips. 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 PMH includes HTN, acute metabolic encephal    Mobility Status walks independently      Balance Screen   Has the patient fallen in the past 6 months No    Has the patient had a decrease in activity level because of a fear of falling?  No    Is the patient reluctant to leave their home because of a fear of falling?  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     Cognition   Overall Cognitive Status Impaired/Different from baseline    Area of Impairment Orientation;Attention;Memory;Safety/judgement;Awareness;Problem solving    Current Attention Level Sustained    Selective Attention Impaired    Selective Attention Impairment Verbal complex;Functional basic    Memory Impaired    Memory Impairment Storage deficit;Decreased recall of new information    Awareness Impaired    Awareness Impairment Emergent impairment;Anticipatory impairment    Problem Solving Impaired    Problem Solving Impairment Verbal complex;Functional basic    Corporate treasurer;Sequencing      Auditory Comprehension   Overall Auditory Comprehension Appears within functional limits for tasks assessed      Verbal Expression   Overall Verbal Expression Appears within functional limits for tasks assessed      Written Expression   Dominant Hand Right    Written Expression Not tested      Oral Motor/Sensory Function   Overall Oral Motor/Sensory Function Appears within functional limits for  tasks assessed      Motor Speech   Overall Motor Speech Appears within functional limits for tasks assessed      Standardized Assessments   Standardized Assessments  Cognitive Linguistic Quick Test      Cognitive Linguistic Quick Test (Ages 18-72)   Attention Moderate    Memory Mild    Executive Function WNL    Language WNL    Visuospatial Skills Mild    Severity Rating Total 16    Composite Severity Rating 13.6                             SLP Education - 12/16/21 1559     Education Details consolidate password books, dad needs to let her do for herself, family to attend ST, remember ST notebook    Person(s) Educated Patient;Child(ren)    Methods Explanation;Verbal cues;Handout    Comprehension  Verbalized understanding;Verbal cues required;Need further instruction              SLP Short Term Goals - 12/16/21 1620       SLP SHORT TERM GOAL #1   Title Pt will use calendar, crossing out, to be oriented & daily and weekly appointments and schedule with occasional min from family over 1 week    Time 4    Period Weeks    Status New      SLP SHORT TERM GOAL #2   Title Pt will use 1 organized password notebook to recall 4 of her most frequent passwords/websites with occasional min A over 3 sessions    Time 4    Period Weeks    Status New      SLP SHORT TERM GOAL #3   Title Pt will use timer to complete organization tasks to work 20-30 minutes with breaks to reduce fatigue and support attention/concenstration on 2 tasks over 1 week    Time 4    Period Weeks    Status New      SLP SHORT TERM GOAL #4   Title Pt will increase household participation in shopping, menu planning and light cooking with supervision from family    Time 4    Period Weeks    Status New              SLP Long Term Goals - 12/16/21 1625       SLP LONG TERM GOAL #1   Title Pt will manage todo lists and schedule using external aids with rare min A over 1 weeks    Time 12    Period Weeks    Status New      SLP LONG TERM GOAL #2   Title Pt/family will verbalize 3 strategies or accomodations for cognitive impairments for successful return to work as able    Time 12    Period Weeks    Status New      SLP LONG TERM GOAL #3   Title Pt will complete counting $ and making change accruately with mild distractions (selective attention) and ID errors 4/5x with usual min A    Time 12    Period Weeks    Status New      SLP LONG TERM GOAL #4   Title Pt will alternate attention between 2 mildly complex cognitive tasks, IDing errors with occasional min A over 2 sessions    Time 12    Period Weeks      SLP LONG TERM  GOAL #5   Title Pt will improve score of Cognition Function Short Form my 2 points     Baseline score of 28    Time 12    Period Weeks    Status New              Plan - 12/16/21 1602     Clinical Impression Statement Darlene [name] is referred by neurology due to persistent cognitive impairments following CVA. Pt know to our clinic from a brief course of OT. She is accompanied by her daughter, Virgilio Belling who lives out of town. The both report difficulty with memory and focus. 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 She hopes to return to this job. As of now, return date is 12/27/21. Today, Alasha presents with moderate cognitive communication impairment. Virgilio Belling and Lotsee report that Elton is not doing as much arond the house as her husband has taken over cooking, some cleaning and is "doing for [employer].Virgilio Belling notes her mom is frequently having to reset passwords on her phone and sometimes hyperfocuses, trying to work on this for 4 hours, resulting in fatigue. Narya endorses difficulty keeping up with appointments. She forgot she had this appointment today, because she had a earilier appointment and thought they were the same appointment. When asked, Reyes said she took 2 meds, her daughter corrected her that she takes 4. She is recalling meds daily per family. Natarsha is also forgetting some conversations and asking repeated questions. On the CLQT, with cues she ID'd error on symbol cancelation, however despite cue, she continues to cross out both the correct and foil symbols.She recalled 9/18 details on story retell. Despite repeated cues and demonstrations, Zaylie repeated used 3 or 5 lines on design generation (4 is target). Attention is affecting her memory, and she had moderate attention and mild memory impairment on the CLQT. Slow processing also noted. Masaye does verbalize some emergent awareness when asked if she had concerns returning to work, she responded "I'm afraid I'll loose my focus and give back too much  money." She and her daughter endorse fatigue post CVA.  Willomdean rated herself a score of 28 on the Cognitive Function Short Form - Very often (2) her thinking is slow; sometimes (3) she has trouble concentrating, trouble planning for and keeping appointments, and having to read something several times to understand. I instructed them that ST will consist of placing supports and strategies for success with IADL at home and family will beed to attend ST to aid in carryover. Both agreed stating her husband or son would attend ST. I recommend skilled ST to maximize congition for return to household tasks, possible return to work in some capactiy and to reduce caregiver burden.    Speech Therapy Frequency 1x /week   ST rec 2x, however pt and family request 1x week   Duration 12 weeks    Treatment/Interventions Environmental controls;Cueing hierarchy;Compensatory strategies;Functional tasks;Cognitive reorganization;Compensatory techniques;Patient/family education;Internal/external aids;SLP instruction and feedback    Potential to Achieve Goals Good             Patient will benefit from skilled therapeutic intervention in order to improve the following deficits and impairments:   Cognitive communication deficit    Problem List Patient Active Problem List   Diagnosis Date Noted   History of recent stroke 11/16/2021   Acute renal failure superimposed on stage 3b chronic kidney disease ([place]) 11/16/2021   Cerebral thrombosis with cerebral infarction 10/10/2021   HTN (  hypertension) 25/63/8937   Acute metabolic encephalopathy 34/28/7681   HTN (hypertension), malignant 10/09/2021   Essential hypertension 08/17/2018    Mihaela Fajardo, Annye Rusk, [place] 12/16/2021, 4:41 PM  [facility] [address] [place], [place], [address] Phone: [phone]   Fax:  [phone]  Name: Charles Niese MRN: [id number] Date of Birth: 08-Apr-1951

## 2021-12-16 NOTE — Patient Instructions (Addendum)
° °  Use 1 book for passwords, use pencil  Downsize and organize notebooks  Family to attend ST for A in carryover of strategies at home  Consolidate calendars -   Let dad know not to do for [facility] when she can do for herself. He can supervise for safety, but should cue her to double check rather than correct her or jump in and fix

## 2021-12-17 ENCOUNTER — Other Ambulatory Visit: Payer: Self-pay | Admitting: Nephrology

## 2021-12-17 ENCOUNTER — Encounter: Payer: Self-pay | Admitting: Occupational Therapy

## 2021-12-17 DIAGNOSIS — I129 Hypertensive chronic kidney disease with stage 1 through stage 4 chronic kidney disease, or unspecified chronic kidney disease: Secondary | ICD-10-CM

## 2021-12-17 DIAGNOSIS — I639 Cerebral infarction, unspecified: Secondary | ICD-10-CM

## 2021-12-17 DIAGNOSIS — N1832 Chronic kidney disease, stage 3b: Secondary | ICD-10-CM

## 2021-12-17 DIAGNOSIS — R001 Bradycardia, unspecified: Secondary | ICD-10-CM

## 2021-12-17 DIAGNOSIS — E785 Hyperlipidemia, unspecified: Secondary | ICD-10-CM

## 2021-12-17 NOTE — Therapy (Signed)
[facility] °[facility] °[address] °[place], [place], [address] °Phone: [phone]   Fax:  [phone] ° °Patient Details  °Name: Darlene [name] °MRN: [id number] °Date of Birth: 07/15/1951 °Referring Provider:  No ref. provider found ° °Encounter Date: 12/17/2021 °OCCUPATIONAL THERAPY DISCHARGE SUMMARY ° °Visits from Start of Care: 6 ° °Current functional level related to goals / functional outcomes: °Pt dis not fully achieve goals as she called and requested d/c.  °  °Remaining deficits: °Cognitive deficits °  °Education / Equipment: °Pt/ family were educated regarding memory compensations and cognitive HEP.Pt/ family ed was not completed as pt did not return.  ° °Patient agrees to discharge. Patient goals were not met. Patient is being discharged due to the patient's request.. ° °  ° °RINE,KATHRYN, OT °12/17/2021, 9:22 AM ° °[facility] °[facility] °[address] °[place], [place], [address] °Phone: [phone]   Fax:  [phone] °

## 2021-12-20 ENCOUNTER — Ambulatory Visit: Payer: Medicare Other | Admitting: Speech Pathology

## 2021-12-20 ENCOUNTER — Encounter: Payer: Self-pay | Admitting: Speech Pathology

## 2021-12-20 ENCOUNTER — Other Ambulatory Visit: Payer: Self-pay

## 2021-12-20 ENCOUNTER — Encounter: Payer: Self-pay | Admitting: Emergency Medicine

## 2021-12-20 DIAGNOSIS — I10 Essential (primary) hypertension: Secondary | ICD-10-CM

## 2021-12-20 DIAGNOSIS — R41841 Cognitive communication deficit: Secondary | ICD-10-CM | POA: Diagnosis not present

## 2021-12-20 NOTE — Patient Instructions (Addendum)
° °  No EXCUSES!!!  This week, calendar is your [facility]   Continue password organization   Try to get into a routine - breakfast, lunch   Come back with a daily schedule  Scrambled eggs  Dinner 1x with supervision   Checkers Chess [employer] [place] saw puzzles Easy cross words Memory match Murphy a new game!  Listen to and discuss Ted Talks or Podcasts Read and discuss short articles of interest to you- Take notes on these if memory is a challenge Discuss social media posts Look and discuss photo albums  The best activities to improve cognition are functional, real life activities that are important to you:  Plan a menu Participate in household chores and decisions (with supervision) Participate in [place] a party, trip or tailgate with all of the details (even if you aren't really going to carry it out) Participate in your hobby as you are able with assistance Manage your texts, emails with supervision if needed. Google search for items (even if you're not really going to buy anything) and compare prices and features Socialize -  however, too many visitors can be overwhelming, so set limits "My doctor said I should only visit (or talk) for 20 minutes" or "I do better when I visit with just 1-2 people at a time for 20 minutes"

## 2021-12-20 NOTE — Therapy (Signed)
[facility] [address] [place], [place], [address] Phone: [phone]   Fax:  [phone]  Speech Language Pathology Treatment  Patient Details  Name: Darlene [name] MRN: [id number] Date of Birth: Apr 15, 1951 Referring Provider (SLP): Frann Rider NP   Encounter Date: 12/20/2021   End of Session - 12/20/21 1333     Visit Number 2    Number of Visits 25    Date for SLP Re-Evaluation 03/11/22    SLP Start Time 0845    SLP Stop Time  0930    SLP Time Calculation (min) 45 min             Past Medical History:  Diagnosis Date   Hypertension     History reviewed. No pertinent surgical history.  There were no vitals filed for this visit.   Subjective Assessment - 12/20/21 0848     Subjective "She's been doing beter"    Patient is accompained by: Family member   daughter, Virgilio Belling   Currently in Pain? No/denies                   ADULT SLP TREATMENT - 12/20/21 0852       General Information   Behavior/Cognition Alert;Cooperative;Pleasant mood      Treatment Provided   Treatment provided Cognitive-Linquistic      Cognitive-Linquistic Treatment   Treatment focused on Cognition;Patient/family/caregiver education    Skilled Treatment Avalon and Virgilio Belling have worked on her passwords and consolidated them into 1 notebook. Marvie has done a few household chores (unload dishwasher, clean kitchen) since eval. Today, we targeted meal plan/ingridient list. With rare min A, Chaneka generated 2 dinner meals and a breakfast meal - she wrote ingridients with rare min A to ID and correct 1/5 spelling errors. Other errors she ID and corrected spontaneously. She plans to cook scrambled eggs for breakfast with supervision in the kitchen this week. She is sleeping a lot duirng the day. Instructed her to not go back to bed after breakfast, but to take short (30 minute) naps. We generated a rough daily schedule  which includes 15 min exercise (she has not been exercising), brain activitiy and some light household chores. Essa required verbal cues to get dressed in day clothes in the am, as she stated she would put on another night gown.      Assessment / Recommendations / Plan   Plan Continue with current plan of care      Progression Toward Goals   Progression toward goals Progressing toward goals              SLP Education - 12/20/21 1326     Education Details calendar, to do list, keep a schedule and limit naps to 30 min twice a day    Person(s) Educated Patient;Child(ren)    Methods Explanation;Verbal cues;Handout    Comprehension Verbalized understanding;Verbal cues required;Need further instruction;Returned demonstration              SLP Short Term Goals - 12/20/21 1332       SLP SHORT TERM GOAL #1   Title Pt will use calendar, crossing out, to be oriented & daily and weekly appointments and schedule with occasional min from family over 1 week    Time 4    Period Weeks    Status On-going      SLP SHORT TERM GOAL #2   Title Pt will use 1 organized password notebook to recall 4 of her most frequent  passwords/websites with occasional min A over 3 sessions    Time 4    Period Weeks    Status On-going      SLP SHORT TERM GOAL #3   Title Pt will use timer to complete organization tasks to work 20-30 minutes with breaks to reduce fatigue and support attention/concenstration on 2 tasks over 1 week    Time 4    Period Weeks    Status On-going      SLP SHORT TERM GOAL #4   Title Pt will increase household participation in shopping, menu planning and light cooking with supervision from family    Time 4    Period Weeks    Status On-going              SLP Long Term Goals - 12/20/21 1332       SLP LONG TERM GOAL #1   Title Pt will manage todo lists and schedule using external aids with rare min A over 1 weeks    Time 12    Period Weeks    Status On-going       SLP LONG TERM GOAL #2   Title Pt/family will verbalize 3 strategies or accomodations for cognitive impairments for successful return to work as able    Time 12    Period Weeks    Status On-going      SLP LONG TERM GOAL #3   Title Pt will complete counting $ and making change accruately with mild distractions (selective attention) and ID errors 4/5x with usual min A    Time 12    Period Weeks    Status On-going      SLP LONG TERM GOAL #4   Title Pt will alternate attention between 2 mildly complex cognitive tasks, IDing errors with occasional min A over 2 sessions    Time 12    Period Weeks    Status On-going      SLP LONG TERM GOAL #5   Title Pt will improve score of Cognition Function Short Form my 2 points    Baseline score of 28    Time 12    Period Weeks    Status On-going              Plan - 12/20/21 1327     Clinical Impression Statement E[place] with moderate cognitive impairments including memory, attention, visuospatial. Targeted use of calendar, keeping a regular schedule and completing household tasks with supervision and compensations for attention and memory. Family has tried to get her to use memory notebook/journal at home, however Valine has not wanted to use this. May address/attempt again in future. She requires cues to emergent awareness and need to engage in ADL/IADL's to improve cognition. Continue skilled ST to maximize cognition for safety , reduce caregiver burden and possibly return to work in some capacity (her goal).    Speech Therapy Frequency 1x /week   rec 2x/week, pt electing to come 1x   Duration 12 weeks    Treatment/Interventions Environmental controls;Cueing hierarchy;Compensatory strategies;Functional tasks;Cognitive reorganization;Compensatory techniques;Patient/family education;Internal/external aids;SLP instruction and feedback    Potential to Achieve Goals Good    Potential Considerations Ability to learn/carryover information              Patient will benefit from skilled therapeutic intervention in order to improve the following deficits and impairments:   Cognitive communication deficit    Problem List Patient Active Problem List   Diagnosis Date Noted   History of recent  stroke 11/16/2021   Acute renal failure superimposed on stage 3b chronic kidney disease ([place]) 11/16/2021   Cerebral thrombosis with cerebral infarction 10/10/2021   HTN (hypertension) 04/16/7627   Acute metabolic encephalopathy 31/51/7616   HTN (hypertension), malignant 10/09/2021   Essential hypertension 08/17/2018    Manroop Jakubowicz, Annye Rusk, [place] 12/20/2021, 1:34 PM  [facility] [address] [place], [place], [address] Phone: [phone]   Fax:  [phone]   Name: Quinnley Colasurdo MRN: [id number] Date of Birth: 08/31/1951

## 2021-12-21 ENCOUNTER — Encounter: Payer: Self-pay | Admitting: Internal Medicine

## 2021-12-21 ENCOUNTER — Telehealth: Payer: Self-pay

## 2021-12-21 ENCOUNTER — Ambulatory Visit (INDEPENDENT_AMBULATORY_CARE_PROVIDER_SITE_OTHER): Payer: Medicare Other | Admitting: Internal Medicine

## 2021-12-21 DIAGNOSIS — H9313 Tinnitus, bilateral: Secondary | ICD-10-CM | POA: Diagnosis not present

## 2021-12-21 DIAGNOSIS — Z8673 Personal history of transient ischemic attack (TIA), and cerebral infarction without residual deficits: Secondary | ICD-10-CM

## 2021-12-21 NOTE — Telephone Encounter (Signed)
Daughter & patient returned call, informed her that recent lab work showed continued elevated LDL or bad cholesterol at 108 with goal less than 70 although improved (previously at 155) since starting statin. Would recommend continued statin at same dose as it has only been 2 months since starting but do request ensuring follow-up with PCP in 2-3 months for repeat cholesterol levels and if LDL or bad cholesterol remains above goal, would recommend discussion of other statin therapies for ensure adequate management. Advised to call the office back with questions as they had none at is time and was appreciative.

## 2021-12-21 NOTE — Patient Instructions (Signed)
The ringing in the ears could be coming from lipitor.  I would try claritin over the counter to see if this helps some.

## 2021-12-21 NOTE — Progress Notes (Signed)
° °  Subjective:   Patient ID: Darlene [name], female    DOB: 29-Mar-1951, 71 y.o.   MRN: [id number]  HPI The patient is a 71 YO female coming in for new concern.  Review of Systems  Constitutional: Negative.   HENT:  Positive for tinnitus.   Eyes: Negative.   Respiratory:  Negative for cough, chest tightness and shortness of breath.   Cardiovascular:  Negative for chest pain, palpitations and leg swelling.  Gastrointestinal:  Negative for abdominal distention, abdominal pain, constipation, diarrhea, nausea and vomiting.  Musculoskeletal: Negative.   Skin: Negative.   Neurological: Negative.   Psychiatric/Behavioral: Negative.     Objective:  Physical Exam Constitutional:      Appearance: She is well-developed.  HENT:     Head: Normocephalic and atraumatic.     Right Ear: Tympanic membrane and ear canal normal.     Left Ear: Tympanic membrane and ear canal normal.  Cardiovascular:     Rate and Rhythm: Normal rate and regular rhythm.  Pulmonary:     Effort: Pulmonary effort is normal. No respiratory distress.     Breath sounds: Normal breath sounds. No wheezing or rales.  Abdominal:     General: Bowel sounds are normal. There is no distension.     Palpations: Abdomen is soft.     Tenderness: There is no abdominal tenderness. There is no rebound.  Musculoskeletal:     Cervical back: Normal range of motion.  Skin:    General: Skin is warm and dry.  Neurological:     Mental Status: She is alert and oriented to person, place, and time.     Coordination: Coordination normal.    Vitals:   12/21/21 1522  BP: 112/70  Pulse: (!) 51  Temp: 98.7 F (37.1 C)  TempSrc: Oral  SpO2: 96%  Weight: 190 lb (86.2 kg)  Height: 5' 5.5" (1.664 m)    This visit occurred during the SARS-CoV-2 public health emergency.  Safety protocols were in place, including screening questions prior to the visit, additional usage of staff PPE, and extensive cleaning of exam room while observing  appropriate contact time as indicated for disinfecting solutions.   Assessment & Plan:

## 2021-12-21 NOTE — Telephone Encounter (Signed)
LVM rq call back

## 2021-12-21 NOTE — Telephone Encounter (Signed)
-----   Message from Frann Rider, NP sent at 12/20/2021 11:13 AM EST ----- Please call for results - please advise patient/family that recent lab work showed continued elevated LDL or bad cholesterol at 108 with goal less than 70 although improved (previously at 155) since starting statin. Would recommend continued statin at same dose as it has only been 2 months since starting but do request ensuring follow-up with PCP in 2-3 months for repeat cholesterol levels and if LDL or bad cholesterol remains above goal, would recommend discussion of other statin therapies for ensure adequate management.  Thank you.

## 2021-12-22 MED ORDER — ATORVASTATIN CALCIUM 80 MG PO TABS: 80.0000 mg | ORAL_TABLET | Freq: Every day | ORAL | 0 refills | Status: DC

## 2021-12-22 MED ORDER — ASPIRIN 81 MG PO TBEC: 81.0000 mg | DELAYED_RELEASE_TABLET | Freq: Every day | ORAL | 0 refills | Status: AC

## 2021-12-22 MED ORDER — LISINOPRIL-HYDROCHLOROTHIAZIDE 20-12.5 MG PO TABS: 2.0000 | ORAL_TABLET | Freq: Every day | ORAL | 0 refills | Status: DC

## 2021-12-22 NOTE — Telephone Encounter (Signed)
Refilled medication

## 2021-12-24 ENCOUNTER — Encounter: Payer: Self-pay | Admitting: Internal Medicine

## 2021-12-24 DIAGNOSIS — H9319 Tinnitus, unspecified ear: Secondary | ICD-10-CM | POA: Insufficient documentation

## 2021-12-24 NOTE — Assessment & Plan Note (Signed)
She is having new side effect from lipitor 80 mg daily of tinnitus however given recent stroke I have recommended her to stay on this. She is also taking aspirin 81 mg daily which I advised her to continue taking. BP at goal on lisinopril/hctz 40/25 mg daily which she will continue.  ?

## 2021-12-24 NOTE — Assessment & Plan Note (Signed)
Daughter and patient suspect that lipitor is causing tinnitus as this started when she started this after stroke but was not a symptom of the stroke. This is listed as a side effect. Asked them to try zyrtec daily to see if this may help. Advised distraction and sound machine to help. She needs to stay on lipitor in the setting of recent stroke. No hearing deficit.  ?

## 2021-12-27 ENCOUNTER — Ambulatory Visit: Payer: Medicare Other | Attending: Internal Medicine | Admitting: Speech Pathology

## 2021-12-27 ENCOUNTER — Other Ambulatory Visit: Payer: Self-pay

## 2021-12-27 ENCOUNTER — Encounter: Payer: Self-pay | Admitting: Speech Pathology

## 2021-12-27 DIAGNOSIS — R41841 Cognitive communication deficit: Secondary | ICD-10-CM | POA: Insufficient documentation

## 2021-12-27 NOTE — Therapy (Signed)
[place] [facility] ?[place], [place], [address] ?Phone: [phone]   Fax:  [phone] ? ?Speech Language Pathology Treatment ? ?Patient Details  ?Name: Darlene [name] ?MRN: [id number] ?Date of Birth: 06-26-1951 ?Referring Provider (SLP): Frann Rider NP ? ? ?Encounter Date: 12/27/2021 ? ? End of Session - 12/27/21 1035   ? ? Visit Number 3   ? Number of Visits 25   ? Date for SLP Re-Evaluation 03/11/22   ? SLP Start Time 0930   ? SLP Stop Time  1015   ? SLP Time Calculation (min) 45 min   ? Activity Tolerance Patient tolerated treatment well   ? ?  ?  ? ?  ? ? ?Past Medical History:  ?Diagnosis Date  ? Hypertension   ? ? ?History reviewed. No pertinent surgical history. ? ?There were no vitals filed for this visit. ? ? Subjective Assessment - 12/27/21 0933   ? ? Subjective "I did some cooking"   ? Patient is accompained by: Family member   son Rupert Stacks"  ? Currently in Pain? No/denies   ? ?  ?  ? ?  ? ? ? ? ? ? ? ? ADULT SLP TREATMENT - 12/27/21 0935   ? ?  ? General Information  ? Behavior/Cognition Alert;Cooperative;Pleasant mood   ?  ? Treatment Provided  ? Treatment provided Cognitive-Linquistic   ?  ? Cognitive-Linquistic Treatment  ? Treatment focused on Cognition;Patient/family/caregiver education   ? Skilled Treatment Yarenis and her son Payton Spark report she had increased daily activities and is completing household tasks. She continues to re-set passwords, instructed her toset up websites to use thumbprint ID. Targeted alternating attention, attention to detail, and organization plotting employees hours worked with totals. Brexlee required extended time consistently, and usual min to mod A to ID errors. She alternated attention from chart to calculator and with conversation with occasional min A to ID errors and self correct errors. Garey Ham ID'd 3 activities she did prior to CVA - instructed her to return to 1-2 of them this week  (exercise bike, lunch with friends, restaurant,) Educated her to preview menus on line prior to event to select low sodium options. Have supervision with exercise   ?  ? Assessment / Recommendations / Plan  ? Plan Continue with current plan of care   ?  ? Progression Toward Goals  ? Progression toward goals Progressing toward goals   ? ?  ?  ? ?  ? ? ? SLP Education - 12/27/21 1031   ? ? Education Details daily brain activities; remember to bring binder and HW to ST   ? Person(s) Educated Patient;Child(ren)   ? Methods Explanation;Verbal cues;Handout   ? Comprehension Verbalized understanding;Verbal cues required;Need further instruction;Returned demonstration   ? ?  ?  ? ?  ? ? ? SLP Short Term Goals - 12/27/21 1034   ? ?  ? SLP SHORT TERM GOAL #1  ? Title Pt will use calendar, crossing out, to be oriented & daily and weekly appointments and schedule with occasional min from family over 1 week   ? Time 3   ? Period Weeks   ? Status On-going   ?  ? SLP SHORT TERM GOAL #2  ? Title Pt will use 1 organized password notebook to recall 4 of her most frequent passwords/websites with occasional min A over 3 sessions   ? Time 3   ? Period Weeks   ? Status On-going   ?  ?  SLP SHORT TERM GOAL #3  ? Title Pt will use timer to complete organization tasks to work 20-30 minutes with breaks to reduce fatigue and support attention/concenstration on 2 tasks over 1 week   ? Time 3   ? Period Weeks   ? Status On-going   ?  ? SLP SHORT TERM GOAL #4  ? Title Pt will increase household participation in shopping, menu planning and light cooking with supervision from family   ? Time 3   ? Period Weeks   ? Status On-going   ? ?  ?  ? ?  ? ? ? SLP Long Term Goals - 12/27/21 1035   ? ?  ? SLP LONG TERM GOAL #1  ? Title Pt will manage todo lists and schedule using external aids with rare min A over 1 weeks   ? Time 11   ? Period Weeks   ? Status On-going   ?  ? SLP LONG TERM GOAL #2  ? Title Pt/family will verbalize 3 strategies or  accomodations for cognitive impairments for successful return to work as able   ? Time 11   ? Period Weeks   ? Status On-going   ?  ? SLP LONG TERM GOAL #3  ? Title Pt will complete counting $ and making change accruately with mild distractions (selective attention) and ID errors 4/5x with usual min A   ? Time 11   ? Period Weeks   ? Status On-going   ?  ? SLP LONG TERM GOAL #4  ? Title Pt will alternate attention between 2 mildly complex cognitive tasks, IDing errors with occasional min A over 2 sessions   ? Time 11   ? Period Weeks   ? Status On-going   ?  ? SLP LONG TERM GOAL #5  ? Title Pt will improve score of Cognition Function Short Form my 2 points   ? Baseline score of 28   ? Time 11   ? Period Weeks   ? Status On-going   ? ?  ?  ? ?  ? ? ? Plan - 12/27/21 1032   ? ? Clinical Impression Statement Moderate cognitive linguistic impairments persist - Akirra continues to require A to keep calendar, manage her appointments and carryover compensations for attention and memory. 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 Ongoing training in compensations for cognition for pt and family. Continue skilled ST to maximize cognition for safety, to reduce caregiver burden and possibly return to work in some capacity   ? Speech Therapy Frequency 1x /week   ? Duration 12 weeks   ? Treatment/Interventions Environmental controls;Cueing hierarchy;Compensatory strategies;Functional tasks;Cognitive reorganization;Compensatory techniques;Patient/family education;Internal/external aids;SLP instruction and feedback   ? ?  ?  ? ?  ? ? ?Patient will benefit from skilled therapeutic intervention in order to improve the following deficits and impairments:   ?Cognitive communication deficit ? ? ? ?Problem List ?Patient Active Problem List  ? Diagnosis Date Noted  ? Tinnitus 12/24/2021  ? History of recent stroke 11/16/2021  ? Acute renal failure superimposed on stage 3b chronic kidney disease ([place]) 11/16/2021   ? Cerebral thrombosis with cerebral infarction 10/10/2021  ? HTN (hypertension) 10/10/2021  ? Acute metabolic encephalopathy 32/35/5732  ? 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 Essential hypertension 08/17/2018  ? ? ?Khyrie Masi, Annye Rusk, CCC-SLP ?12/27/2021, 10:36 AM ? ?[facility] ?[place] ?[place], [place], [address] ?Phone:  [phone]   Fax:  [phone] ? ? ?Name: Genita Nilsson ?MRN: [id number] ?Date of Birth: 1950-11-07 ? ?

## 2021-12-27 NOTE — Patient Instructions (Signed)
? ?  Binder - bring it and all HW back to ST ? ?Great job getting some exercise - keep it up! ? ?Do brain activities everyday ? ?Try to get your passwords set up with thumb print ? ?Lunch with your retired friends ? ? ?

## 2021-12-29 ENCOUNTER — Other Ambulatory Visit: Payer: Medicare Other

## 2021-12-29 ENCOUNTER — Ambulatory Visit (INDEPENDENT_AMBULATORY_CARE_PROVIDER_SITE_OTHER): Payer: Medicare Other

## 2021-12-29 DIAGNOSIS — I639 Cerebral infarction, unspecified: Secondary | ICD-10-CM

## 2021-12-29 LAB — CUP PACEART REMOTE DEVICE CHECK
Date Time Interrogation Session: 20230308091405
Implantable Pulse Generator Implant Date: 20230131

## 2022-01-03 ENCOUNTER — Other Ambulatory Visit: Payer: Self-pay

## 2022-01-03 ENCOUNTER — Encounter: Payer: Self-pay | Admitting: Speech Pathology

## 2022-01-03 ENCOUNTER — Ambulatory Visit: Payer: Medicare Other | Admitting: Speech Pathology

## 2022-01-03 DIAGNOSIS — R41841 Cognitive communication deficit: Secondary | ICD-10-CM | POA: Diagnosis not present

## 2022-01-03 NOTE — Therapy (Signed)
[facility] ?[place] ?[place], [place], [address] ?Phone: [phone]   Fax:  [phone] ? ?Speech Language Pathology Treatment ? ?Patient Details  ?Name: Darlene [name] ?MRN: [id number] ?Date of Birth: 05/12/1951 ?Referring Provider (SLP): Frann Rider NP ? ? ?Encounter Date: 01/03/2022 ? ? End of Session - 01/03/22 1217   ? ? Visit Number 4   ? Number of Visits 25   ? SLP Start Time 0845   ? SLP Stop Time  0930   ? SLP Time Calculation (min) 45 min   ? Activity Tolerance Patient tolerated treatment well   ? ?  ?  ? ?  ? ? ?Past Medical History:  ?Diagnosis Date  ? Hypertension   ? ? ?History reviewed. No pertinent surgical history. ? ?There were no vitals filed for this visit. ? ? Subjective Assessment - 01/03/22 0851   ? ? Subjective "I went to [employer]"   ? Patient [name] accompained by: Family member   Darlene [name]  ? Currently in Pain? No/denies   ? ?  ?  ? ?  ? ? ? ? ? ? ? ? ADULT SLP TREATMENT - 01/03/22 0852   ? ?  ? General Information  ? Behavior/Cognition Alert;Cooperative;Pleasant mood   ?  ? Treatment Provided  ? Treatment provided Cognitive-Linquistic   ?  ? Cognitive-Linquistic Treatment  ? Treatment focused on Cognition;Patient/family/caregiver education   ? Skilled Treatment Darlene [name] reports she has cooked and been to [employer]. She [name] more awake during the day. We generated 3 activities she [name] to do this week. Darlene [name] did not bring Hw back but did complete it. Today, we targeted attention to detail, organization and error awareness with functional time math with usual mod verbal cues to set up and solve problems. Darlene [name] ID 3 ways to ID recipes for low sodium diet and 2 strategies to find out if a restaurant has low sodium heart healthy options. She will bring in 3 recipes/websites and 3 restaurant options for [facility]. Darlene [name] will walk 30 minutes twice a week as well   ?  ? Assessment / Recommendations / Plan  ? Plan Continue with current  plan of care   ?  ? Progression Toward Goals  ? Progression toward goals Progressing toward goals   ? ?  ?  ? ?  ? ? ? ? ? SLP Short Term Goals - 01/03/22 1222   ? ?  ? SLP SHORT TERM GOAL #1  ? Title Pt will use calendar, crossing out, to be oriented & daily and weekly appointments and schedule with occasional min from family over 1 week   ? Time 2   ? Period Weeks   ? Status On-going   ?  ? SLP SHORT TERM GOAL #2  ? Title Pt will use 1 organized password notebook to recall 4 of her most frequent passwords/websites with occasional min A over 3 sessions   ? Time 2   ? Period Weeks   ? Status On-going   ?  ? SLP SHORT TERM GOAL #3  ? Title Pt will use timer to complete organization tasks to work 20-30 minutes with breaks to reduce fatigue and support attention/concenstration on 2 tasks over 1 week   ? Time 2   ? Period Weeks   ? Status On-going   ?  ? SLP SHORT TERM GOAL #4  ? Title Pt will increase household participation in shopping, menu planning and light cooking with supervision from family   ?  Time 2   ? Period Weeks   ? Status On-going   ? ?  ?  ? ?  ? ? ? SLP Long Term Goals - 01/03/22 1223   ? ?  ? SLP LONG TERM GOAL #1  ? Title Pt will manage todo lists and schedule using external aids with rare min A over 1 weeks   ? Time 10   ? Period Weeks   ? Status On-going   ?  ? SLP LONG TERM GOAL #2  ? Title Pt/family will verbalize 3 strategies or accomodations for cognitive impairments for successful return to work as able   ? Time 10   ? Period Weeks   ? Status On-going   ?  ? SLP LONG TERM GOAL #3  ? Title Pt will complete counting $ and making change accruately with mild distractions (selective attention) and ID errors 4/5x with usual min A   ? Time 10   ? Period Weeks   ? Status On-going   ?  ? SLP LONG TERM GOAL #4  ? Title Pt will alternate attention between 2 mildly complex cognitive tasks, IDing errors with occasional min A over 2 sessions   ? Time 10   ? Period Weeks   ? Status On-going   ?  ? SLP LONG  TERM GOAL #5  ? Title Pt will improve score of Cognition Function Short Form my 2 points   ? Baseline score of 28   ? Time 10   ? Period Weeks   ? Status On-going   ? ?  ?  ? ?  ? ? ? Plan - 01/03/22 1221   ? ? Clinical Impression Statement Moderate cognitive linguistic impairments persist - Darlene [name] improving independence in  keeping calendar, manage her appointments and carryover compensations for attention and memory.  She [name] IDing strategies to coomplete IADL's and cognitive tasks. 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 Ongoing training in compensations for cognition for pt and family. Continue skilled ST to maximize cognition for safety, to reduce caregiver burden and possibly return to work in some capacity   ? Speech Therapy Frequency 1x /week   ? Duration 12 weeks   ? Treatment/Interventions Environmental controls;Cueing hierarchy;Compensatory strategies;Functional tasks;Cognitive reorganization;Compensatory techniques;Patient/family education;Internal/external aids;SLP instruction and feedback   ? Potential to Achieve Goals Good   ? Potential Considerations Ability to learn/carryover information   ? ?  ?  ? ?  ? ? ?Patient will benefit from skilled therapeutic intervention in order to improve the following deficits and impairments:   ?Cognitive communication deficit ? ? ? ?Problem List ?Patient Active Problem List  ? Diagnosis Date Noted  ? Tinnitus 12/24/2021  ? History of recent stroke 11/16/2021  ? Acute renal failure superimposed on stage 3b chronic kidney disease ([place]) 11/16/2021  ? Cerebral thrombosis with cerebral infarction 10/10/2021  ? HTN (hypertension) 10/10/2021  ? Acute metabolic encephalopathy 17/51/0258  ? 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 Essential hypertension 08/17/2018  ? ? ?Darlene [name], Annye Rusk, CCC-SLP ?01/03/2022, 12:25 PM ? ?[place] ?[place] ?[place], [place], [address] ?Phone:  [phone]   Fax:  [phone] ? ? ?Name: Darlene [name] ?MRN: [id number] ?Date of Birth: [birth year] ? ?

## 2022-01-03 NOTE — Patient Instructions (Addendum)
? ?  No excuses! ? ?We are trying to find ways that are functional for you to get back to doing what you used to - in order to get back to driving or working ? ?Get out of the house 3x this week - walk 30 minutes, go shopping, [place], lunch with friends ? ?Tanina, you are telling me that you want to get back to doing so you need to start doing what you did before the stroke (cooking, cleaning, exercising, walking) ? ?Look up 3 restaurants & low sodium or heart healthy ([employer] , [employer], and 1 other places you would eat) - if you can't find it online, call and ask them what their low sodium or hearth healthy options are  ? ?Look up 3 heart healthy recipes - write the site and the dishes ? ?Write down and bring in where you went and the menu items you found  ? ? ? ? ? ? ? ? ? ? ? ? ?

## 2022-01-10 ENCOUNTER — Ambulatory Visit: Payer: Medicare Other | Admitting: Speech Pathology

## 2022-01-10 ENCOUNTER — Other Ambulatory Visit: Payer: Self-pay

## 2022-01-10 ENCOUNTER — Encounter: Payer: Self-pay | Admitting: Speech Pathology

## 2022-01-10 DIAGNOSIS — R41841 Cognitive communication deficit: Secondary | ICD-10-CM | POA: Diagnosis not present

## 2022-01-10 NOTE — Patient Instructions (Addendum)
? ?  When you are putting things down or away - stop - take a moment to pay attention and state aloud what you are doing  ? ?"I am putting the remote on the couch" or "I am putting the blender in the lower cabinet" ? ?This week, pay attention and try to recall 3-4 details about the News or shows you watch. Your son and the speech therapist will ask you what you remember and have a conversation about it ? ?No phone with TV so you can be mindful to pay attention and try to remember details to tell us ? ? ?

## 2022-01-10 NOTE — Therapy (Signed)
[facility] ?[address] ?[place], [place], [address] ?Phone: [phone]   Fax:  [phone] ? ?Speech Language Pathology Treatment ? ?Patient Details  ?Name: Darlene [name] ?MRN: [id number] ?Date of Birth: 05/20/1951 ?Referring Provider (SLP): Jessica McCue NP ? ? ?Encounter Date: 01/10/2022 ? ? End of Session - 01/10/22 1231   ? ? Visit Number 5   ? Number of Visits 25   ? Date for SLP Re-Evaluation 03/11/22   ? SLP Start Time 1015   ? SLP Stop Time  1100   ? SLP Time Calculation (min) 45 min   ? Activity Tolerance Patient tolerated treatment well   ? ?  ?  ? ?  ? ? ?Past Medical History:  ?Diagnosis Date  ? Hypertension   ? ? ?History reviewed. No pertinent surgical history. ? ?There were no vitals filed for this visit. ? ? Subjective Assessment - 01/10/22 1232   ? ? Subjective "I have been doing cross words and jigsaw puzzles"   ? Patient is accompained by: Family member   Darlene [name]  ? Currently in Pain? No/denies   ? ?  ?  ? ?  ? ? ? ? ? ? ? ? ADULT SLP TREATMENT - 01/10/22 1021   ? ?  ? General Information  ? Behavior/Cognition Alert;Cooperative;Pleasant mood   ?  ? Treatment Provided  ? Treatment provided Cognitive-Linquistic   ?  ? Cognitive-Linquistic Treatment  ? Treatment focused on Cognition;Patient/family/caregiver education   ? Skilled Treatment Darlene [name] has returned to cooking some meals and cleaning. She did HW of looking up low sodium recipes and salt alternatives. She is doing crosswords and puzzles at home. She and her son continue to endorse memory problems. She is losing items. We generated strategy of saying aloud where she is putting things when she is cleaning to A with attention and memory. This week, she is to target memory of News, GMA and talk shows by writng a few notes about what she watches. Darlene [name] will call and ask details about her shows. Targeted altenating attention today between complex naming tasks and conversation.  Over 20 minutes, she  required re-direction 3x and 4 verbal cues to recall instructions and letter for the naming tasks. She remembered binder, however prior HW not complete. Instructed her to complete HW for next session.   ?  ? Assessment / Recommendations / Plan  ? Plan Continue with current plan of care   ?  ? Progression Toward Goals  ? Progression toward goals Progressing toward goals   ? ?  ?  ? ?  ? ? ? SLP Education - 01/10/22 1232   ? ? Education Details use notes to recall news and shows   ? Person(s) Educated Patient;Child(ren)   ? Methods Explanation;Verbal cues;Handout   ? Comprehension Verbalized understanding;Need further instruction   ? ?  ?  ? ?  ? ? ? SLP Short Term Goals - 01/10/22 1234   ? ?  ? SLP SHORT TERM GOAL #1  ? Title Pt will use calendar, crossing out, to be oriented & daily and weekly appointments and schedule with occasional min from family over 1 week   ? Time 2   ? Period Weeks   ? Status Achieved   ?  ? SLP SHORT TERM GOAL #2  ? Title Pt will use 1 organized password notebook to recall 4 of her most frequent passwords/websites with occasional min A over 3 sessions   ? Time   1   ? Period Weeks   ? Status On-going   ?  ? SLP SHORT TERM GOAL #3  ? Title Pt will use timer to complete organization tasks to work 20-30 minutes with breaks to reduce fatigue and support attention/concenstration on 2 tasks over 1 week   ? Time 1   ? Period Weeks   ? Status Not Met   ?  ? SLP SHORT TERM GOAL #4  ? Title Pt will increase household participation in shopping, menu planning and light cooking with supervision from family   ? Time 2   ? Period Weeks   ? Status Achieved   ? ?  ?  ? ?  ? ? ? SLP Long Term Goals - 01/10/22 1234   ? ?  ? SLP LONG TERM GOAL #1  ? Title Pt will manage todo lists and schedule using external aids with rare min A over 1 weeks   ? Baseline 01/10/22   ? Time 9   ? Period Weeks   ? Status On-going   ?  ? SLP LONG TERM GOAL #2  ? Title Pt/family will verbalize 3 strategies or  accomodations for cognitive impairments for successful return to work as able   ? Time 9   ? Period Weeks   ? Status On-going   ?  ? SLP LONG TERM GOAL #3  ? Title Pt will complete counting $ and making change accruately with mild distractions (selective attention) and ID errors 4/5x with usual min A   ? Time 9   ? Period Weeks   ? Status On-going   ?  ? SLP LONG TERM GOAL #4  ? Title Pt will alternate attention between 2 mildly complex cognitive tasks, IDing errors with occasional min A over 2 sessions   ? Time 9   ? Period Weeks   ? Status On-going   ?  ? SLP LONG TERM GOAL #5  ? Title Pt will improve score of Cognition Function Short Form my 2 points   ? Baseline score of 28   ? Time 9   ? Period Weeks   ? Status On-going   ? ?  ?  ? ?  ? ? ? Plan - 01/10/22 1233   ? ? Clinical Impression Statement Moderate cognitive linguistic impairments persist - Darlene [name] is   keeping calendar, manage her appointments and carryover compensations for attention and memory.  She is IDing strategies to coomplete IADL's and cognitive tasks. 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 Short term memory remains impaired. This week she will use notetaking as strategy to recall news and shows.  Ongoing training in compensations for cognition for pt and family. Continue skilled ST to maximize cognition for safety, to reduce caregiver burden and possibly return to work in some capacity   ? Speech Therapy Frequency 2x / week   ? Duration 12 weeks   ? Treatment/Interventions Environmental controls;Cueing hierarchy;Compensatory strategies;Functional tasks;Cognitive reorganization;Compensatory techniques;Patient/family education;Internal/external aids;SLP instruction and feedback   ? ?  ?  ? ?  ? ? ?Patient will benefit from skilled therapeutic intervention in order to improve the following deficits and impairments:   ?Cognitive communication deficit ? ? ? ?Problem List ?Patient Active Problem List  ? Diagnosis Date  Noted  ? Tinnitus 12/24/2021  ? History of recent stroke 11/16/2021  ? Acute renal failure superimposed on stage 3b chronic kidney disease ([place]) 11/16/2021  ? Cerebral thrombosis with  cerebral infarction 10/10/2021  ? HTN (hypertension) 10/10/2021  ? Acute metabolic encephalopathy 15/72/6203  ? 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 Essential hypertension 08/17/2018  ? ? ?Darlene [name], Annye Rusk, CCC-SLP ?01/10/2022, 12:35 PM ? ?[place] ?[place] ?[place], [place], [address] ?Phone: [phone]   Fax:  [phone] ? ? ?Name: Darlene [name] ?MRN: [id number] ?Date of Birth: Sep 25, 1951 ? ?

## 2022-01-12 NOTE — Progress Notes (Signed)
[facility] Summary Report / Loop Recorder 

## 2022-01-13 ENCOUNTER — Other Ambulatory Visit: Payer: Self-pay | Admitting: Emergency Medicine

## 2022-01-13 ENCOUNTER — Encounter: Payer: Self-pay | Admitting: Emergency Medicine

## 2022-01-13 MED ORDER — LORAZEPAM 1 MG PO TABS: ORAL_TABLET | ORAL | 0 refills | Status: DC

## 2022-01-13 NOTE — Telephone Encounter (Signed)
Called patient daughter to inform her that the prescription for ativan was sent to pharmacy on file  ?

## 2022-01-13 NOTE — Telephone Encounter (Signed)
Prescription for Ativan sent to pharmacy of record.  Recommend to take 1 mg tablet 1 hour before procedure.  Thanks.

## 2022-01-17 ENCOUNTER — Other Ambulatory Visit: Payer: Self-pay

## 2022-01-17 ENCOUNTER — Ambulatory Visit: Payer: Medicare Other | Admitting: Speech Pathology

## 2022-01-17 ENCOUNTER — Encounter: Payer: Self-pay | Admitting: Speech Pathology

## 2022-01-17 DIAGNOSIS — R41841 Cognitive communication deficit: Secondary | ICD-10-CM | POA: Diagnosis not present

## 2022-01-17 NOTE — Therapy (Signed)
[facility] ?[place] ?[place], [place], [address] ?Phone: [phone]   Fax:  [phone] ? ?Speech Language Pathology Treatment ? ?Patient Details  ?Name: Darlene [name] ?MRN: [id number] ?Date of Birth: 20-Sep-1951 ?Referring Provider (SLP): Frann Rider NP ? ? ?Encounter Date: 01/17/2022 ? ? End of Session - 01/17/22 1123   ? ? Visit Number 6   ? Number of Visits 25   ? Date for SLP Re-Evaluation 03/11/22   ? SLP Start Time 0930   ? SLP Stop Time  1013   ? SLP Time Calculation (min) 43 min   ? Activity Tolerance Patient tolerated treatment well   ? ?  ?  ? ?  ? ? ?Past Medical History:  ?Diagnosis Date  ? Hypertension   ? ? ?History reviewed. No pertinent surgical history. ? ?There were no vitals filed for this visit. ? ? Subjective Assessment - 01/17/22 0940   ? ? Subjective "I cooked, went shopping, and cleaned out the garage"   ? Patient is accompained by: Family member   Antwone  ? Currently in Pain? No/denies   ? ?  ?  ? ?  ? ? ? ? ? ? ? ? ADULT SLP TREATMENT - 01/17/22 0940   ? ?  ? General Information  ? Behavior/Cognition Alert;Cooperative;Pleasant mood   ?  ? Treatment Provided  ? Treatment provided Cognitive-Linquistic   ?  ? Cognitive-Linquistic Treatment  ? Treatment focused on Cognition;Patient/family/caregiver education   ? 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 We generated strategies of using a timer and working for limited time (30 minutes) on her garage and kitchen clean out/organize to compensate for attention impairments. She continues to keep up with  her appointments and recalled that she had an MRI next week. This was not in ther therpray caledar as it is on a  Sunday. She thought it was a Monday, but did remember she scheduled the weekend so her daughter could come. Today, targeted written expression and error awareness by correcting errors on a menu. She ID'd errors with usual min questioning cues. She ID'd her own spelling errors with occasional min cues, and self corrected them with rare min A.   ?  ? Assessment / Recommendations / Plan  ? Plan Continue with current plan of care   ?  ? Progression Toward Goals  ? Progression toward goals Progressing toward goals   ? ?  ?  ? ?  ? ? ? ? ? SLP Short Term Goals - 01/17/22 1123   ? ?  ? SLP SHORT TERM GOAL #1  ? Title Pt will use calendar, crossing out, to be oriented & daily and weekly appointments and schedule with occasional min from family over 1 week   ? Time 2   ? Period Weeks   ? Status Achieved   ?  ? SLP SHORT TERM GOAL #2  ? Title Pt will use 1 organized password notebook to recall 4 of her most frequent passwords/websites with occasional min A over 3 sessions   ? Time 1   ? Period Weeks   ? Status Partially Met   ?  ? SLP SHORT TERM GOAL #3  ? Title Pt will use timer to complete organization tasks  to work 20-30 minutes with breaks to reduce fatigue and support attention/concenstration on 2 tasks over 1 week   ? Time 1   ? Period Weeks   ? Status Not Met   ?  ? SLP SHORT TERM GOAL #4  ? Title Pt will increase household participation in shopping, menu planning and light cooking with supervision from family   ? Time 2   ? Period Weeks   ? Status Achieved   ? ?  ?  ? ?  ? ? ? SLP Long Term Goals - 01/17/22 1124   ? ?  ? SLP LONG TERM GOAL #1  ? Title Pt will manage todo lists and schedule using external aids with rare min A over 1 weeks   ? Baseline 01/10/22   ? Time 8   ? Period Weeks   ? Status On-going   ?  ? SLP LONG TERM GOAL #2  ? Title Pt/family will verbalize 3 strategies or accomodations for cognitive impairments for successful return to work as able   ? Time 8   ? Period Weeks   ? Status On-going   ?  ? SLP  LONG TERM GOAL #3  ? Title Pt will complete counting $ and making change accruately with mild distractions (selective attention) and ID errors 4/5x with usual min A   ? Time 8   ? Period Weeks   ? Status On-going   ?  ? SLP LONG TERM GOAL #4  ? Title Pt will alternate attention between 2 mildly complex cognitive tasks, IDing errors with occasional min A over 2 sessions   ? Time 8   ? Period Weeks   ? Status On-going   ?  ? SLP LONG TERM GOAL #5  ? Title Pt will improve score of Cognition Function Short Form my 2 points   ? Baseline score of 28   ? Time 8   ? Period Weeks   ? Status On-going   ? ?  ?  ? ?  ? ? ? ? ?Patient will benefit from skilled therapeutic intervention in order to improve the following deficits and impairments:   ?Cognitive communication deficit ? ? ? ?Problem List ?Patient Active Problem List  ? Diagnosis Date Noted  ? Tinnitus 12/24/2021  ? History of recent stroke 11/16/2021  ? Acute renal failure superimposed on stage 3b chronic kidney disease (HCC) 11/16/2021  ? Cerebral thrombosis with cerebral infarction 10/10/2021  ? HTN (hypertension) 10/10/2021  ? Acute metabolic encephalopathy 10/09/2021  ? 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 Essential hypertension 08/17/2018  ? ? ?Lovvorn, Laura Ann, CCC-SLP ?01/17/2022, 11:24 AM ? ?[facility] ?[address] ?[place], [place], [address] ?Phone: [phone]   Fax:  [phone] ? ? ?Name: Darlene [name] ?MRN: [id number] ?Date of Birth: 04/26/1951 ? ?

## 2022-01-19 ENCOUNTER — Other Ambulatory Visit: Payer: Medicare Other

## 2022-01-23 ENCOUNTER — Ambulatory Visit
Admission: RE | Admit: 2022-01-23 | Discharge: 2022-01-23 | Disposition: A | Discharge status: Home / Self Care | Payer: Medicare Other | Source: Ambulatory Visit | Attending: Adult Health | Admitting: Adult Health

## 2022-01-23 DIAGNOSIS — D320 Benign neoplasm of cerebral meninges: Secondary | ICD-10-CM | POA: Diagnosis not present

## 2022-01-23 MED ORDER — GADOBENATE DIMEGLUMINE 529 MG/ML IV SOLN
18.0000 mL | Freq: Once | INTRAVENOUS | Status: AC | PRN
  Administered 2022-01-23: 18 mL via INTRAVENOUS

## 2022-01-24 ENCOUNTER — Ambulatory Visit: Payer: Medicare Other | Attending: Internal Medicine | Admitting: Speech Pathology

## 2022-01-24 ENCOUNTER — Ambulatory Visit: Payer: Medicare Other | Admitting: Speech Pathology

## 2022-01-24 ENCOUNTER — Encounter: Payer: Self-pay | Admitting: Speech Pathology

## 2022-01-24 DIAGNOSIS — R41841 Cognitive communication deficit: Secondary | ICD-10-CM | POA: Insufficient documentation

## 2022-01-24 NOTE — Therapy (Signed)
?[facility] ?[place], [place], [address] ?Phone: [phone]   Fax:  [phone] ? ?Speech Language Pathology Treatment ? ?Patient Details  ?Name: Darlene [name] ?MRN: [id number] ?Date of Birth: 1951/05/16 ?Referring Provider (SLP): Frann Rider NP ? ? ?Encounter Date: 01/24/2022 ? ? End of Session - 01/24/22 1443   ? ? Visit Number 7   ? Number of Visits 25   ? Date for SLP Re-Evaluation 03/11/22   ? SLP Start Time 1015   ? SLP Stop Time  1100   ? SLP Time Calculation (min) 45 min   ? Activity Tolerance Patient tolerated treatment well   ? ?  ?  ? ?  ? ? ?Past Medical History:  ?Diagnosis Date  ? Hypertension   ? ? ?History reviewed. No pertinent surgical history. ? ?There were no vitals filed for this visit. ? ? Subjective Assessment - 01/24/22 1022   ? ? Subjective "She got out her [employer] decorations"   ? Patient is accompained by: Family member   daughter, Virgilio Belling  ? Currently in Pain? Yes   ? ?  ?  ? ?  ? ? ? ? ? ? ? ? ADULT SLP TREATMENT - 01/24/22 1022   ? ?  ? General Information  ? Behavior/Cognition Alert;Cooperative;Pleasant mood   ?  ? Treatment Provided  ? Treatment provided Cognitive-Linquistic   ?  ? Cognitive-Linquistic Treatment  ? Treatment focused on Cognition;Patient/family/caregiver education   ? Skilled Treatment Cachet returns with HW incomplete. She recalled 2 current events from the news this past week by using 3 written reminders in her therapy notebook but did not recall if she cooked or shopped. She thought she cooked an egg but her daughter disagreed. Memory continues to be impaired affecting her ability to return to work. We generated strategy of using a memory journal to recall 3-4 activiites, shows, conversations each day. Sydnie has several notebooks her daughter has provided. They are to pick 1 and start daily journal and bring in journal to Andrews for accountability. Targeted alternating attention in complex  word finding task - Haile ID'd errors and alternated between task and conversation with 1 re-direction over 12 minutes. She continues to required cues to not excuse her impairments or lack of follow up ,   ?  ? Assessment / Recommendations / Plan  ? Plan Continue with current plan of care   ?  ? Progression Toward Goals  ? Progression toward goals Progressing toward goals   ? ?  ?  ? ?  ? ? ? SLP Education - 01/24/22 1451   ? ? Education Details memory book - 3-4 activities a day   ? Person(s) Educated Patient;Child(ren)   ? Methods Explanation;Demonstration;Verbal cues;Handout   ? Comprehension Verbalized understanding;Returned demonstration;Need further instruction   ? ?  ?  ? ?  ? ? ? SLP Short Term Goals - 01/24/22 1450   ? ?  ? SLP SHORT TERM GOAL #1  ? Title Pt will use calendar, crossing out, to be oriented & daily and weekly appointments and schedule with occasional min from family over 1 week   ? Time 2   ? Period Weeks   ? Status Achieved   ?  ? SLP SHORT TERM GOAL #2  ? Title Pt will use 1 organized password notebook to recall 4 of her most frequent passwords/websites with occasional min A over 3 sessions   ? Time 1   ? Period  Weeks   ? Status Partially Met   ?  ? SLP SHORT TERM GOAL #3  ? Title Pt will use timer to complete organization tasks to work 20-30 minutes with breaks to reduce fatigue and support attention/concenstration on 2 tasks over 1 week   ? Time 1   ? Period Weeks   ? Status Not Met   ?  ? SLP SHORT TERM GOAL #4  ? Title Pt will increase household participation in shopping, menu planning and light cooking with supervision from family   ? Time 2   ? Period Weeks   ? Status Achieved   ? ?  ?  ? ?  ? ? ? SLP Long Term Goals - 01/24/22 1450   ? ?  ? SLP LONG TERM GOAL #1  ? Title Pt will manage todo lists and schedule using external aids with rare min A over 1 weeks   ? Baseline 01/10/22; 01/24/22   ? Time 7   ? Period Weeks   ? Status On-going   ?  ? SLP LONG TERM GOAL #2  ? Title Pt/family  will verbalize 3 strategies or accomodations for cognitive impairments for successful return to work as able   ? Time 7   ? Period Weeks   ? Status On-going   ?  ? SLP LONG TERM GOAL #3  ? Title Pt will complete counting $ and making change accruately with mild distractions (selective attention) and ID errors 4/5x with usual min A   ? Time 7   ? Period Weeks   ? Status On-going   ?  ? SLP LONG TERM GOAL #4  ? Title Pt will alternate attention between 2 mildly complex cognitive tasks, IDing errors with occasional min A over 2 sessions   ? Time 7   ? Period Weeks   ? Status On-going   ?  ? SLP LONG TERM GOAL #5  ? Title Pt will improve score of Cognition Function Short Form my 2 points   ? Baseline score of 28   ? Time 7   ? Period Weeks   ? Status On-going   ? ?  ?  ? ?  ? ? ? Plan - 01/24/22 1448   ? ? Clinical Impression Statement Moderate cognitive linguistic impairments persist - Eriko is   keeping calendar, manage her appointments and carryover compensations for attention and memory.  She is IDing strategies to coomplete IADL's and cognitive tasks. 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 Short term memory remains impaired. This week she will use notetaking as strategy to recall news and  as well as impletment a memory notebook to recall 3-4 activities she does each day.  Ongoing training in compensations for cognition for pt and family. Continue skilled ST to maximize cognition for safety, to reduce caregiver burden and possibly return to work in some capacity   ? Speech Therapy Frequency 2x / week   ? Duration 12 weeks   ? Treatment/Interventions Environmental controls;Cueing hierarchy;Compensatory strategies;Functional tasks;Cognitive reorganization;Compensatory techniques;Patient/family education;Internal/external aids;SLP instruction and feedback   ? Potential to Achieve Goals Good   ? Potential Considerations Ability to learn/carryover information   ? ?  ?  ? ?  ? ? ?Patient  will benefit from skilled therapeutic intervention in order to improve the following deficits and impairments:   ?Cognitive communication deficit ? ? ? ?Problem List ?Patient Active Problem List  ? Diagnosis Date Noted  ?  Tinnitus 12/24/2021  ? History of recent stroke 11/16/2021  ? Acute renal failure superimposed on stage 3b chronic kidney disease ([place]) 11/16/2021  ? Cerebral thrombosis with cerebral infarction 10/10/2021  ? HTN (hypertension) 10/10/2021  ? Acute metabolic encephalopathy 47/15/9539  ? 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 Essential hypertension 08/17/2018  ? ? ?Bluma Buresh, Annye Rusk, CCC-SLP ?01/24/2022, 2:52 PM ? ?[facility] ?[place] ?[place], [place], [address] ?Phone: [phone]   Fax:  [phone] ? ? ?Name: Pansie Guggisberg ?MRN: [id number] ?Date of Birth: 07/30/1951 ? ?

## 2022-01-24 NOTE — Patient Instructions (Addendum)
? ?  Before you shop make a list ? ?Keep a daily journal of 3-4 activities or conversations that you do  each day - not in detail just a quick few words  ? ?For example, [employer] for butter ?                      Worked in the garage ?                       Made toast ?                       [place] talked about ..... ? ?Put the date and day on the top  ? ?Bring in your journal to review next session ? ?You can also add any current events or guests or topics on TV shows that you watch ? ?

## 2022-02-01 ENCOUNTER — Encounter: Payer: Medicare Other | Admitting: Speech Pathology

## 2022-02-02 ENCOUNTER — Telehealth: Payer: Self-pay

## 2022-02-02 NOTE — Telephone Encounter (Signed)
If it is behaving like it did when she had a stroke last December, I recommend she goes to the emergency department for evaluation.

## 2022-02-02 NOTE — Telephone Encounter (Signed)
THIS NOTE NOT NEEDED ?

## 2022-02-02 NOTE — Telephone Encounter (Signed)
[name] advise Darlene [name] pt daughter of Dr. Mitchel Honour recommendations to go to ER for evaluation and she said she will see if pt wants to go. I also told her we would keep current appt as HOS FU.

## 2022-02-02 NOTE — Telephone Encounter (Signed)
Pt daughter calling in to get advise on what she should do. Pt is complaining about a tingling sensation in her head and ear ringing. Pt states that she was feeling this tingling sensation  in her head on the Monday prior to the stroke in December on a Wednesday.  ? ?Daughter is wanting to know if she should take her to the ER or just wait and come see Dr. Mitchel Honour.on April 18.  ? ?Please advise Darlene [name] [phone] ?

## 2022-02-08 ENCOUNTER — Ambulatory Visit (INDEPENDENT_AMBULATORY_CARE_PROVIDER_SITE_OTHER): Payer: Medicare Other | Admitting: Emergency Medicine

## 2022-02-08 ENCOUNTER — Encounter: Payer: Medicare Other | Admitting: Speech Pathology

## 2022-02-08 ENCOUNTER — Encounter: Payer: Self-pay | Admitting: Emergency Medicine

## 2022-02-08 VITALS — BP 124/86 | HR 59 | Temp 98.8°F | Ht 65.5 in | Wt 192.4 lb

## 2022-02-08 DIAGNOSIS — Z8673 Personal history of transient ischemic attack (TIA), and cerebral infarction without residual deficits: Secondary | ICD-10-CM | POA: Diagnosis not present

## 2022-02-08 DIAGNOSIS — H9311 Tinnitus, right ear: Secondary | ICD-10-CM | POA: Diagnosis not present

## 2022-02-08 DIAGNOSIS — R202 Paresthesia of skin: Secondary | ICD-10-CM | POA: Diagnosis not present

## 2022-02-08 DIAGNOSIS — I639 Cerebral infarction, unspecified: Secondary | ICD-10-CM

## 2022-02-08 DIAGNOSIS — R7303 Prediabetes: Secondary | ICD-10-CM | POA: Diagnosis not present

## 2022-02-08 DIAGNOSIS — I10 Essential (primary) hypertension: Secondary | ICD-10-CM

## 2022-02-08 LAB — COMPREHENSIVE METABOLIC PANEL
ALT: 17 U/L (ref 0–35)
AST: 20 U/L (ref 0–37)
Albumin: 4.1 g/dL (ref 3.5–5.2)
Alkaline Phosphatase: 57 U/L (ref 39–117)
BUN: 29 mg/dL — ABNORMAL HIGH (ref 6–23)
CO2: 31 mEq/L (ref 19–32)
Calcium: 9.6 mg/dL (ref 8.4–10.5)
Chloride: 101 mEq/L (ref 96–112)
Creatinine, Ser: 1.49 mg/dL — ABNORMAL HIGH (ref 0.40–1.20)
GFR: 35.11 mL/min — ABNORMAL LOW (ref >60.00)
Glucose, Bld: 103 mg/dL — ABNORMAL HIGH (ref 70–99)
Potassium: 3.9 mEq/L (ref 3.5–5.1)
Sodium: 139 mEq/L (ref 135–145)
Total Bilirubin: 0.8 mg/dL (ref 0.2–1.2)
Total Protein: 7.6 g/dL (ref 6.0–8.3)

## 2022-02-08 LAB — CBC WITH DIFFERENTIAL/PLATELET
Basophils Absolute: 0 10*3/uL (ref 0.0–0.1)
Basophils Relative: 0.7 % (ref 0.0–3.0)
Eosinophils Absolute: 0.3 10*3/uL (ref 0.0–0.7)
Eosinophils Relative: 4.6 % (ref 0.0–5.0)
HCT: 37.3 % (ref 36.0–46.0)
Hemoglobin: 12.5 g/dL (ref 12.0–15.0)
Lymphocytes Relative: 39.4 % (ref 12.0–46.0)
Lymphs Abs: 2.2 10*3/uL (ref 0.7–4.0)
MCHC: 33.5 g/dL (ref 30.0–36.0)
MCV: 94.7 fl (ref 78.0–100.0)
Monocytes Absolute: 0.7 10*3/uL (ref 0.1–1.0)
Monocytes Relative: 11.7 % (ref 3.0–12.0)
Neutro Abs: 2.5 10*3/uL (ref 1.4–7.7)
Neutrophils Relative %: 43.6 % (ref 43.0–77.0)
Platelets: 178 10*3/uL (ref 150.0–400.0)
RBC: 3.94 Mil/uL (ref 3.87–5.11)
RDW: 14.2 % (ref 11.5–15.5)
WBC: 5.7 10*3/uL (ref 4.0–10.5)

## 2022-02-08 LAB — LIPID PANEL
Cholesterol: 141 mg/dL (ref 0–200)
HDL: 43.9 mg/dL (ref >39.00)
LDL Cholesterol: 85 mg/dL (ref 0–99)
NonHDL: 96.96
Total CHOL/HDL Ratio: 3
Triglycerides: 61 mg/dL (ref 0.0–149.0)
VLDL: 12.2 mg/dL (ref 0.0–40.0)

## 2022-02-08 LAB — VITAMIN B12: Vitamin B-12: 264 pg/mL (ref 211–911)

## 2022-02-08 LAB — HEMOGLOBIN A1C: Hgb A1c MFr Bld: 6.2 % (ref 4.6–6.5)

## 2022-02-08 NOTE — Assessment & Plan Note (Signed)
Well-controlled hypertension.  Continue Zestoretic 20-12.5 mg daily. ?BP Readings from Last 3 Encounters:  ?02/08/22 124/86  ?12/21/21 112/70  ?12/14/21 (!) 147/74  ? ? ?

## 2022-02-08 NOTE — Assessment & Plan Note (Signed)
Secondary prevention measures discussed with patient and son. ?

## 2022-02-08 NOTE — Patient Instructions (Signed)
Stop atorvastatin for 1 week to see if symptoms get better ?Continue blood pressure medication and daily baby aspirin ?Restart atorvastatin if ringing still present after short discontinuation ?Stroke Prevention ?Some medical conditions and lifestyle choices can lead to a higher risk for a stroke. You can help to prevent a stroke by eating healthy foods and exercising. It also helps to not smoke and to manage any health problems you may have. ?How can this condition affect me? ?A stroke is an emergency. It should be treated right away. A stroke can lead to brain damage or threaten your life. There is a better chance of surviving and getting better after a stroke if you get medical help right away. ?What can increase my risk? ?The following medical conditions may increase your risk of a stroke: ?Diseases of the heart and blood vessels (cardiovascular disease). ?High blood pressure (hypertension). ?Diabetes. ?High cholesterol. ?Sickle cell disease. ?Problems with blood clotting. ?Being very overweight. ?Sleeping problems (obstructivesleep apnea). ?Other risk factors include: ?Being older than age 55. ?A history of blood clots, stroke, or mini-stroke (TIA). ?Race, ethnic background, or a family history of stroke. ?Smoking or using tobacco products. ?Taking birth control pills, especially if you smoke. ?Heavy alcohol and drug use. ?Not being active. ?What actions can I take to prevent this? ?Manage your health conditions ?High cholesterol. ?Eat a healthy diet. If this is not enough to manage your cholesterol, you may need to take medicines. ?Take medicines as told by your doctor. ?High blood pressure. ?Try to keep your blood pressure below 130/80. ?If your blood pressure cannot be managed through a healthy diet and regular exercise, you may need to take medicines. ?Take medicines as told by your doctor. ?Ask your doctor if you should check your blood pressure at home. ?Have your blood pressure checked every  year. ?Diabetes. ?Eat a healthy diet and get regular exercise. If your blood sugar (glucose) cannot be managed through diet and exercise, you may need to take medicines. ?Take medicines as told by your doctor. ?Talk to your doctor about getting checked for sleeping problems. Signs of a problem can include: ?Snoring a lot. ?Feeling very tired. ?Make sure that you manage any other conditions you have. ?Nutrition ? ?Follow instructions from your doctor about what to eat or drink. You may be told to: ?Eat and drink fewer calories each day. ?Limit how much salt (sodium) you use to 1,500 milligrams (mg) each day. ?Use only healthy fats for cooking, such as olive oil, canola oil, and sunflower oil. ?Eat healthy foods. To do this: ?Choose foods that are high in fiber. These include whole grains, and fresh fruits and vegetables. ?Eat at least 5 servings of fruits and vegetables a day. Try to fill one-half of your plate with fruits and vegetables at each meal. ?Choose low-fat (lean) proteins. These include low-fat cuts of meat, chicken without skin, fish, tofu, beans, and nuts. ?Eat low-fat dairy products. ?Avoid foods that: ?Are high in salt. ?Have saturated fat. ?Have trans fat. ?Have cholesterol. ?Are processed or pre-made. ?Count how many carbohydrates you eat and drink each day. ?Lifestyle ?If you drink alcohol: ?Limit how much you have to: ?0-1 drink a day for women who are not pregnant. ?0-2 drinks a day for men. ?Know how much alcohol is in your drink. In the [place] one drink equals one 12 oz bottle of beer (376m), one 5 oz glass of wine (1414m, or one 1? oz glass of hard liquor (4470m ?Do not smoke or use any  products that have nicotine or tobacco. If you need help quitting, ask your doctor. ?Avoid secondhand smoke. ?Do not use drugs. ?Activity ? ?Try to stay at a healthy weight. ?Get at least 30 minutes of exercise on most days, such as: ?Fast walking. ?Biking. ?Swimming. ?Medicines ?Take over-the-counter and  prescription medicines only as told by your doctor. ?Avoid taking birth control pills. Talk to your doctor about the risks of taking birth control pills if: ?You are over 68 years old. ?You smoke. ?You get very bad headaches. ?You have had a blood clot. ?Where to find more information ?American Stroke Association: www.strokeassociation.org ?Get help right away if: ?You or a loved one has any signs of a stroke. "BE FAST" is an easy way to remember the warning signs: ?B - Balance. Dizziness, sudden trouble walking, or loss of balance. ?E - Eyes. Trouble seeing or a change in how you see. ?F - Face. Sudden weakness or loss of feeling of the face. The face or eyelid may droop on one side. ?A - Arms. Weakness or loss of feeling in an arm. This happens all of a sudden and most often on one side of the body. ?S - Speech. Sudden trouble speaking, slurred speech, or trouble understanding what people say. ?T - Time. Time to call emergency services. Write down what time symptoms started. ?You or a loved one has other signs of a stroke, such as: ?A sudden, very bad headache with no known cause. ?Feeling like you may vomit (nausea). ?Vomiting. ?A seizure. ?These symptoms may be an emergency. Get help right away. Call your local emergency services (911 in the [place] ?Do not wait to see if the symptoms will go away. ?Do not drive yourself to the hospital. ?Summary ?You can help to prevent a stroke by eating healthy, exercising, and not smoking. It also helps to manage any health problems you have. ?Do not smoke or use any products that contain nicotine or tobacco. ?Get help right away if you or a loved one has any signs of a stroke. ?This information is not intended to replace advice given to you by your health care provider. Make sure you discuss any questions you have with your health care provider. ?Document Revised: 05/11/2020 Document Reviewed: 05/11/2020 ?Elsevier Patient Education ? [place]. ? ?

## 2022-02-08 NOTE — Progress Notes (Signed)
Darlene [name] ?71 y.o. ? ? ?Chief Complaint  ?Patient presents with  ? Follow-up  ? Tinnitus  ?  Ringing in right ear, normally at night , patient noticed the ringing in her ears after she take her atorvastatin  ? Tingling  ?  Tingling sensation on top of head, comes and goes  ? ? ?HISTORY OF PRESENT ILLNESS: ?This is a 71 y.o. female complaining of ringing in right ear mostly at night since starting atorvastatin ?Also complaining of tingling sensation on top of the head which comes and goes for about a month. ?Patient had a stroke last December. ?History of hypertension presently on Zestoretic 20-12.5 mg ?Takes 1 baby aspirin daily along with atorvastatin 80 mg daily ?No balance issues.  Denies headaches or visual problems.  Able to walk without difficulty. ?No other complaints or medical concerns today. ? ?HPI ? ? ?Prior to Admission medications   ?Medication Sig Start Date End Date Taking? Authorizing Provider  ?aspirin 81 MG EC tablet Take 1 tablet (81 mg total) by mouth daily. Swallow whole. 12/22/21 03/22/22 Yes Swayzee Wadley, Ines Bloomer, MD  ?atorvastatin (LIPITOR) 80 MG tablet Take 1 tablet (80 mg total) by mouth daily. 12/22/21  Yes Darletta Noblett, Ines Bloomer, MD  ?lisinopril-hydrochlorothiazide (ZESTORETIC) 20-12.5 MG tablet Take 2 tablets by mouth daily. Please start on 10/13/2021 12/22/21  Yes Charlene Detter, Ines Bloomer, MD  ?LORazepam (ATIVAN) 1 MG tablet Take 1 mg tablet 1 hour before procedure. 01/13/22  Yes Horald Pollen, MD  ? ? ?No Known Allergies ? ?Patient Active Problem List  ? Diagnosis Date Noted  ? Tinnitus 12/24/2021  ? History of recent stroke 11/16/2021  ? Acute renal failure superimposed on stage 3b chronic kidney disease ([place]) 11/16/2021  ? Cerebral thrombosis with cerebral infarction 10/10/2021  ? HTN (hypertension) 10/10/2021  ? Acute metabolic encephalopathy 29/47/6546  ? 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 Essential hypertension 08/17/2018  ? ? ?Past Medical History:  ?Diagnosis Date  ?  Hypertension   ? ? ?No past surgical history on file. ? ?Social History  ? ?Socioeconomic History  ? Marital status: Married  ?  Spouse name: Not on file  ? Number of children: Not on file  ? Years of education: Not on file  ? Highest education level: Not on file  ?Occupational History  ? Not on file  ?Tobacco Use  ? Smoking status: Never  ? Smokeless tobacco: Never  ?Substance and Sexual Activity  ? Alcohol use: No  ? Drug use: No  ? Sexual activity: Never  ?Other Topics Concern  ? Not on file  ?Social History Narrative  ? Not on file  ? ?Social Determinants of Health  ? ?Financial Resource Strain: Not on file  ?Food Insecurity: Not on file  ?Transportation Needs: Not on file  ?Physical Activity: Not on file  ?Stress: Not on file  ?Social Connections: Not on file  ?Intimate Partner Violence: Not on file  ? ? ?Family History  ?Problem Relation Age of Onset  ? Diabetes Mother   ?     90s  ? Cancer Sister   ? ? ? ?Review of Systems  ?Constitutional: Negative.  Negative for chills and fever.  ?HENT: Negative.  Negative for congestion and sore throat.   ?Eyes: Negative.   ?Respiratory: Negative.  Negative for cough and shortness of breath.   ?Cardiovascular: Negative.  Negative for chest pain and palpitations.  ?Gastrointestinal: Negative.  Negative for abdominal pain, diarrhea, nausea and vomiting.  ?Genitourinary: Negative.   ?  Musculoskeletal: Negative.   ?Skin: Negative.  Negative for rash.  ?Neurological:  Positive for tingling. Negative for dizziness, sensory change, speech change, focal weakness, seizures, loss of consciousness and headaches.  ?All other systems reviewed and are negative. ? ?Today's Vitals  ? 02/08/22 1406  ?BP: 124/86  ?Pulse: (!) 59  ?Temp: 98.8 ?F (37.1 ?C)  ?SpO2: 93%  ?Weight: 192 lb 6 oz (87.3 kg)  ?Height: 5' 5.5" (1.664 m)  ? ?Body mass index is 31.53 kg/m?. ? ?Physical Exam ?Vitals reviewed.  ?Constitutional:   ?   Appearance: Normal appearance.  ?HENT:  ?   Head: Normocephalic.  ?    Right Ear: Tympanic membrane, ear canal and external ear normal.  ?   Left Ear: Tympanic membrane, ear canal and external ear normal.  ?   Mouth/Throat:  ?   Mouth: Mucous membranes are moist.  ?   Pharynx: Oropharynx is clear.  ?Eyes:  ?   Extraocular Movements: Extraocular movements intact.  ?   Conjunctiva/sclera: Conjunctivae normal.  ?   Pupils: Pupils are equal, round, and reactive to light.  ?Neck:  ?   Vascular: No carotid bruit.  ?Cardiovascular:  ?   Rate and Rhythm: Normal rate and regular rhythm.  ?   Pulses: Normal pulses.  ?   Heart sounds: Normal heart sounds.  ?Pulmonary:  ?   Effort: Pulmonary effort is normal.  ?   Breath sounds: Normal breath sounds.  ?Musculoskeletal:  ?   Cervical back: No tenderness.  ?Lymphadenopathy:  ?   Cervical: No cervical adenopathy.  ?Skin: ?   General: Skin is warm and dry.  ?Neurological:  ?   General: No focal deficit present.  ?   Mental Status: She is alert and oriented to person, place, and time.  ?   Cranial Nerves: No cranial nerve deficit.  ?   Sensory: No sensory deficit.  ?   Motor: No weakness.  ?   Coordination: Coordination normal.  ?   Gait: Gait normal.  ?Psychiatric:     ?   Mood and Affect: Mood normal.     ?   Behavior: Behavior normal.  ? ? ? ?ASSESSMENT & PLAN: ?Problem List Items Addressed This Visit   ? ?  ? Cardiovascular and Mediastinum  ? Essential hypertension  ?  Well-controlled hypertension.  Continue Zestoretic 20-12.5 mg daily. ?BP Readings from Last 3 Encounters:  ?02/08/22 124/86  ?12/21/21 112/70  ?12/14/21 (!) 147/74  ? ? ?  ?  ? Relevant Orders  ? CBC with Differential/Platelet  ? Comprehensive metabolic panel  ? Lipid panel  ? Hemoglobin A1c  ? Vitamin B12  ? Vitamin B6  ?  ? Other  ? History of recent stroke  ?  Secondary prevention measures discussed with patient and son. ? ?  ?  ? Relevant Orders  ? CBC with Differential/Platelet  ? Comprehensive metabolic panel  ? Lipid panel  ? Hemoglobin A1c  ? Vitamin B12  ? Vitamin B6  ?  Tinnitus - Primary  ?  Patient seems to think it is secondary to atorvastatin.  Could be aspirin related. ?It is safe to stop atorvastatin for 1 week and see if symptoms resolve.  Continue aspirin. ?If not, she should restart atorvastatin for stroke secondary prevention. ?Blood work done today to rule out other causes. ? ?  ?  ? Relevant Orders  ? CBC with Differential/Platelet  ? Comprehensive metabolic panel  ? Lipid panel  ? Hemoglobin A1c  ?  Vitamin B12  ? Vitamin B6  ? ?Other Visit Diagnoses   ? ? Tingling      ? Head  ? Relevant Orders  ? CBC with Differential/Platelet  ? Comprehensive metabolic panel  ? Lipid panel  ? Hemoglobin A1c  ? Vitamin B12  ? Vitamin B6  ? Prediabetes      ? Relevant Orders  ? Hemoglobin A1c  ? ?  ? ?Patient Instructions  ?Stop atorvastatin for 1 week to see if symptoms get better ?Continue blood pressure medication and daily baby aspirin ?Restart atorvastatin if ringing still present after short discontinuation ?Stroke Prevention ?Some medical conditions and lifestyle choices can lead to a higher risk for a stroke. You can help to prevent a stroke by eating healthy foods and exercising. It also helps to not smoke and to manage any health problems you may have. ?How can this condition affect me? ?A stroke is an emergency. It should be treated right away. A stroke can lead to brain damage or threaten your life. There is a better chance of surviving and getting better after a stroke if you get medical help right away. ?What can increase my risk? ?The following medical conditions may increase your risk of a stroke: ?Diseases of the heart and blood vessels (cardiovascular disease). ?High blood pressure (hypertension). ?Diabetes. ?High cholesterol. ?Sickle cell disease. ?Problems with blood clotting. ?Being very overweight. ?Sleeping problems (obstructivesleep apnea). ?Other risk factors include: ?Being older than age 55. ?A history of blood clots, stroke, or mini-stroke (TIA). ?Race,  ethnic background, or a family history of stroke. ?Smoking or using tobacco products. ?Taking birth control pills, especially if you smoke. ?Heavy alcohol and drug use. ?Not being active. ?What actions can I take

## 2022-02-08 NOTE — Assessment & Plan Note (Signed)
Patient seems to think it is secondary to atorvastatin.  Could be aspirin related. ?It is safe to stop atorvastatin for 1 week and see if symptoms resolve.  Continue aspirin. ?If not, she should restart atorvastatin for stroke secondary prevention. ?Blood work done today to rule out other causes. ?

## 2022-02-09 ENCOUNTER — Ambulatory Visit: Payer: Medicare Other | Admitting: Speech Pathology

## 2022-02-09 ENCOUNTER — Encounter: Payer: Self-pay | Admitting: Speech Pathology

## 2022-02-09 DIAGNOSIS — R41841 Cognitive communication deficit: Secondary | ICD-10-CM

## 2022-02-09 NOTE — Therapy (Signed)
[facility] ?[place], [place], [address] ?Phone: [phone]   Fax:  [phone] ? ?Speech Language Pathology Treatment ? ?Patient Details  ?Name: Darlene [name] ?MRN: [id number] ?Date of Birth: [birth year] ?Referring Provider (SLP): Frann Rider NP ? ? ?Encounter Date: 02/09/2022 ? ? End of Session - 02/09/22 1508   ? ? Visit Number 8   ? Number of Visits 25   ? Date for SLP Re-Evaluation 03/11/22   ? SLP Start Time [phone]   pt arrived late  ? SLP Stop Time  0930   ? SLP Time Calculation (min) 40 min   ? Activity Tolerance Patient tolerated treatment well   ? ?  ?  ? ?  ? ? ?Past Medical History:  ?Diagnosis Date  ? Hypertension   ? ? ?History reviewed. No pertinent surgical history. ? ?There were no vitals filed for this visit. ? ? Subjective Assessment - 02/09/22 1509   ? ? Subjective "The garage is neat"   ? Patient is accompained by: Family member   Jacklynn Ganong  ? Currently in Pain? No/denies   ? ?  ?  ? ?  ? ? ? ? ? ? ? ? ADULT SLP TREATMENT - 02/09/22 1510   ? ?  ? General Information  ? Behavior/Cognition Alert;Cooperative;Pleasant mood   ?  ? Treatment Provided  ? Treatment provided Cognitive-Linquistic   ?  ? Cognitive-Linquistic Treatment  ? Treatment focused on Cognition;Patient/family/caregiver education   ? Skilled Treatment Brenisha continues to increase household activities and more complex IADL's. Memory continues to be an issue. She requires mod A for emergent/anticiapatory awareness of memory impairment. She has not carried over use of memory notebook that she has been trained to use in [place]. Memory is partially responsible for this, however family continues to encourage her to use the memory notebook. Ongoing examples and instruction to write 3 main events/tasks each day. Natalyah continues to [employer] and shop with success. Today, she required usual mod A to attend to details on simple organization task. Targeted error awareness  in written expression task (word level) she required cues to ID 3 spelling errors out of 12 (3/12 incorrect) and mod A to correct them.   ?  ? Assessment / Recommendations / Plan  ? Plan Continue with current plan of care   ?  ? Progression Toward Goals  ? Progression toward goals Not progressing toward goals (comment)   not carrying over memory notebook  ? ?  ?  ? ?  ? ? ? SLP Education - 02/09/22 1519   ? ? Education Details memory book - 3 activities a day   ? Person(s) Educated Patient;Child(ren)   ? Methods Explanation;Demonstration;Verbal cues;Handout   ? Comprehension Verbalized understanding;Returned demonstration;Need further instruction;Verbal cues required   ? ?  ?  ? ?  ? ? ? SLP Short Term Goals - 02/09/22 1523   ? ?  ? SLP SHORT TERM GOAL #1  ? Title Pt will use calendar, crossing out, to be oriented & daily and weekly appointments and schedule with occasional min from family over 1 week   ? Time 2   ? Period Weeks   ? Status Achieved   ?  ? SLP SHORT TERM GOAL #2  ? Title Pt will use 1 organized password notebook to recall 4 of her most frequent passwords/websites with occasional min A over 3 sessions   ? Time 1   ? Period Weeks   ?  Status Partially Met   ?  ? SLP SHORT TERM GOAL #3  ? Title Pt will use timer to complete organization tasks to work 20-30 minutes with breaks to reduce fatigue and support attention/concenstration on 2 tasks over 1 week   ? Time 1   ? Period Weeks   ? Status Not Met   ?  ? SLP SHORT TERM GOAL #4  ? Title Pt will increase household participation in shopping, menu planning and light cooking with supervision from family   ? Time 2   ? Period Weeks   ? Status Achieved   ? ?  ?  ? ?  ? ? ? SLP Long Term Goals - 02/09/22 1524   ? ?  ? SLP LONG TERM GOAL #1  ? Title Pt will manage todo lists and schedule using external aids with rare min A over 1 weeks   ? Baseline 01/10/22; 01/24/22   ? Time 6   ? Period Weeks   ? Status Achieved   ?  ? SLP LONG TERM GOAL #2  ? Title Pt/family  will verbalize 3 strategies or accomodations for cognitive impairments for successful return to work as able   ? Time 6   ? Period Weeks   ? Status On-going   ?  ? SLP LONG TERM GOAL #3  ? Title Pt will complete counting $ and making change accruately with mild distractions (selective attention) and ID errors 4/5x with usual min A   ? Time 6   ? Period Weeks   ? Status On-going   ?  ? SLP LONG TERM GOAL #4  ? Title Pt will alternate attention between 2 mildly complex cognitive tasks, IDing errors with occasional min A over 2 sessions   ? Time 6   ? Period Weeks   ? Status Partially Met   ?  ? SLP LONG TERM GOAL #5  ? Title Pt will improve score of Cognition Function Short Form my 2 points   ? Baseline score of 28   ? Time 6   ? Period Weeks   ? Status On-going   ? ?  ?  ? ?  ? ? ? Plan - 02/09/22 1520   ? ? 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 She has carried over compensatory strategies for attention and memory to increase participation in house hold tasks includng cooking, shopping, decorating, and organizing. This is significant improvement from begining of ST when she was watching TV all day and letting her family manage her schedule and meds. She has not carried over memory notebook despite family cues and ST instructions over several sessions. She continues to have goal of returning to part time work at [employer] in some capacity. Continue skilled ST 1-2 more sessions to maximize cognition for safety, reduce caregiver burden and possibly return to work in the future   ? Speech Therapy Frequency 2x / week   ? Duration 12 weeks   ? Treatment/Interventions Environmental controls;Cueing hierarchy;Compensatory strategies;Functional tasks;Cognitive reorganization;Compensatory techniques;Patient/family education;Internal/external aids;SLP instruction and feedback   ? Potential to Achieve Goals Good   ? Potential Considerations Ability to  learn/carryover information   ? ?  ?  ? ?  ? ? ?Patient will benefit from skilled therapeutic intervention in order to improve the following deficits and impairments:   ?Cognitive communication deficit ? ? ? ?Problem List ?Patient Active Problem List  ? Diagnosis Date Noted  ? Tinnitus 12/24/2021  ?  History of recent stroke 11/16/2021  ? Acute renal failure superimposed on stage 3b chronic kidney disease ([place]) 11/16/2021  ? Cerebral thrombosis with cerebral infarction 10/10/2021  ? HTN (hypertension) 10/10/2021  ? Acute metabolic encephalopathy 74/09/8785  ? 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 Essential hypertension 08/17/2018  ? ? ?Tamia Dial, Annye Rusk, [place] ?02/09/2022, 3:25 PM ? ?[facility] ?[address] ?[place], [place], [address] ?Phone: [phone]   Fax:  [phone] ? ? ?Name: Jilliam Bellmore ?MRN: [id number] ?Date of Birth: 27-Feb-1951 ? ?

## 2022-02-14 ENCOUNTER — Ambulatory Visit: Payer: Medicare Other | Admitting: Emergency Medicine

## 2022-02-16 ENCOUNTER — Encounter: Payer: Self-pay | Admitting: Speech Pathology

## 2022-02-16 ENCOUNTER — Ambulatory Visit: Payer: Medicare Other | Admitting: Speech Pathology

## 2022-02-16 DIAGNOSIS — R41841 Cognitive communication deficit: Secondary | ICD-10-CM

## 2022-02-16 NOTE — Patient Instructions (Signed)
?  Tahjae, you  have done a great job - your brain will continue to heal in time ? ?Great job practicing driving short distances with family ? ?Keep practicing counting money and making change - find an app to give you money word problems ? ?Get a ride to go back to church - mentally prepare for people being excited to see you and ask you lots of questions about how you are doing. Have some answers prepared before you go ? ?Start reviewing church members names and their family names - use your sister to help you recall names  ? ? ?

## 2022-02-16 NOTE — Therapy (Signed)
[facility] ?[address] ?[place], [place], [address] ?Phone: [phone]   Fax:  [phone] ? ?Speech Language Pathology Treatment & Discharge Summary ? ?Patient Details  ?Name: Darlene [name] ?MRN: [id number] ?Date of Birth: April 18, 1951 ?Referring Provider (SLP): Frann Rider NP ? ? ?Encounter Date: 02/16/2022 ? ? End of Session - 02/16/22 1048   ? ? Visit Number 9   ? Number of Visits 25   ? Date for SLP Re-Evaluation 03/11/22   ? SLP Start Time 0900   pt arrived late  ? SLP Stop Time  0930   ? SLP Time Calculation (min) 30 min   ? Activity Tolerance Patient tolerated treatment well   ? ?  ?  ? ?  ? ? ?Past Medical History:  ?Diagnosis Date  ? Hypertension   ? ? ?History reviewed. No pertinent surgical history. ? ?There were no vitals filed for this visit. ? ? Subjective Assessment - 02/16/22 0908   ? ? Subjective "My sister has cancer"   ? Patient is accompained by: Family member   Darlene [name]  ? Currently in Pain? No/denies   ? ?  ?  ? ?  ? ? ? ? ? ? ? ? ADULT SLP TREATMENT - 02/16/22 0911   ? ?  ? General Information  ? Behavior/Cognition Alert;Cooperative;Pleasant mood   ?  ? Treatment Provided  ? Treatment provided Cognitive-Linquistic   ?  ? Cognitive-Linquistic Treatment  ? Treatment focused on Cognition;Patient/family/caregiver education   ? Skilled Treatment [name] returns with calendar filled out with 2 activities she did each day. She used the calendar to share 3 details about her week and what she has been working on at home. She has practiced driving with her spouse in the car short distances. She has been counting money successfully daily to practice for return to work. She would like to return to church, however it is far from her house. She generated solution of askisng her spouse to drive her. [name] generated 3 accommodations she can request should she return to work. She continues to pay bills successfully and has changed some  online bills to use her thumbprint to avoid having to manage passwords.   ?  ? Assessment / Recommendations / Plan  ? Plan Discharge SLP treatment due to (comment)   ?  ? Progression Toward Goals  ? Progression toward goals Goals met, education completed, patient discharged from SLP   ? ?  ?  ? ?  ? ? ? SLP Education - 02/16/22 1050   ? ? Education Details continue writing in calendar, counting money and making change   ? Person(s) Educated Patient;Child(ren)   ? Methods Explanation;Demonstration;Verbal cues;Handout   ? Comprehension Returned demonstration;Verbalized understanding   ? ?  ?  ? ?  ? ? ?SPEECH THERAPY DISCHARGE SUMMARY ? ?Visits from Start of Care: 9 ? ?Current functional level related to goals / functional outcomes: ?See goals below ?  ?Remaining deficits: ?Attention, memory ?  ?Education / Equipment: ?Compensations for cognitive impairments, accommodations should she return to work, cognitive activities to do at home  ? ?Patient agrees to discharge. Patient goals were met. Patient is being discharged due to meeting the stated rehab goals.. ? ? ? ? ? SLP Short Term Goals - 02/16/22 1051   ? ?  ? SLP SHORT TERM GOAL #1  ? Title Pt will use calendar, crossing out, to be oriented & daily and weekly appointments and schedule with occasional min  from family over 1 week   ? Time 2   ? Period Weeks   ? Status Achieved   ?  ? SLP SHORT TERM GOAL #2  ? Title Pt will use 1 organized password notebook to recall 4 of her most frequent passwords/websites with occasional min A over 3 sessions   ? Time 1   ? Period Weeks   ? Status Partially Met   ?  ? SLP SHORT TERM GOAL #3  ? Title Pt will use timer to complete organization tasks to work 20-30 minutes with breaks to reduce fatigue and support attention/concenstration on 2 tasks over 1 week   ? Time 1   ? Period Weeks   ? Status Not Met   ?  ? SLP SHORT TERM GOAL #4  ? Title Pt will increase household participation in shopping, menu planning and light cooking with  supervision from family   ? Time 2   ? Period Weeks   ? Status Achieved   ? ?  ?  ? ?  ? ? ? SLP Long Term Goals - 02/16/22 1052   ? ?  ? SLP LONG TERM GOAL #1  ? Title Pt will manage todo lists and schedule using external aids with rare min A over 1 weeks   ? Baseline 01/10/22; 01/24/22   ? Time 6   ? Period Weeks   ? Status Achieved   ?  ? SLP LONG TERM GOAL #2  ? Title Pt/family will verbalize 3 strategies or accomodations for cognitive impairments for successful return to work as able   ? Time 6   ? Period Weeks   ? Status Achieved   ?  ? SLP LONG TERM GOAL #3  ? Title Pt will complete counting $ and making change accruately with mild distractions (selective attention) and ID errors 4/5x with usual min A   ? Time 6   ? Period Weeks   ? Status Achieved   ?  ? SLP LONG TERM GOAL #4  ? Title Pt will alternate attention between 2 mildly complex cognitive tasks, IDing errors with occasional min A over 2 sessions   ? Time 6   ? Period Weeks   ? Status Partially Met   ?  ? SLP LONG TERM GOAL #5  ? Title Pt will improve score of Cognition Function Short Form my 2 points   ? Baseline score of 28   ? Time 6   ? Period Weeks   ? Status Achieved   ? ?  ?  ? ?  ? ? ? Plan - 02/16/22 1050   ? ? 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 She has carried over compensatory strategies for attention and memory to increase participation in house hold tasks includng cooking, shopping, decorating, and organizing. This is significant improvement from begining of ST when she was watching TV all day and letting her family manage her schedule and meds. She has not carried over memory notebook despite family cues and ST instructions over several sessions. She continues to have goal of returning to part time work at [employer] in some capacity.She is carryingover memory calendar to recall pertinent daily events. Memory continues to be impaired and [name] verbalizes good  awareness of her attention and memory impairments.  Goals met., D/c ST- pt and family in agreement   ? Speech Therapy Frequency 2x / week   ? Duration 12 weeks   ?  Treatment/Interventions Environmental controls;Cueing hierarchy;Compensatory strategies;Functional tasks;Cognitive reorganization;Compensatory techniques;Patient/family education;Internal/external aids;SLP instruction and feedback   ? Potential to Achieve Goals Good   ? Potential Considerations Ability to learn/carryover information   ? ?  ?  ? ?  ? ? ?Patient will benefit from skilled therapeutic intervention in order to improve the following deficits and impairments:   ?Cognitive communication deficit ? ? ? ?Problem List ?Patient Active Problem List  ? Diagnosis Date Noted  ? Tinnitus 12/24/2021  ? History of recent stroke 11/16/2021  ? Acute renal failure superimposed on stage 3b chronic kidney disease ([place]) 11/16/2021  ? Cerebral thrombosis with cerebral infarction 10/10/2021  ? HTN (hypertension) 10/10/2021  ? Acute metabolic encephalopathy 60/47/9987  ? 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 Essential hypertension 08/17/2018  ? ? ?Darlene [name], Darlene [name], Darlene [name] ?02/16/2022, 10:53 AM ? ?[facility] ?[address] ?[place], [place], [address] ?Phone: [phone]   Fax:  [phone] ? ? ?Name: Darlene [name] ?MRN: [id number] ?Date of Birth: Dec 15, 1950 ? ?

## 2022-03-23 ENCOUNTER — Encounter: Payer: Self-pay | Admitting: Emergency Medicine

## 2022-03-23 NOTE — Telephone Encounter (Signed)
No need for office visit.

## 2022-04-03 ENCOUNTER — Other Ambulatory Visit: Payer: Self-pay | Admitting: Emergency Medicine

## 2022-04-03 DIAGNOSIS — I10 Essential (primary) hypertension: Secondary | ICD-10-CM

## 2022-04-04 MED ORDER — LISINOPRIL-HYDROCHLOROTHIAZIDE 20-12.5 MG PO TABS: 2.0000 | ORAL_TABLET | Freq: Every day | ORAL | 0 refills | Status: DC

## 2022-04-04 MED ORDER — ATORVASTATIN CALCIUM 80 MG PO TABS: 80.0000 mg | ORAL_TABLET | Freq: Every day | ORAL | 0 refills | Status: DC

## 2022-04-13 ENCOUNTER — Ambulatory Visit: Payer: Medicare Other | Admitting: Adult Health

## 2022-04-22 ENCOUNTER — Encounter: Payer: Self-pay | Admitting: Cardiology

## 2022-04-25 NOTE — Telephone Encounter (Signed)
I spoke with the patient daughter and she states we can put the app on the patient cell phone. I told her the patient can call us or come to the office to get help setting up the phone app. Darlene [name] verbalized understanding and thanked me for my help.

## 2022-04-27 ENCOUNTER — Telehealth: Payer: Self-pay | Admitting: Cardiology

## 2022-04-27 NOTE — Telephone Encounter (Signed)
Darlene [name], Cardiology. Patient woke up this morning and noticed that her arms are tingly feeling and almost numb. She said it is going on in both arms. This is the first time she experienced this since her defibrillator was put in. She is not having any other symptoms. Please advise  I called patient's Son, Mr. Lorrine Kin; Pt did not answer her telephone.  Mr. Warzecha stated his mother woke up this morning with tingling in her right left arms, and numbness in her hands.  Mr. Angelucci then stated her right arm is completely back to normal with no numbness or tingling.    Mr. Gardenhire stated the left arm symptoms were now only a "2."    I asked if the patient had any SOB, Chest pain, palpitations, back pain, or any other symptoms, and Mr. Mago stated she didn't.   I also asked if the patient had any mental changes overnight, change in speech, visual changes, thought pattern changes, or if she was at her normal cognitive status this morning.  Mr. Yellin stated she was, and no changes in her cognitive status were present.    I told Mr Humphres to continue to monitor her left arm / hand, as her complaints were still a 2;  However, Mr Mcginness was instructed to take mother to ER if symptoms did not resolve within the next 30- 45 minutes, using her left hand and arm; A providers assessment may be required.  Mr. Lakatos understood and agreed with plan.

## 2022-04-27 NOTE — Telephone Encounter (Signed)
Patient woke up this morning and noticed that her arms are tingly feeling and almost numb. She said it is going on in both arms. This is the first time she experienced this since her defibrillator was put in. She is not having any other symptoms. Please advise

## 2022-05-02 ENCOUNTER — Telehealth: Payer: Self-pay

## 2022-05-02 NOTE — Telephone Encounter (Signed)
LVM for patient to call device clinic back   LINQ alert received. 10 episodes of atrial fibrillation with RVR occurred 04/29/22. Total time ~1.5 hrs. Median ventricular rate 120 138 bpm. Not on [place]. Sent to Triage for review. Follow up as scheduled. LKV  Needs apt wit AF clinic to discuss [facility]

## 2022-05-02 NOTE — Telephone Encounter (Signed)
Spoke with patient informed her the rhythm AF and it could be the cause of the stroke, patient agreeable to at with AF clinic tomorrow at 8:30 number to AF clinic given instructed patient to call this number for directions

## 2022-05-03 ENCOUNTER — Ambulatory Visit (HOSPITAL_COMMUNITY)
Admission: RE | Admit: 2022-05-03 | Discharge: 2022-05-03 | Disposition: A | Discharge status: Home / Self Care | Payer: Medicare Other | Source: Ambulatory Visit | Attending: Physician Assistant | Admitting: Physician Assistant

## 2022-05-03 ENCOUNTER — Encounter (HOSPITAL_COMMUNITY): Payer: Self-pay | Admitting: Physician Assistant

## 2022-05-03 VITALS — BP 134/78 | HR 47 | Ht 65.5 in | Wt 198.4 lb

## 2022-05-03 DIAGNOSIS — Z79899 Other long term (current) drug therapy: Secondary | ICD-10-CM | POA: Diagnosis not present

## 2022-05-03 DIAGNOSIS — Z8673 Personal history of transient ischemic attack (TIA), and cerebral infarction without residual deficits: Secondary | ICD-10-CM | POA: Diagnosis not present

## 2022-05-03 DIAGNOSIS — D6869 Other thrombophilia: Secondary | ICD-10-CM | POA: Insufficient documentation

## 2022-05-03 DIAGNOSIS — I10 Essential (primary) hypertension: Secondary | ICD-10-CM | POA: Insufficient documentation

## 2022-05-03 DIAGNOSIS — R001 Bradycardia, unspecified: Secondary | ICD-10-CM | POA: Insufficient documentation

## 2022-05-03 DIAGNOSIS — E669 Obesity, unspecified: Secondary | ICD-10-CM | POA: Insufficient documentation

## 2022-05-03 DIAGNOSIS — I480 Paroxysmal atrial fibrillation: Secondary | ICD-10-CM | POA: Diagnosis not present

## 2022-05-03 DIAGNOSIS — Z7901 Long term (current) use of anticoagulants: Secondary | ICD-10-CM | POA: Insufficient documentation

## 2022-05-03 DIAGNOSIS — Z95818 Presence of other cardiac implants and grafts: Secondary | ICD-10-CM | POA: Diagnosis not present

## 2022-05-03 DIAGNOSIS — Z7182 Exercise counseling: Secondary | ICD-10-CM | POA: Insufficient documentation

## 2022-05-03 DIAGNOSIS — Z6832 Body mass index (BMI) 32.0-32.9, adult: Secondary | ICD-10-CM | POA: Diagnosis not present

## 2022-05-03 MED ORDER — APIXABAN 5 MG PO TABS: 5.0000 mg | ORAL_TABLET | Freq: Two times a day (BID) | ORAL | 6 refills | Status: AC

## 2022-05-03 NOTE — Patient Instructions (Signed)
Start Eliquis 5mg - Taking one tablet by mouth twice daily  

## 2022-05-03 NOTE — Progress Notes (Signed)
Primary Care Physician: Horald Pollen, MD Primary Electrophysiologist: Dr Curt Bears Referring Physician: Dr Melida Gimenez Darlene [name] is a 71 y.o. female with a history of HTN, CVA, atrial fibrillation who presents for follow up in the [facility].  The patient was initially diagnosed with atrial fibrillation 04/29/22 on ILR which had been placed for cryptogenic stroke. ILR showed 10 episodes of afib, total time 1.5 hours. Patient has a CHADS2VASC score of 5. Patient does report having some tachypalpitations at that time. No specific triggers that she could identify.   Today, she denies symptoms of chest pain, shortness of breath, orthopnea, PND, lower extremity edema, dizziness, presyncope, syncope, snoring, daytime somnolence, bleeding, or neurologic sequela. The patient is tolerating medications without difficulties and is otherwise without complaint today.    Atrial Fibrillation Risk Factors:  she does not have symptoms or diagnosis of sleep apnea. she does not have a history of rheumatic fever. she does not have a history of alcohol use. The patient does not have a history of early familial atrial fibrillation or other arrhythmias.  she has a BMI of Body mass index is 32.51 kg/m.Marland Kitchen Filed Weights   05/03/22 0838  Weight: 90 kg    Family History  Problem Relation Age of Onset   Diabetes Mother        46s   Cancer Sister      Atrial Fibrillation Management history:  Previous antiarrhythmic drugs: none Previous cardioversions: none Previous ablations: none CHADS2VASC score: 5 Anticoagulation history: none   Past Medical History:  Diagnosis Date   Hypertension    No past surgical history on file.  Current Outpatient Medications  Medication Sig Dispense Refill   apixaban (ELIQUIS) 5 MG TABS tablet Take 1 tablet (5 mg total) by mouth 2 (two) times daily. 60 tablet 6   atorvastatin (LIPITOR) 80 MG tablet Take 1 tablet (80 mg total) by  mouth daily. 90 tablet 0   lisinopril-hydrochlorothiazide (ZESTORETIC) 20-12.5 MG tablet Take 2 tablets by mouth daily. Please start on 10/13/2021 180 tablet 0   No current facility-administered medications for this encounter.    No Known Allergies  Social History   Socioeconomic History   Marital status: Married    Spouse name: Not on file   Number of children: Not on file   Years of education: Not on file   Highest education level: Not on file  Occupational History   Not on file  Tobacco Use   Smoking status: Never   Smokeless tobacco: Never   Tobacco comments:    Never smoke 05/03/22  Substance and Sexual Activity   Alcohol use: No   Drug use: No   Sexual activity: Never  Other Topics Concern   Not on file  Social History Narrative   Not on file   Social Determinants of Health   Financial Resource Strain: Not on file  Food Insecurity: Not on file  Transportation Needs: Not on file  Physical Activity: Not on file  Stress: Not on file  Social Connections: Not on file  Intimate Partner Violence: Not on file     ROS- All systems are reviewed and negative except as per the HPI above.  Physical Exam: Vitals:   05/03/22 0838  BP: 134/78  Pulse: (!) 47  Weight: 90 kg  Height: 5' 5.5" (1.664 m)    GEN- The patient is a well appearing obese female, alert and oriented x 3 today.   Head- normocephalic, atraumatic Eyes-  Sclera clear, conjunctiva pink Ears- hearing intact Oropharynx- clear Neck- supple  Lungs- Clear to ausculation bilaterally, normal work of breathing Heart- Regular rate and rhythm, bradycardia, no murmurs, rubs or gallops  GI- soft, NT, ND, + BS Extremities- no clubbing, cyanosis, or edema MS- no significant deformity or atrophy Skin- no rash or lesion Psych- euthymic mood, full affect Neuro- strength and sensation are intact  Wt Readings from Last 3 Encounters:  05/03/22 90 kg  02/08/22 87.3 kg  12/21/21 86.2 kg    EKG today  demonstrates  SB, NST Vent. rate 47 BPM PR interval 136 ms QRS duration 76 ms QT/QTcB 436/385 ms  Echo 10/10/21 demonstrated   1. Left ventricular ejection fraction, by estimation, is 65 to 70%. Left  ventricular ejection fraction by 3D volume is 65 %. The left ventricle has normal function. The left ventricle has no regional wall motion  abnormalities. There is severe hypertrophy of the septum. The rest of the LV segments demonstrate moderate left ventricular hypertrophy. Left ventricular diastolic parameters are consistent with Grade I diastolic dysfunction (impaired relaxation).   2. Right ventricular systolic function is normal. The right ventricular  size is normal. There is mildly elevated pulmonary artery systolic  pressure. The estimated right ventricular systolic pressure is 12.2 mmHg.   3. Left atrial size was severely dilated.   4. The mitral valve is normal in structure. Mild mitral valve  regurgitation.   5. The aortic valve is tricuspid. There is mild calcification of the  aortic valve. There is mild thickening of the aortic valve. Aortic valve  regurgitation is not visualized. Aortic valve sclerosis is present, with  no evidence of aortic valve stenosis.   6. The inferior vena cava is normal in size with <50% respiratory  variability, suggesting right atrial pressure of 8 mmHg.   Comparison(s): No prior Echocardiogram.   Conclusion(s)/Recommendation(s): No intracardiac source of embolism detected on this transthoracic study. Consider a transesophageal echocardiogram to exclude cardiac source of embolism if clinically indicated.   Epic records are reviewed at length today  CHA2DS2-VASc Score = 5  The patient's score is based upon: CHF History: 0 HTN History: 1 Diabetes History: 0 Stroke History: 2 Vascular Disease History: 0 Age Score: 1 Gender Score: 1       ASSESSMENT AND PLAN: 1. Paroxysmal Atrial Fibrillation (ICD10:  I48.0) The patient's CHA2DS2-VASc  score is 5, indicating a 7.2% annual risk of stroke.   General education about afib provided and questions answered. We also discussed her stroke risk and the risks and benefits of anticoagulation. Start Eliquis 5 mg BID. Recent lab work reviewed.  Continue to track afib burden on ILR.  2. Secondary Hypercoagulable State (ICD10:  D68.69) The patient is at significant risk for stroke/thromboembolism based upon her CHA2DS2-VASc Score of 5.  Start Apixaban (Eliquis).   3. Obesity Body mass index is 32.51 kg/m. Lifestyle modification was discussed at length including regular exercise and weight reduction.  4. HTN Stable, no changes today.  5. Bradycardia Not on any AV nodal agents Asymptomatic    Follow up in the AF clinic in one month.    [facility] [address] [place], [place] [address] [phone] 05/03/2022 9:05 AM

## 2022-05-12 LAB — CUP PACEART REMOTE DEVICE CHECK
Date Time Interrogation Session: 20230718091349
Implantable Pulse Generator Implant Date: 20230131

## 2022-05-16 ENCOUNTER — Ambulatory Visit: Payer: Medicare Other | Admitting: Emergency Medicine

## 2022-05-16 ENCOUNTER — Ambulatory Visit (INDEPENDENT_AMBULATORY_CARE_PROVIDER_SITE_OTHER): Payer: Medicare Other

## 2022-05-16 DIAGNOSIS — I639 Cerebral infarction, unspecified: Secondary | ICD-10-CM | POA: Diagnosis not present

## 2022-05-23 ENCOUNTER — Encounter: Payer: Self-pay | Admitting: Cardiology

## 2022-05-31 ENCOUNTER — Ambulatory Visit (HOSPITAL_COMMUNITY): Payer: Medicare Other | Admitting: Physician Assistant

## 2022-06-06 ENCOUNTER — Ambulatory Visit (HOSPITAL_COMMUNITY)
Admission: RE | Admit: 2022-06-06 | Discharge: 2022-06-06 | Disposition: A | Discharge status: Home / Self Care | Payer: Medicare Other | Source: Ambulatory Visit | Attending: Physician Assistant | Admitting: Physician Assistant

## 2022-06-06 VITALS — BP 134/82 | HR 46 | Ht 65.5 in | Wt 198.0 lb

## 2022-06-06 DIAGNOSIS — Z6832 Body mass index (BMI) 32.0-32.9, adult: Secondary | ICD-10-CM | POA: Insufficient documentation

## 2022-06-06 DIAGNOSIS — I480 Paroxysmal atrial fibrillation: Secondary | ICD-10-CM | POA: Insufficient documentation

## 2022-06-06 DIAGNOSIS — E669 Obesity, unspecified: Secondary | ICD-10-CM | POA: Diagnosis not present

## 2022-06-06 DIAGNOSIS — Z7901 Long term (current) use of anticoagulants: Secondary | ICD-10-CM | POA: Insufficient documentation

## 2022-06-06 DIAGNOSIS — I10 Essential (primary) hypertension: Secondary | ICD-10-CM | POA: Diagnosis not present

## 2022-06-06 DIAGNOSIS — Z8673 Personal history of transient ischemic attack (TIA), and cerebral infarction without residual deficits: Secondary | ICD-10-CM | POA: Insufficient documentation

## 2022-06-06 DIAGNOSIS — D6869 Other thrombophilia: Secondary | ICD-10-CM | POA: Diagnosis not present

## 2022-06-06 DIAGNOSIS — R001 Bradycardia, unspecified: Secondary | ICD-10-CM | POA: Diagnosis not present

## 2022-06-06 NOTE — Progress Notes (Signed)
Primary Care Physician: Darlene Pollen, MD Primary Electrophysiologist: Dr Curt Bears Referring Physician: Dr [name] is a 71 y.o. female with a history of HTN, CVA, atrial fibrillation who presents for follow up in the [facility].  The patient was initially diagnosed with atrial fibrillation 04/29/22 on ILR which had been placed for cryptogenic stroke. ILR showed 10 episodes of afib, total time 1.5 hours. Patient has a CHADS2VASC score of 5. Patient does report having some tachypalpitations at that time. No specific triggers that she could identify. She was started on Eliquis 05/03/22.  On follow up today, patient reports that she has felt well since her last visit. ILR shows 0.1% afib burden with another episode occurring on 7/18, none since. She did not start Eliquis due to concern about potential side effects (bleeding, increased thrombotic risk if she stopped).   Today, she denies symptoms of palpitations, chest pain, shortness of breath, orthopnea, PND, lower extremity edema, dizziness, presyncope, syncope, snoring, daytime somnolence, bleeding, or neurologic sequela. The patient is tolerating medications without difficulties and is otherwise without complaint today.    Atrial Fibrillation Risk Factors:  she does not have symptoms or diagnosis of sleep apnea. she does not have a history of rheumatic fever. she does not have a history of alcohol use. The patient does not have a history of early familial atrial fibrillation or other arrhythmias.  she has a BMI of Body mass index is 32.45 kg/m.Marland Kitchen Filed Weights   06/06/22 1029  Weight: 89.8 kg    Family History  Problem Relation Age of Onset   Diabetes Mother        43s   Cancer Sister      Atrial Fibrillation Management history:  Previous antiarrhythmic drugs: none Previous cardioversions: none Previous ablations: none CHADS2VASC score: 5 Anticoagulation history:  Eliquis   Past Medical History:  Diagnosis Date   Hypertension    No past surgical history on file.  Current Outpatient Medications  Medication Sig Dispense Refill   atorvastatin (LIPITOR) 80 MG tablet Take 1 tablet (80 mg total) by mouth daily. 90 tablet 0   lisinopril-hydrochlorothiazide (ZESTORETIC) 20-12.5 MG tablet Take 2 tablets by mouth daily. Please start on 10/13/2021 180 tablet 0   apixaban (ELIQUIS) 5 MG TABS tablet Take 1 tablet (5 mg total) by mouth 2 (two) times daily. (Patient not taking: Reported on 06/06/2022) 60 tablet 6   No current facility-administered medications for this encounter.    No Known Allergies  Social History   Socioeconomic History   Marital status: Married    Spouse name: Not on file   Number of children: Not on file   Years of education: Not on file   Highest education level: Not on file  Occupational History   Not on file  Tobacco Use   Smoking status: Never   Smokeless tobacco: Never   Tobacco comments:    Never smoke 05/03/22  Substance and Sexual Activity   Alcohol use: No   Drug use: No   Sexual activity: Never  Other Topics Concern   Not on file  Social History Narrative   Not on file   Social Determinants of Health   Financial Resource Strain: Not on file  Food Insecurity: Not on file  Transportation Needs: Not on file  Physical Activity: Not on file  Stress: Not on file  Social Connections: Not on file  Intimate Partner Violence: Not on file     ROS-  All systems are reviewed and negative except as per the HPI above.  Physical Exam: Vitals:   06/06/22 1029  BP: 134/82  Pulse: (!) 46  Weight: 89.8 kg  Height: 5' 5.5" (1.664 m)     GEN- The patient is a well appearing obese female, alert and oriented x 3 today.   HEENT-head normocephalic, atraumatic, sclera clear, conjunctiva pink, hearing intact, trachea midline. Lungs- Clear to ausculation bilaterally, normal work of breathing Heart- Regular rate and  rhythm, no murmurs, rubs or gallops  GI- soft, NT, ND, + BS Extremities- no clubbing, cyanosis, or edema MS- no significant deformity or atrophy Skin- no rash or lesion Psych- euthymic mood, full affect Neuro- strength and sensation are intact   Wt Readings from Last 3 Encounters:  06/06/22 89.8 kg  05/03/22 90 kg  02/08/22 87.3 kg    EKG today demonstrates  SB, NST Vent. rate 46 BPM PR interval 136 ms QRS duration 76 ms QT/QTcB 456/399 ms  Echo 10/10/21 demonstrated   1. Left ventricular ejection fraction, by estimation, is 65 to 70%. Left  ventricular ejection fraction by 3D volume is 65 %. The left ventricle has normal function. The left ventricle has no regional wall motion  abnormalities. There is severe hypertrophy of the septum. The rest of the LV segments demonstrate moderate left ventricular hypertrophy. Left ventricular diastolic parameters are consistent with Grade I diastolic dysfunction (impaired relaxation).   2. Right ventricular systolic function is normal. The right ventricular  size is normal. There is mildly elevated pulmonary artery systolic  pressure. The estimated right ventricular systolic pressure is 11.9 mmHg.   3. Left atrial size was severely dilated.   4. The mitral valve is normal in structure. Mild mitral valve  regurgitation.   5. The aortic valve is tricuspid. There is mild calcification of the  aortic valve. There is mild thickening of the aortic valve. Aortic valve  regurgitation is not visualized. Aortic valve sclerosis is present, with  no evidence of aortic valve stenosis.   6. The inferior vena cava is normal in size with <50% respiratory  variability, suggesting right atrial pressure of 8 mmHg.   Comparison(s): No prior Echocardiogram.   Conclusion(s)/Recommendation(s): No intracardiac source of embolism detected on this transthoracic study. Consider a transesophageal echocardiogram to exclude cardiac source of embolism if clinically  indicated.   Epic records are reviewed at length today  CHA2DS2-VASc Score = 5  The patient's score is based upon: CHF History: 0 HTN History: 1 Diabetes History: 0 Stroke History: 2 Vascular Disease History: 0 Age Score: 1 Gender Score: 1       ASSESSMENT AND PLAN: 1. Paroxysmal Atrial Fibrillation (ICD10:  I48.0) The patient's CHA2DS2-VASc score is 5, indicating a 7.2% annual risk of stroke.   ILR shows 0.1% afib burden. We again discussed her stroke risk and the risks and benefits of anticoagulation. Concerns addressed.  Patient agreeable to starting Eliquis 5 mg BID. Will check bmet/cbc in one month.   2. Secondary Hypercoagulable State (ICD10:  D68.69) The patient is at significant risk for stroke/thromboembolism based upon her CHA2DS2-VASc Score of 5.  Start Apixaban (Eliquis).   3. Obesity Body mass index is 32.45 kg/m. Lifestyle modification was discussed and encouraged including regular physical activity and weight reduction.  4. HTN Stable, no changes today.  5. Bradycardia Not on any AV nodal agents Asymptomatic.   Follow up in the AF clinic in 2 months.    [place]  [facility] [address] [place], [place] [address] [phone] 06/06/2022 1:15 PM

## 2022-06-16 NOTE — Progress Notes (Signed)
[facility] Summary Report / Loop Recorder 

## 2022-06-19 LAB — CUP PACEART REMOTE DEVICE CHECK
Date Time Interrogation Session: 20230820091820
Implantable Pulse Generator Implant Date: 20230131

## 2022-06-20 ENCOUNTER — Ambulatory Visit (INDEPENDENT_AMBULATORY_CARE_PROVIDER_SITE_OTHER): Payer: Medicare Other

## 2022-06-20 DIAGNOSIS — I480 Paroxysmal atrial fibrillation: Secondary | ICD-10-CM | POA: Diagnosis not present

## 2022-06-28 ENCOUNTER — Other Ambulatory Visit (HOSPITAL_COMMUNITY): Payer: Medicare Other | Admitting: Physician Assistant

## 2022-07-06 ENCOUNTER — Ambulatory Visit (HOSPITAL_COMMUNITY): Payer: Medicare Other | Admitting: Physician Assistant

## 2022-07-13 NOTE — Progress Notes (Signed)
[facility] Summary Report / Loop Recorder 

## 2022-07-15 ENCOUNTER — Ambulatory Visit (INDEPENDENT_AMBULATORY_CARE_PROVIDER_SITE_OTHER): Payer: Medicare Other

## 2022-07-15 DIAGNOSIS — I639 Cerebral infarction, unspecified: Secondary | ICD-10-CM

## 2022-07-19 LAB — CUP PACEART REMOTE DEVICE CHECK
Date Time Interrogation Session: 20230922091545
Implantable Pulse Generator Implant Date: 20230131

## 2022-07-22 NOTE — Progress Notes (Signed)
[facility] Summary Report / Loop Recorder 

## 2022-08-08 ENCOUNTER — Encounter (HOSPITAL_COMMUNITY): Payer: Self-pay

## 2022-08-08 ENCOUNTER — Ambulatory Visit (HOSPITAL_COMMUNITY): Payer: Medicare Other | Admitting: Physician Assistant

## 2022-08-17 ENCOUNTER — Ambulatory Visit (INDEPENDENT_AMBULATORY_CARE_PROVIDER_SITE_OTHER): Payer: Medicare Other

## 2022-08-17 DIAGNOSIS — I639 Cerebral infarction, unspecified: Secondary | ICD-10-CM | POA: Diagnosis not present

## 2022-08-17 LAB — CUP PACEART REMOTE DEVICE CHECK
Date Time Interrogation Session: 20231025091104
Implantable Pulse Generator Implant Date: 20230131

## 2022-08-30 NOTE — Progress Notes (Signed)
[facility] Summary Report / Loop Recorder 

## 2022-08-31 ENCOUNTER — Encounter: Payer: Self-pay | Admitting: Cardiology

## 2022-08-31 ENCOUNTER — Other Ambulatory Visit: Payer: Self-pay | Admitting: Emergency Medicine

## 2022-08-31 DIAGNOSIS — I10 Essential (primary) hypertension: Secondary | ICD-10-CM

## 2022-09-02 MED ORDER — ATORVASTATIN CALCIUM 80 MG PO TABS: 80.0000 mg | ORAL_TABLET | Freq: Every day | ORAL | 2 refills | Status: DC

## 2022-09-02 MED ORDER — LISINOPRIL-HYDROCHLOROTHIAZIDE 20-12.5 MG PO TABS: 2.0000 | ORAL_TABLET | Freq: Every day | ORAL | 2 refills | Status: DC

## 2022-09-19 ENCOUNTER — Telehealth: Payer: Self-pay | Admitting: Emergency Medicine

## 2022-09-19 ENCOUNTER — Ambulatory Visit (INDEPENDENT_AMBULATORY_CARE_PROVIDER_SITE_OTHER): Payer: Medicare Other

## 2022-09-19 DIAGNOSIS — I639 Cerebral infarction, unspecified: Secondary | ICD-10-CM

## 2022-09-19 NOTE — Telephone Encounter (Signed)
Left message for patient to call back to schedule Medicare Annual Wellness Visit   No hx of AWV eligible as of 03/24/17  Please schedule at anytime with [facility] Advisor if patient calls the office back.     Any questions, please call me at [phone]

## 2022-09-20 LAB — CUP PACEART REMOTE DEVICE CHECK
Date Time Interrogation Session: 20231127091323
Implantable Pulse Generator Implant Date: 20230131

## 2022-10-25 ENCOUNTER — Ambulatory Visit: Payer: Medicare Other | Attending: Internal Medicine

## 2022-10-25 DIAGNOSIS — I639 Cerebral infarction, unspecified: Secondary | ICD-10-CM

## 2022-10-25 LAB — CUP PACEART REMOTE DEVICE CHECK
Date Time Interrogation Session: 20240101231140
Implantable Pulse Generator Implant Date: 20230131

## 2022-10-31 NOTE — Progress Notes (Signed)
[facility] Summary Report / Loop Recorder 

## 2022-11-22 ENCOUNTER — Ambulatory Visit: Payer: Medicare Other | Admitting: Emergency Medicine

## 2022-11-24 NOTE — Progress Notes (Signed)
Carelink Summary Report / Loop Recorder

## 2022-11-27 LAB — CUP PACEART REMOTE DEVICE CHECK
Date Time Interrogation Session: 20240203230604
Implantable Pulse Generator Implant Date: 20230131

## 2022-11-28 ENCOUNTER — Ambulatory Visit: Payer: Medicare Other

## 2022-11-28 DIAGNOSIS — I639 Cerebral infarction, unspecified: Secondary | ICD-10-CM | POA: Diagnosis not present

## 2022-12-06 ENCOUNTER — Telehealth: Payer: Self-pay

## 2022-12-06 NOTE — Telephone Encounter (Signed)
The patient daughter had questions about the loop recorder. I answered them. I told her if they want to change from the app to the bedside monitor they can. She will give me a call back to let me know.

## 2022-12-12 ENCOUNTER — Ambulatory Visit: Payer: Medicare Other | Admitting: Emergency Medicine

## 2022-12-20 ENCOUNTER — Ambulatory Visit (INDEPENDENT_AMBULATORY_CARE_PROVIDER_SITE_OTHER): Payer: Medicare Other | Admitting: Emergency Medicine

## 2022-12-20 VITALS — BP 138/84 | HR 45 | Temp 98.3°F | Ht 65.5 in | Wt 199.1 lb

## 2022-12-20 DIAGNOSIS — Z8673 Personal history of transient ischemic attack (TIA), and cerebral infarction without residual deficits: Secondary | ICD-10-CM

## 2022-12-20 DIAGNOSIS — R7303 Prediabetes: Secondary | ICD-10-CM

## 2022-12-20 DIAGNOSIS — I10 Essential (primary) hypertension: Secondary | ICD-10-CM

## 2022-12-20 DIAGNOSIS — Z23 Encounter for immunization: Secondary | ICD-10-CM

## 2022-12-20 DIAGNOSIS — I639 Cerebral infarction, unspecified: Secondary | ICD-10-CM | POA: Diagnosis not present

## 2022-12-20 DIAGNOSIS — I480 Paroxysmal atrial fibrillation: Secondary | ICD-10-CM

## 2022-12-20 NOTE — Patient Instructions (Signed)
Health Maintenance After Age 73 After age 73, you are at a higher risk for certain long-term diseases and infections as well as injuries from falls. Falls are a major cause of broken bones and head injuries in people who are older than age 73. Getting regular preventive care can help to keep you healthy and well. Preventive care includes getting regular testing and making lifestyle changes as recommended by your health care provider. Talk with your health care provider about: Which screenings and tests you should have. A screening is a test that checks for a disease when you have no symptoms. A diet and exercise plan that is right for you. What should I know about screenings and tests to prevent falls? Screening and testing are the best ways to find a health problem early. Early diagnosis and treatment give you the best chance of managing medical conditions that are common after age 73. Certain conditions and lifestyle choices may make you more likely to have a fall. Your health care provider may recommend: Regular vision checks. Poor vision and conditions such as cataracts can make you more likely to have a fall. If you wear glasses, make sure to get your prescription updated if your vision changes. Medicine review. Work with your health care provider to regularly review all of the medicines you are taking, including over-the-counter medicines. Ask your health care provider about any side effects that may make you more likely to have a fall. Tell your health care provider if any medicines that you take make you feel dizzy or sleepy. Strength and balance checks. Your health care provider may recommend certain tests to check your strength and balance while standing, walking, or changing positions. Foot health exam. Foot pain and numbness, as well as not wearing proper footwear, can make you more likely to have a fall. Screenings, including: Osteoporosis screening. Osteoporosis is a condition that causes  the bones to get weaker and break more easily. Blood pressure screening. Blood pressure changes and medicines to control blood pressure can make you feel dizzy. Depression screening. You may be more likely to have a fall if you have a fear of falling, feel depressed, or feel unable to do activities that you used to do. Alcohol use screening. Using too much alcohol can affect your balance and may make you more likely to have a fall. Follow these instructions at home: Lifestyle Do not drink alcohol if: Your health care provider tells you not to drink. If you drink alcohol: Limit how much you have to: 0-1 drink a day for women. 0-2 drinks a day for men. Know how much alcohol is in your drink. In the [place] one drink equals one 12 oz bottle of beer (355 mL), one 5 oz glass of wine (148 mL), or one 1 oz glass of hard liquor (44 mL). Do not use any products that contain nicotine or tobacco. These products include cigarettes, chewing tobacco, and vaping devices, such as e-cigarettes. If you need help quitting, ask your health care provider. Activity  Follow a regular exercise program to stay fit. This will help you maintain your balance. Ask your health care provider what types of exercise are appropriate for you. If you need a cane or walker, use it as recommended by your health care provider. Wear supportive shoes that have nonskid soles. Safety  Remove any tripping hazards, such as rugs, cords, and clutter. Install safety equipment such as grab bars in bathrooms and safety rails on stairs. Keep rooms and walkways   well-lit. General instructions Talk with your health care provider about your risks for falling. Tell your health care provider if: You fall. Be sure to tell your health care provider about all falls, even ones that seem minor. You feel dizzy, tiredness (fatigue), or off-balance. Take over-the-counter and prescription medicines only as told by your health care provider. These include  supplements. Eat a healthy diet and maintain a healthy weight. A healthy diet includes low-fat dairy products, low-fat (lean) meats, and fiber from whole grains, beans, and lots of fruits and vegetables. Stay current with your vaccines. Schedule regular health, dental, and eye exams. Summary Having a healthy lifestyle and getting preventive care can help to protect your health and wellness after age 73. Screening and testing are the best way to find a health problem early and help you avoid having a fall. Early diagnosis and treatment give you the best chance for managing medical conditions that are more common for people who are older than age 73. Falls are a major cause of broken bones and head injuries in people who are older than age 73. Take precautions to prevent a fall at home. Work with your health care provider to learn what changes you can make to improve your health and wellness and to prevent falls. This information is not intended to replace advice given to you by your health care provider. Make sure you discuss any questions you have with your health care provider. Document Revised: 03/01/2021 Document Reviewed: 03/01/2021 Elsevier Patient Education  2023 [employer]  

## 2022-12-21 ENCOUNTER — Encounter: Payer: Self-pay | Admitting: Emergency Medicine

## 2022-12-21 MED ORDER — ATORVASTATIN CALCIUM 80 MG PO TABS: 80.0000 mg | ORAL_TABLET | Freq: Every day | ORAL | 2 refills | Status: DC

## 2022-12-21 NOTE — Assessment & Plan Note (Addendum)
Well-controlled hypertension. Continue Zestoretic 20-12.5 mg daily. BP Readings from Last 3 Encounters:  12/20/22 138/84  06/06/22 134/82  05/03/22 134/78  Secondary stroke prevention measures discussed Cardiovascular risks associated with hypertension discussed Diet and nutrition discussed

## 2022-12-21 NOTE — Progress Notes (Signed)
Darlene [name] 72 y.o.   Chief Complaint  Patient presents with   Follow-up    F/u appt BP meds, pain in your left leg , patient states that the pain is getting worse since after she had her stroke, patient has stroke in 2022    HISTORY OF PRESENT ILLNESS: This is a 72 y.o. female here for follow-up of hypertension.  Last office visit about a year ago. Accompanied by daughter. Chronic pain to left knee since stroke in 2022.  No trouble ambulating.  No balance issues.  Some difficulty getting up to chair but no trouble walking.  No recent falls. No other complaints or medical concerns today. Last cardiologist office visit on 06/06/2022, assessment and plan as follows: ASSESSMENT AND PLAN: 1. Paroxysmal Atrial Fibrillation (ICD10:  I48.0) The patient's CHA2DS2-VASc score is 5, indicating a 7.2% annual risk of stroke.   ILR shows 0.1% afib burden. We again discussed her stroke risk and the risks and benefits of anticoagulation. Concerns addressed.  Patient agreeable to starting Eliquis 5 mg BID. Will check bmet/cbc in one month.    2. Secondary Hypercoagulable State (ICD10:  D68.69) The patient is at significant risk for stroke/thromboembolism based upon her CHA2DS2-VASc Score of 5.  Start Apixaban (Eliquis).    3. Obesity Body mass index is 32.45 kg/m. Lifestyle modification was discussed and encouraged including regular physical activity and weight reduction.   4. HTN Stable, no changes today.   5. Bradycardia Not on any AV nodal agents Asymptomatic.     Follow up in the AF clinic in 2 months.      [facility] [place], [place] [address] [phone] 06/06/2022 1:15 PM  HPI   Prior to Admission medications   Medication Sig Start Date End Date Taking? Authorizing Provider  lisinopril-hydrochlorothiazide (ZESTORETIC) 20-12.5 MG tablet Take 2 tablets by mouth daily. Please start on 10/13/2021 09/02/22  Yes Nabila Albarracin,  Ines Bloomer, MD  apixaban (ELIQUIS) 5 MG TABS tablet Take 1 tablet (5 mg total) by mouth 2 (two) times daily. Patient not taking: Reported on 06/06/2022 05/03/22   Fenton, Clint R, PA  atorvastatin (LIPITOR) 80 MG tablet Take 1 tablet (80 mg total) by mouth daily. 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  Social History   Socioeconomic History   Marital status: Married    Spouse name: Not on file   Number of children: Not on file   Years of education: Not on file   Highest education level: Not on file  Occupational History   Not on file  Tobacco Use   Smoking status: Never   Smokeless tobacco: Never   Tobacco comments:    Never smoke 05/03/22  Substance and Sexual Activity   Alcohol use: No   Drug use: No   Sexual activity: Never  Other Topics Concern   Not on file  Social History Narrative   Not on file   Social Determinants of Health   Financial Resource Strain: Not on file  Food Insecurity: Not on file  Transportation Needs: Not on file  Physical Activity: Not on file  Stress: Not on file  Social Connections: Not on file  Intimate Partner Violence: Not on file    Family History  Problem Relation Age of Onset   Diabetes Mother        90s   Cancer Sister      Review of Systems   Constitutional: Negative.  Negative for chills and fever.  HENT: Negative.  Negative for congestion and sore throat.   Respiratory: Negative.  Negative for cough and shortness of breath.   Cardiovascular: Negative.  Negative for chest pain and palpitations.  Gastrointestinal:  Negative for abdominal pain, diarrhea, nausea and vomiting.  Genitourinary: Negative.  Negative for dysuria and hematuria.  Skin: Negative.  Negative for rash.  Neurological: Negative.  Negative for dizziness and headaches.  All other systems reviewed and are negative.  Today's Vitals   12/20/22 1604  BP: 138/84  Pulse: (!) 45  Temp: 98.3 F (36.8 C)  TempSrc: Oral  SpO2: 98%  Weight: 199 lb 2 oz (90.3 kg)  Height: 5' 5.5" (1.664 m)   Body mass index is 32.63 kg/m.   Physical Exam Vitals reviewed.  Constitutional:      Appearance: Normal appearance.  HENT:     Head: Normocephalic.     Mouth/Throat:     Mouth: Mucous membranes are moist.     Pharynx: Oropharynx is clear.  Eyes:     Extraocular Movements: Extraocular movements intact.     Pupils: Pupils are equal, round, and reactive to light.  Cardiovascular:     Rate and Rhythm: Normal rate and regular rhythm.     Pulses: Normal pulses.     Heart sounds: Normal heart sounds.  Pulmonary:     Effort: Pulmonary effort is normal.     Breath sounds: Normal breath sounds.  Abdominal:     Palpations: Abdomen is soft.     Tenderness: There is no abdominal tenderness.  Musculoskeletal:     Cervical back: No tenderness.     Right lower leg: No edema.     Left lower leg: No edema.  Lymphadenopathy:     Cervical: No cervical adenopathy.  Skin:    General: Skin is warm and dry.     Capillary Refill: Capillary refill takes less than 2 seconds.  Neurological:     Mental Status: She is alert and oriented to person, place, and time.     Cranial Nerves: No cranial nerve deficit.     Sensory: No sensory deficit.     Coordination: Coordination normal.      Gait: Gait normal.     Comments: Mild weakness left leg 4 out of 5  Psychiatric:        Mood and Affect: Mood normal.        Behavior: Behavior normal.      ASSESSMENT & PLAN: A total of 44 minutes was spent with the patient and counseling/coordination of care regarding preparing for this visit, review of most recent office visit note, review of most recent cardiologist office visit notes, review of multiple chronic medical conditions under management, cardiovascular risks associated with hypertension, secondary stroke prevention measures, education on nutrition, review of most recent blood work results and need for blood  work today, prognosis, documentation and need for follow-up with neurologist and myself.  Problem List Items Addressed This Visit       Cardiovascular and Mediastinum   Essential hypertension - Primary    Well-controlled hypertension. Continue Zestoretic 20-12.5 mg daily. BP Readings from Last 3 Encounters:  12/20/22 138/84  06/06/22 134/82  05/03/22 134/78  Secondary stroke prevention measures discussed Cardiovascular risks associated with hypertension discussed Diet and nutrition discussed       Relevant Medications   atorvastatin (LIPITOR) 80 MG tablet   Other Relevant Orders   CBC with Differential/Platelet   Lipid panel   Comprehensive metabolic panel   Paroxysmal atrial fibrillation (HCC)    Stable.  Regular rhythm on exam. Last cardiologist office visit recommended to start Eliquis due to CHA2DS2-VASc score of 5. Patient not taking Eliquis or any other anticoagulant.  Has appointment to see cardiologist next month. Recommend to start daily baby aspirin today.      Relevant Medications   atorvastatin (LIPITOR) 80 MG tablet     Other   History of stroke    Secondary stroke prevention measures discussed. Recommend to restart atorvastatin 80 mg daily Recommend daily baby aspirin Blood pressure control discussed History of prediabetes.   Blood work done today Diet and nutrition discussed Needs neurology follow-up.  Referral placed today.      Relevant Medications   atorvastatin (LIPITOR) 80 MG tablet   Other Relevant Orders   Ambulatory referral to Neurology   Other Visit Diagnoses     Need for vaccination       Prediabetes          Patient Instructions  Health Maintenance After Age 74 After age 33, you are at a higher risk for certain long-term diseases and infections as well as injuries from falls. Falls are a major cause of broken bones and head injuries in people who are older than age 34. Getting regular preventive care can help to keep you healthy and well. Preventive care includes getting regular testing and making lifestyle changes as recommended by your health care provider. Talk with your health care provider about: Which screenings and tests you should have. A screening is a test that checks for a disease when you have no symptoms. A diet and exercise plan that is right for you. What should I know about screenings and tests to prevent falls? Screening and testing are the best ways to find a health problem early. Early diagnosis and treatment give you the best chance of managing medical conditions that are common after age 13. Certain conditions and lifestyle choices may make you more likely to have a fall. Your health care provider may recommend: Regular vision checks. Poor vision and conditions such as cataracts can make you more likely to have a fall. If you wear glasses, make sure to get your prescription updated if your vision changes. Medicine review. Work with your health care provider to regularly review all of the medicines you are taking, including over-the-counter medicines. Ask your health care provider about any side effects that may make you more likely to have a fall. Tell your health care provider if any medicines that you take make you feel dizzy or sleepy. Strength and balance checks. Your health  care provider may recommend certain tests to check your strength and balance while standing, walking, or changing positions. Foot health exam. Foot pain and numbness, as well as not wearing proper footwear, can make you more likely to have a fall. Screenings,  including: Osteoporosis screening. Osteoporosis is a condition that causes the bones to get weaker and break more easily. Blood pressure screening. Blood pressure changes and medicines to control blood pressure can make you feel dizzy. Depression screening. You may be more likely to have a fall if you have a fear of falling, feel depressed, or feel unable to do activities that you used to do. Alcohol use screening. Using too much alcohol can affect your balance and may make you more likely to have a fall. Follow these instructions at home: Lifestyle Do not drink alcohol if: Your health care provider tells you not to drink. If you drink alcohol: Limit how much you have to: 0-1 drink a day for women. 0-2 drinks a day for men. Know how much alcohol is in your drink. In the [place] one drink equals one 12 oz bottle of beer (355 mL), one 5 oz glass of wine (148 mL), or one 1 oz glass of hard liquor (44 mL). Do not use any products that contain nicotine or tobacco. These products include cigarettes, chewing tobacco, and vaping devices, such as e-cigarettes. If you need help quitting, ask your health care provider. Activity  Follow a regular exercise program to stay fit. This will help you maintain your balance. Ask your health care provider what types of exercise are appropriate for you. If you need a cane or walker, use it as recommended by your health care provider. Wear supportive shoes that have nonskid soles. Safety  Remove any tripping hazards, such as rugs, cords, and clutter. Install safety equipment such as grab bars in bathrooms and safety rails on stairs. Keep rooms and walkways well-lit. General instructions Talk with your  health care provider about your risks for falling. Tell your health care provider if: You fall. Be sure to tell your health care provider about all falls, even ones that seem minor. You feel dizzy, tiredness (fatigue), or off-balance. Take over-the-counter and prescription medicines only as told by your health care provider. These include supplements. Eat a healthy diet and maintain a healthy weight. A healthy diet includes low-fat dairy products, low-fat (lean) meats, and fiber from whole grains, beans, and lots of fruits and vegetables. Stay current with your vaccines. Schedule regular health, dental, and eye exams. Summary Having a healthy lifestyle and getting preventive care can help to protect your health and wellness after age 53. Screening and testing are the best way to find a health problem early and help you avoid having a fall. Early diagnosis and treatment give you the best chance for managing medical conditions that are more common for people who are older than age 71. Falls are a major cause of broken bones and head injuries in people who are older than age 78. Take precautions to prevent a fall at home. Work with your health care provider to learn what changes you can make to improve your health and wellness and to prevent falls. This information is not intended to replace advice given to you by your health care provider. Make sure you discuss any questions you have with your health care provider. Document Revised: 03/01/2021 Document Reviewed: 03/01/2021 Elsevier Patient Education  [place], MD [facility]

## 2022-12-21 NOTE — Assessment & Plan Note (Signed)
Secondary stroke prevention measures discussed. Recommend to restart atorvastatin 80 mg daily Recommend daily baby aspirin Blood pressure control discussed History of prediabetes.  Blood work done today Diet and nutrition discussed Needs neurology follow-up.  Referral placed today.

## 2022-12-21 NOTE — Assessment & Plan Note (Signed)
Stable.  Regular rhythm on exam. Last cardiologist office visit recommended to start Eliquis due to CHA2DS2-VASc score of 5. Patient not taking Eliquis or any other anticoagulant.  Has appointment to see cardiologist next month. Recommend to start daily baby aspirin today.

## 2022-12-22 DIAGNOSIS — I129 Hypertensive chronic kidney disease with stage 1 through stage 4 chronic kidney disease, or unspecified chronic kidney disease: Secondary | ICD-10-CM | POA: Diagnosis not present

## 2022-12-22 DIAGNOSIS — I480 Paroxysmal atrial fibrillation: Secondary | ICD-10-CM | POA: Diagnosis not present

## 2022-12-22 DIAGNOSIS — N1832 Chronic kidney disease, stage 3b: Secondary | ICD-10-CM | POA: Diagnosis not present

## 2022-12-22 DIAGNOSIS — R001 Bradycardia, unspecified: Secondary | ICD-10-CM | POA: Diagnosis not present

## 2022-12-22 DIAGNOSIS — E785 Hyperlipidemia, unspecified: Secondary | ICD-10-CM | POA: Diagnosis not present

## 2022-12-24 LAB — LAB REPORT - SCANNED
Albumin, Urine POC: 16.4
Creatinine, POC: 156.2 mg/dL
EGFR: 45
Microalb Creat Ratio: 10

## 2023-01-02 ENCOUNTER — Ambulatory Visit: Payer: Medicare Other

## 2023-01-02 DIAGNOSIS — I639 Cerebral infarction, unspecified: Secondary | ICD-10-CM

## 2023-01-03 LAB — CUP PACEART REMOTE DEVICE CHECK
Date Time Interrogation Session: 20240310230503
Implantable Pulse Generator Implant Date: 20230131

## 2023-01-11 ENCOUNTER — Encounter: Payer: Self-pay | Admitting: Nephrology

## 2023-01-11 NOTE — Progress Notes (Signed)
[facility] Summary Report / Loop Recorder 

## 2023-01-12 ENCOUNTER — Telehealth: Payer: Self-pay

## 2023-01-12 NOTE — Telephone Encounter (Signed)
Called patient to schedule Medicare Annual Wellness Visit (AWV). Left message for patient to call back and schedule Medicare Annual Wellness Visit (AWV).  Last date of AWV: eligible as of 06/24/16 for AWV-I  Please schedule an appointment at any time with [facility].   Norton Blizzard, [place] [facility])  [place] [facility] [phone]

## 2023-02-06 ENCOUNTER — Ambulatory Visit (INDEPENDENT_AMBULATORY_CARE_PROVIDER_SITE_OTHER): Payer: Medicare Other

## 2023-02-06 DIAGNOSIS — I639 Cerebral infarction, unspecified: Secondary | ICD-10-CM | POA: Diagnosis not present

## 2023-02-06 LAB — CUP PACEART REMOTE DEVICE CHECK
Date Time Interrogation Session: 20240414230544
Implantable Pulse Generator Implant Date: 20230131

## 2023-02-09 NOTE — Progress Notes (Signed)
[facility] Summary Report / Loop Recorder 

## 2023-02-27 ENCOUNTER — Ambulatory Visit: Payer: Medicare Other | Attending: Cardiology | Admitting: Cardiology

## 2023-02-27 ENCOUNTER — Encounter: Payer: Self-pay | Admitting: Cardiology

## 2023-02-27 VITALS — BP 118/76 | HR 48 | Ht 65.5 in | Wt 208.0 lb

## 2023-02-27 DIAGNOSIS — D6869 Other thrombophilia: Secondary | ICD-10-CM

## 2023-02-27 DIAGNOSIS — I480 Paroxysmal atrial fibrillation: Secondary | ICD-10-CM | POA: Diagnosis not present

## 2023-02-27 DIAGNOSIS — I10 Essential (primary) hypertension: Secondary | ICD-10-CM | POA: Diagnosis not present

## 2023-02-27 NOTE — Progress Notes (Signed)
Electrophysiology Office Note   Date:  02/27/2023   ID:  Darlene [name], DOB 06/19/1951, MRN [id number]  PCP:  Georgina Quint, MD  Cardiologist:   Primary Electrophysiologist:  Dreden Rivere Jorja Loa, MD    Chief Complaint: Cryptogenic stroke   History of Present Illness: Darlene [name] is a 72 y.o. female who is being seen today for the evaluation of cryptogenic stroke at the request of Georgina Quint, *. Presenting today for electrophysiology evaluation.  She has a history significant hypertension, CVA, atrial fibrillation.  She was diagnosed with atrial fibrillation 04/29/2022 on her ILR was implanted for stroke.  She is now on Eliquis.  Today, denies symptoms of palpitations, chest pain, shortness of breath, orthopnea, PND, lower extremity edema, claudication, dizziness, presyncope, syncope, bleeding, or neurologic sequela. The patient is tolerating medications without difficulties.    Past Medical History:  Diagnosis Date   Hypertension    History reviewed. No pertinent surgical history.   Current Outpatient Medications  Medication Sig Dispense Refill   apixaban (ELIQUIS) 5 MG TABS tablet Take 1 tablet (5 mg total) by mouth 2 (two) times daily. 60 tablet 6   atorvastatin (LIPITOR) 80 MG tablet Take 1 tablet (80 mg total) by mouth daily. 90 tablet 2   lisinopril-hydrochlorothiazide (ZESTORETIC) 20-12.5 MG tablet Take 2 tablets by mouth daily. Please start on 10/13/2021 180 tablet 2   No current facility-administered medications for this visit.    Allergies:   Patient has no known allergies.   Social History:  The patient  reports that she has never smoked. She has never used smokeless tobacco. She reports that she does not drink alcohol and does not use drugs.   Family History:  The patient's family history includes Cancer in her sister; Diabetes in her mother.   ROS:  Please see the history of present illness.   Otherwise, review of systems is positive for  none.   All other systems are reviewed and negative.   PHYSICAL EXAM: VS:  BP 118/76   Pulse (!) 48   Ht 5' 5.5" (1.664 m)   Wt 208 lb (94.3 kg)   SpO2 96%   BMI 34.09 kg/m  , BMI Body mass index is 34.09 kg/m. GEN: Well nourished, well developed, in no acute distress  HEENT: normal  Neck: no JVD, carotid bruits, or masses Cardiac: RRR; no murmurs, rubs, or gallops,no edema  Respiratory:  clear to auscultation bilaterally, normal work of breathing GI: soft, nontender, nondistended, + BS MS: no deformity or atrophy  Skin: warm and dry, device site well healed Neuro:  Strength and sensation are intact Psych: euthymic mood, full affect  EKG:  EKG is ordered today. Personal review of the ekg ordered shows sinus rhythm  Personal review of the device interrogation today. Results in [facility]    Recent Labs: No results found for requested labs within last 365 days.    Lipid Panel     Component Value Date/Time   CHOL 141 02/08/2022 1448   CHOL 170 12/14/2021 1433   TRIG 61.0 02/08/2022 1448   HDL 43.90 02/08/2022 1448   HDL 46 12/14/2021 1433   CHOLHDL 3 02/08/2022 1448   VLDL 12.2 02/08/2022 1448   LDLCALC 85 02/08/2022 1448   LDLCALC 108 (H) 12/14/2021 1433     Wt Readings from Last 3 Encounters:  02/27/23 208 lb (94.3 kg)  12/20/22 199 lb 2 oz (90.3 kg)  06/06/22 198 lb (89.8 kg)      Other studies  Reviewed: Additional studies/ records that were reviewed today include: TTE 10/10/21  Review of the above records today demonstrates:   1. Left ventricular ejection fraction, by estimation, is 65 to 70%. Left  ventricular ejection fraction by 3D volume is 65 %. The left ventricle has  normal function. The left ventricle has no regional wall motion  abnormalities. There is severe  hypertrophy of the septum. The rest of the LV segments demonstrate  moderate left ventricular hypertrophy. Left ventricular diastolic  parameters are consistent with Grade I diastolic  dysfunction (impaired  relaxation).   2. Right ventricular systolic function is normal. The right ventricular  size is normal. There is mildly elevated pulmonary artery systolic  pressure. The estimated right ventricular systolic pressure is 38.2 mmHg.   3. Left atrial size was severely dilated.   4. The mitral valve is normal in structure. Mild mitral valve  regurgitation.   5. The aortic valve is tricuspid. There is mild calcification of the  aortic valve. There is mild thickening of the aortic valve. Aortic valve  regurgitation is not visualized. Aortic valve sclerosis is present, with  no evidence of aortic valve stenosis.   6. The inferior vena cava is normal in size with <50% respiratory  variability, suggesting right atrial pressure of 8 mmHg.   Cardiac monitor 10/22/2022 personally reviewed No atrial fibrillation noted Less than 1% ventricular ectopy Sinus rhythm and sinus tachycardia noted No other arrhythmias noted   ASSESSMENT AND PLAN:  1.  Paroxysmal atrial fibrillation: CHA2DS2-VASc of 5.  Low burden on cardiac monitor.  Currently on Eliquis.  [name] continue Eliquis and monitor A-fib burden via ILR.  2.  Secondary hypercoagulable state: Currently on Eliquis for atrial fibrillation  3.  Obesity: Lifestyle modification encouraged Body mass index is 34.09 kg/m.  4.  Hypertension: Currently well-controlled  Current medicines are reviewed at length with the patient today.   The patient does not have concerns regarding her medicines.  The following changes were made today: None  Labs/ tests ordered today include:  Orders Placed This Encounter  Procedures   EKG 12-Lead     Disposition:   FU 1 year  Signed, Dandy Lazaro Jorja Loa, MD  02/27/2023 11:39 AM     [facility] [address] [place] [address] [phone] (office) [phone] (fax)

## 2023-02-27 NOTE — Patient Instructions (Signed)
Medication Instructions:  Your physician recommends that you continue on your current medications as directed. Please refer to the Current Medication list given to you today.  *If you need a refill on your cardiac medications before your next appointment, please call your pharmacy*   Lab Work: None ordered  Testing/Procedures: None ordered   Follow-Up: At [facility], you and your health needs are our priority.  As part of our continuing mission to provide you with exceptional heart care, we have created designated Provider Care Teams.  These Care Teams include your primary Cardiologist (physician) and Advanced Practice Providers (APPs -  Physician Assistants and Nurse Practitioners) who all work together to provide you with the care you need, when you need it.  Remote monitoring is used to monitor your Pacemaker or ICD from home. This monitoring reduces the number of office visits required to check your device to one time per year. It allows [place] to keep an eye on the functioning of your device to ensure it is working properly. You are scheduled for a device check from home on 03/13/23. You may send your transmission at any time that day. If you have a wireless device, the transmission will be sent automatically. After your physician reviews your transmission, you will receive a postcard with your next transmission date.  Your next appointment:   1 year(s)  The format for your next appointment:   In Person  Provider:   You will follow up in the Atrial Fibrillation Clinic located at [facility]. Your provider will be: Clint R. Fenton, PA-C{ Or  Landry Mellow, PA  Thank you for choosing [facility]!!   Dory Horn, RN [phone]

## 2023-03-13 ENCOUNTER — Ambulatory Visit (INDEPENDENT_AMBULATORY_CARE_PROVIDER_SITE_OTHER): Payer: Medicare Other

## 2023-03-13 DIAGNOSIS — I639 Cerebral infarction, unspecified: Secondary | ICD-10-CM

## 2023-03-13 LAB — CUP PACEART REMOTE DEVICE CHECK
Date Time Interrogation Session: 20240517230455
Implantable Pulse Generator Implant Date: 20230131

## 2023-03-13 NOTE — Progress Notes (Signed)
[facility] Summary Report / Loop Recorder 

## 2023-04-07 NOTE — Progress Notes (Signed)
[facility] Summary Report / Loop Recorder 

## 2023-04-17 ENCOUNTER — Ambulatory Visit (INDEPENDENT_AMBULATORY_CARE_PROVIDER_SITE_OTHER): Payer: Medicare Other

## 2023-04-17 DIAGNOSIS — I639 Cerebral infarction, unspecified: Secondary | ICD-10-CM

## 2023-04-17 LAB — CUP PACEART REMOTE DEVICE CHECK
Date Time Interrogation Session: 20240623230227
Implantable Pulse Generator Implant Date: 20230131

## 2023-05-05 NOTE — Progress Notes (Signed)
[facility] Summary Report / Loop Recorder 

## 2023-05-22 ENCOUNTER — Ambulatory Visit (INDEPENDENT_AMBULATORY_CARE_PROVIDER_SITE_OTHER): Payer: Medicare Other

## 2023-05-22 DIAGNOSIS — I639 Cerebral infarction, unspecified: Secondary | ICD-10-CM | POA: Diagnosis not present

## 2023-06-06 ENCOUNTER — Ambulatory Visit (INDEPENDENT_AMBULATORY_CARE_PROVIDER_SITE_OTHER): Payer: Medicare Other | Admitting: Emergency Medicine

## 2023-06-06 ENCOUNTER — Encounter: Payer: Self-pay | Admitting: Emergency Medicine

## 2023-06-06 ENCOUNTER — Ambulatory Visit (INDEPENDENT_AMBULATORY_CARE_PROVIDER_SITE_OTHER): Payer: Medicare Other

## 2023-06-06 VITALS — BP 158/92 | HR 49 | Temp 98.8°F | Ht 65.5 in | Wt 204.2 lb

## 2023-06-06 DIAGNOSIS — M25562 Pain in left knee: Secondary | ICD-10-CM

## 2023-06-06 DIAGNOSIS — G8929 Other chronic pain: Secondary | ICD-10-CM

## 2023-06-06 DIAGNOSIS — M1712 Unilateral primary osteoarthritis, left knee: Secondary | ICD-10-CM | POA: Diagnosis not present

## 2023-06-06 DIAGNOSIS — I480 Paroxysmal atrial fibrillation: Secondary | ICD-10-CM | POA: Diagnosis not present

## 2023-06-06 DIAGNOSIS — Z8673 Personal history of transient ischemic attack (TIA), and cerebral infarction without residual deficits: Secondary | ICD-10-CM

## 2023-06-06 DIAGNOSIS — I10 Essential (primary) hypertension: Secondary | ICD-10-CM

## 2023-06-06 DIAGNOSIS — D6869 Other thrombophilia: Secondary | ICD-10-CM

## 2023-06-06 DIAGNOSIS — Z1231 Encounter for screening mammogram for malignant neoplasm of breast: Secondary | ICD-10-CM

## 2023-06-06 DIAGNOSIS — N1831 Chronic kidney disease, stage 3a: Secondary | ICD-10-CM

## 2023-06-06 DIAGNOSIS — Z13820 Encounter for screening for osteoporosis: Secondary | ICD-10-CM

## 2023-06-06 NOTE — Assessment & Plan Note (Signed)
Chronic pain affecting quality of life Pain management discussed.  Advised to avoid NSAIDs Okay to take Tylenol We will do x-ray today and refer to sports medicine.

## 2023-06-06 NOTE — Assessment & Plan Note (Signed)
History of stroke in 2022 Secondary stroke prevention measures discussed Continues atorvastatin 80 mg daily Blood pressure not well-controlled due to noncompliance Not taking Eliquis.  Afraid of side effects

## 2023-06-06 NOTE — Assessment & Plan Note (Signed)
Advised to stay well-hydrated and avoid NSAIDs Does not want to have blood work done today.

## 2023-06-06 NOTE — Assessment & Plan Note (Addendum)
Currently on normal sinus rhythm with controlled ventricular rate. Elevated blood pressure reading in the office today Not taking Eliquis.  Afraid of side effects Explained benefits of anticoagulation and need to avoid second stroke Encouraged to start taking Eliquis 5 mg twice a day Diet and nutrition discussed.  Avoidance of NSAIDs discussed. Fall precautions given.

## 2023-06-06 NOTE — Patient Instructions (Signed)
Stroke Prevention Some medical conditions and lifestyle choices can lead to a higher risk for a stroke. You can help to prevent a stroke by eating healthy foods and exercising. It also helps to not smoke and to manage any health problems you may have. How can this condition affect me? A stroke is an emergency. It should be treated right away. A stroke can lead to brain damage or threaten your life. There is a better chance of surviving and getting better after a stroke if you get medical help right away. What can increase my risk? The following medical conditions may increase your risk of a stroke: Diseases of the heart and blood vessels (cardiovascular disease). High blood pressure (hypertension). Diabetes. High cholesterol. Sickle cell disease. Problems with blood clotting. Being very overweight. Sleeping problems (obstructivesleep apnea). Other risk factors include: Being older than age 60. A history of blood clots, stroke, or mini-stroke (TIA). Race, ethnic background, or a family history of stroke. Smoking or using tobacco products. Taking birth control pills, especially if you smoke. Heavy alcohol and drug use. Not being active. What actions can I take to prevent this? Manage your health conditions High cholesterol. Eat a healthy diet. If this is not enough to manage your cholesterol, you may need to take medicines. Take medicines as told by your doctor. High blood pressure. Try to keep your blood pressure below 130/80. If your blood pressure cannot be managed through a healthy diet and regular exercise, you may need to take medicines. Take medicines as told by your doctor. Ask your doctor if you should check your blood pressure at home. Have your blood pressure checked every year. Diabetes. Eat a healthy diet and get regular exercise. If your blood sugar (glucose) cannot be managed through diet and exercise, you may need to take medicines. Take medicines as told by your  doctor. Talk to your doctor about getting checked for sleeping problems. Signs of a problem can include: Snoring a lot. Feeling very tired. Make sure that you manage any other conditions you have. Nutrition  Follow instructions from your doctor about what to eat or drink. You may be told to: Eat and drink fewer calories each day. Limit how much salt (sodium) you use to 1,500 milligrams (mg) each day. Use only healthy fats for cooking, such as olive oil, canola oil, and sunflower oil. Eat healthy foods. To do this: Choose foods that are high in fiber. These include whole grains, and fresh fruits and vegetables. Eat at least 5 servings of fruits and vegetables a day. Try to fill one-half of your plate with fruits and vegetables at each meal. Choose low-fat (lean) proteins. These include low-fat cuts of meat, chicken without skin, fish, tofu, beans, and nuts. Eat low-fat dairy products. Avoid foods that: Are high in salt. Have saturated fat. Have trans fat. Have cholesterol. Are processed or pre-made. Count how many carbohydrates you eat and drink each day. Lifestyle If you drink alcohol: Limit how much you have to: 0-1 drink a day for women who are not pregnant. 0-2 drinks a day for men. Know how much alcohol is in your drink. In the [place] one drink equals one 12 oz bottle of beer (355mL), one 5 oz glass of wine (148mL), or one 1 oz glass of hard liquor (44mL). Do not smoke or use any products that have nicotine or tobacco. If you need help quitting, ask your doctor. Avoid secondhand smoke. Do not use drugs. Activity  Try to stay at a   healthy weight. Get at least 30 minutes of exercise on most days, such as: Fast walking. Biking. Swimming. Medicines Take over-the-counter and prescription medicines only as told by your doctor. Avoid taking birth control pills. Talk to your doctor about the risks of taking birth control pills if: You are over 20 years old. You smoke. You get  very bad headaches. You have had a blood clot. Where to find more information American Stroke Association: www.strokeassociation.org Get help right away if: You or a loved one has any signs of a stroke. "BE FAST" is an easy way to remember the warning signs: B - Balance. Dizziness, sudden trouble walking, or loss of balance. E - Eyes. Trouble seeing or a change in how you see. F - Face. Sudden weakness or loss of feeling of the face. The face or eyelid may droop on one side. A - Arms. Weakness or loss of feeling in an arm. This happens all of a sudden and most often on one side of the body. S - Speech. Sudden trouble speaking, slurred speech, or trouble understanding what people say. T - Time. Time to call emergency services. Write down what time symptoms started. You or a loved one has other signs of a stroke, such as: A sudden, very bad headache with no known cause. Feeling like you may vomit (nausea). Vomiting. A seizure. These symptoms may be an emergency. Get help right away. Call your local emergency services (911 in the [place] Do not wait to see if the symptoms will go away. Do not drive yourself to the hospital. Summary You can help to prevent a stroke by eating healthy, exercising, and not smoking. It also helps to manage any health problems you have. Do not smoke or use any products that contain nicotine or tobacco. Get help right away if you or a loved one has any signs of a stroke. This information is not intended to replace advice given to you by your health care provider. Make sure you discuss any questions you have with your health care provider. Document Revised: 09/12/2022 Document Reviewed: 09/12/2022 Elsevier Patient Education  2024 [employer].

## 2023-06-06 NOTE — Assessment & Plan Note (Signed)
Elevated blood pressure reading in the office.  Did not take medication this morning Importance of blood pressure control to avoid second stroke discussed Continue Zestoretic 20-12.5 mg 2 tablets daily Cardiovascular risk associated with uncontrolled hypertension discussed Dietary approaches to stop hypertension discussed

## 2023-06-06 NOTE — Progress Notes (Signed)
Darlene [name] 72 y.o.   Chief Complaint  Patient presents with   Leg Pain    Patient is having some left leg pain, states the pain has always been there since she had her stroke . Consistent pain     HISTORY OF PRESENT ILLNESS: This is a 72 y.o. female here for 54-month follow-up of chronic medical conditions including hypertension and paroxysmal atrial fibrillation.  History of stroke in the past. Also complaining of chronic left knee pain Did not take blood pressure medications this morning Has not been taking Eliquis as prescribed by cardiologist Last cardiologist office visit assessment and plan as follows: ASSESSMENT AND PLAN:   1.  Paroxysmal atrial fibrillation: CHA2DS2-VASc of 5.  Low burden on cardiac monitor.  Currently on Eliquis.  Will continue Eliquis and monitor A-fib burden via ILR.   2.  Secondary hypercoagulable state: Currently on Eliquis for atrial fibrillation   3.  Obesity: Lifestyle modification encouraged Body mass index is 34.09 kg/m.   4.  Hypertension: Currently well-controlled   Current medicines are reviewed at length with the patient today.   The patient does not have concerns regarding her medicines.  The following changes were made today: None  Leg Pain      Prior to Admission medications   Medication Sig Start Date End Date Taking? Authorizing Provider  atorvastatin (LIPITOR) 80 MG tablet Take 1 tablet (80 mg total) by mouth daily. 12/21/22  Yes , Eilleen Kempf, MD  lisinopril-hydrochlorothiazide (ZESTORETIC) 20-12.5 MG tablet Take 2 tablets by mouth daily. Please start on 10/13/2021 09/02/22  Yes , Eilleen Kempf, MD  apixaban (ELIQUIS) 5 MG TABS tablet Take 1 tablet (5 mg total) by mouth 2 (two) times daily. Patient not taking: Reported on 06/06/2023 05/03/22   Alphonzo Severance R, PA    No Known Allergies  Patient Active Problem List   Diagnosis Date Noted   Stage 3a chronic kidney disease (HCC) 06/06/2023   Paroxysmal atrial  fibrillation (HCC) 05/03/2022   Secondary hypercoagulable state (HCC) 05/03/2022   History of stroke 11/16/2021   Cerebral thrombosis with cerebral infarction 10/10/2021   HTN (hypertension) 10/10/2021   HTN (hypertension), malignant 10/09/2021   Essential hypertension 08/17/2018    Past Medical History:  Diagnosis Date   Hypertension     History reviewed. No pertinent surgical history.  Social History   Socioeconomic History   Marital status: Married    Spouse name: Not on file   Number of children: Not on file   Years of education: Not on file   Highest education level: Not on file  Occupational History   Not on file  Tobacco Use   Smoking status: Never   Smokeless tobacco: Never   Tobacco comments:    Never smoke 05/03/22  Substance and Sexual Activity   Alcohol use: No   Drug use: No   Sexual activity: Never  Other Topics Concern   Not on file  Social History Narrative   Not on file   Social Determinants of Health   Financial Resource Strain: Not on file  Food Insecurity: Not on file  Transportation Needs: Not on file  Physical Activity: Not on file  Stress: Not on file  Social Connections: Not on file  Intimate Partner Violence: Not on file    Family History  Problem Relation Age of Onset   Diabetes Mother        90s   Cancer Sister      Review of Systems  Constitutional: Negative.  Negative for chills and fever.  HENT: Negative.  Negative for congestion, nosebleeds and sore throat.   Respiratory: Negative.  Negative for cough, hemoptysis and shortness of breath.   Cardiovascular: Negative.  Negative for chest pain and palpitations.  Gastrointestinal:  Negative for abdominal pain, diarrhea, nausea and vomiting.  Genitourinary: Negative.  Negative for dysuria and hematuria.  Skin: Negative.  Negative for rash.  Neurological: Negative.  Negative for dizziness and headaches.  All other systems reviewed and are negative.   Today's Vitals    06/06/23 0837  BP: (!) 160/100  Pulse: (!) 49  Temp: 98.8 F (37.1 C)  TempSrc: Oral  SpO2: 96%  Weight: 204 lb 4 oz (92.6 kg)  Height: 5' 5.5" (1.664 m)   Body mass index is 33.47 kg/m.   Physical Exam Vitals reviewed.  Constitutional:      Appearance: Normal appearance.  HENT:     Head: Normocephalic.     Mouth/Throat:     Mouth: Mucous membranes are moist.     Pharynx: Oropharynx is clear.  Eyes:     Extraocular Movements: Extraocular movements intact.     Pupils: Pupils are equal, round, and reactive to light.  Cardiovascular:     Rate and Rhythm: Normal rate and regular rhythm.     Pulses: Normal pulses.     Heart sounds: Normal heart sounds.  Pulmonary:     Effort: Pulmonary effort is normal.     Breath sounds: Normal breath sounds.  Musculoskeletal:     Comments: Left knee: No swelling or tenderness to palpation.  Complaining of pain during range of motion.  Stable in flexion and extension.  Skin:    General: Skin is warm and dry.     Capillary Refill: Capillary refill takes less than 2 seconds.  Neurological:     General: No focal deficit present.     Mental Status: She is alert and oriented to person, place, and time.  Psychiatric:        Mood and Affect: Mood normal.        Behavior: Behavior normal.    DG Knee 1-2 Views Left  Result Date: 06/06/2023 CLINICAL DATA:  Left knee pain since having stroke in Dec 2023. EXAM: LEFT KNEE - 1-2 VIEW COMPARISON:  None available FINDINGS: Moderate degenerative changes of the patellofemoral and medial compartments. No fracture or dislocation. Soft tissues are unremarkable. IMPRESSION: 1. No acute abnormality of the left knee. 2. Moderate osteoarthrosis of the left knee. Electronically Signed   By: Acquanetta Belling M.D.   On: 06/06/2023 10:40     ASSESSMENT & PLAN: A total of 44 minutes was spent with the patient and counseling/coordination of care regarding preparing for this visit, review of most recent office visit  notes, review of multiple chronic medical conditions under management, review of all medications, secondary stroke prevention measures, education and nutrition, review of x-ray report done today, review of health maintenance items, prognosis, documentation and need for follow-up.  Problem List Items Addressed This Visit       Cardiovascular and Mediastinum   Essential hypertension    Elevated blood pressure reading in the office.  Did not take medication this morning Importance of blood pressure control to avoid second stroke discussed Continue Zestoretic 20-12.5 mg 2 tablets daily Cardiovascular risk associated with uncontrolled hypertension discussed Dietary approaches to stop hypertension discussed      Paroxysmal atrial fibrillation (HCC) - Primary    Currently on normal sinus rhythm with controlled ventricular  rate. Elevated blood pressure reading in the office today Not taking Eliquis.  Afraid of side effects Explained benefits of anticoagulation and need to avoid second stroke Encouraged to start taking Eliquis 5 mg twice a day Diet and nutrition discussed.  Avoidance of NSAIDs discussed. Fall precautions given.        Genitourinary   Stage 3a chronic kidney disease (HCC)    Advised to stay well-hydrated and avoid NSAIDs Does not want to have blood work done today.        Other   History of stroke    History of stroke in 2022 Secondary stroke prevention measures discussed Continues atorvastatin 80 mg daily Blood pressure not well-controlled due to noncompliance Not taking Eliquis.  Afraid of side effects      Secondary hypercoagulable state (HCC)   Chronic pain of left knee    Chronic pain affecting quality of life Pain management discussed.  Advised to avoid NSAIDs Okay to take Tylenol We will do x-ray today and refer to sports medicine.      Relevant Orders   Ambulatory referral to Sports Medicine   DG Knee 1-2 Views Left   Other Visit Diagnoses      Osteoporosis screening       Screening mammogram for breast cancer       Relevant Orders   MM Digital Screening      Patient Instructions  Stroke Prevention Some medical conditions and lifestyle choices can lead to a higher risk for a stroke. You can help to prevent a stroke by eating healthy foods and exercising. It also helps to not smoke and to manage any health problems you may have. How can this condition affect me? A stroke is an emergency. It should be treated right away. A stroke can lead to brain damage or threaten your life. There is a better chance of surviving and getting better after a stroke if you get medical help right away. What can increase my risk? The following medical conditions may increase your risk of a stroke: Diseases of the heart and blood vessels (cardiovascular disease). High blood pressure (hypertension). Diabetes. High cholesterol. Sickle cell disease. Problems with blood clotting. Being very overweight. Sleeping problems (obstructivesleep apnea). Other risk factors include: Being older than age 32. A history of blood clots, stroke, or mini-stroke (TIA). Race, ethnic background, or a family history of stroke. Smoking or using tobacco products. Taking birth control pills, especially if you smoke. Heavy alcohol and drug use. Not being active. What actions can I take to prevent this? Manage your health conditions High cholesterol. Eat a healthy diet. If this is not enough to manage your cholesterol, you may need to take medicines. Take medicines as told by your doctor. High blood pressure. Try to keep your blood pressure below 130/80. If your blood pressure cannot be managed through a healthy diet and regular exercise, you may need to take medicines. Take medicines as told by your doctor. Ask your doctor if you should check your blood pressure at home. Have your blood pressure checked every year. Diabetes. Eat a healthy diet and get regular  exercise. If your blood sugar (glucose) cannot be managed through diet and exercise, you may need to take medicines. Take medicines as told by your doctor. Talk to your doctor about getting checked for sleeping problems. Signs of a problem can include: Snoring a lot. Feeling very tired. Make sure that you manage any other conditions you have. Nutrition  Follow instructions from your doctor  about what to eat or drink. You may be told to: Eat and drink fewer calories each day. Limit how much salt (sodium) you use to 1,500 milligrams (mg) each day. Use only healthy fats for cooking, such as olive oil, canola oil, and sunflower oil. Eat healthy foods. To do this: Choose foods that are high in fiber. These include whole grains, and fresh fruits and vegetables. Eat at least 5 servings of fruits and vegetables a day. Try to fill one-half of your plate with fruits and vegetables at each meal. Choose low-fat (lean) proteins. These include low-fat cuts of meat, chicken without skin, fish, tofu, beans, and nuts. Eat low-fat dairy products. Avoid foods that: Are high in salt. Have saturated fat. Have trans fat. Have cholesterol. Are processed or pre-made. Count how many carbohydrates you eat and drink each day. Lifestyle If you drink alcohol: Limit how much you have to: 0-1 drink a day for women who are not pregnant. 0-2 drinks a day for men. Know how much alcohol is in your drink. In the [place] one drink equals one 12 oz bottle of beer ( ), one 5 oz glass of wine ( ), or one 1 oz glass of hard liquor (44mL). Do not smoke or use any products that have nicotine or tobacco. If you need help quitting, ask your doctor. Avoid secondhand smoke. Do not use drugs. Activity  Try to stay at a healthy weight. Get at least 30 minutes of exercise on most days, such as: Fast walking. Biking. Swimming. Medicines Take over-the-counter and prescription medicines only as told by your  doctor. Avoid taking birth control pills. Talk to your doctor about the risks of taking birth control pills if: You are over 29 years old. You smoke. You get very bad headaches. You have had a blood clot. Where to find more information American Stroke Association: www.strokeassociation.org Get help right away if: You or a loved one has any signs of a stroke. "BE FAST" is an easy way to remember the warning signs: B - Balance. Dizziness, sudden trouble walking, or loss of balance. E - Eyes. Trouble seeing or a change in how you see. F - Face. Sudden weakness or loss of feeling of the face. The face or eyelid may droop on one side. A - Arms. Weakness or loss of feeling in an arm. This happens all of a sudden and most often on one side of the body. S - Speech. Sudden trouble speaking, slurred speech, or trouble understanding what people say. T - Time. Time to call emergency services. Write down what time symptoms started. You or a loved one has other signs of a stroke, such as: A sudden, very bad headache with no known cause. Feeling like you may vomit (nausea). Vomiting. A seizure. These symptoms may be an emergency. Get help right away. Call your local emergency services (911 in the [place] Do not wait to see if the symptoms will go away. Do not drive yourself to the hospital. Summary You can help to prevent a stroke by eating healthy, exercising, and not smoking. It also helps to manage any health problems you have. Do not smoke or use any products that contain nicotine or tobacco. Get help right away if you or a loved one has any signs of a stroke. This information is not intended to replace advice given to you by your health care provider. Make sure you discuss any questions you have with your health care provider. Document Revised: 09/12/2022 Document Reviewed: 09/12/2022  Elsevier Patient Education  2024 Elsevier Inc.      Edwina Barth, MD [facility]

## 2023-06-07 NOTE — Progress Notes (Signed)
[facility] Summary Report / Loop Recorder 

## 2023-06-22 LAB — CUP PACEART REMOTE DEVICE CHECK
Date Time Interrogation Session: 20240828230141
Implantable Pulse Generator Implant Date: 20230131

## 2023-06-27 ENCOUNTER — Ambulatory Visit (INDEPENDENT_AMBULATORY_CARE_PROVIDER_SITE_OTHER): Payer: Medicare Other

## 2023-06-27 DIAGNOSIS — I639 Cerebral infarction, unspecified: Secondary | ICD-10-CM | POA: Diagnosis not present

## 2023-07-05 NOTE — Progress Notes (Signed)
[facility] Summary Report / Loop Recorder 

## 2023-07-25 LAB — CUP PACEART REMOTE DEVICE CHECK
Date Time Interrogation Session: 20240930230909
Implantable Pulse Generator Implant Date: 20230131

## 2023-07-28 ENCOUNTER — Other Ambulatory Visit: Payer: Self-pay | Admitting: Emergency Medicine

## 2023-07-28 DIAGNOSIS — Z1212 Encounter for screening for malignant neoplasm of rectum: Secondary | ICD-10-CM

## 2023-07-28 DIAGNOSIS — Z1211 Encounter for screening for malignant neoplasm of colon: Secondary | ICD-10-CM

## 2023-07-31 ENCOUNTER — Ambulatory Visit (INDEPENDENT_AMBULATORY_CARE_PROVIDER_SITE_OTHER): Payer: Medicare Other

## 2023-07-31 DIAGNOSIS — I639 Cerebral infarction, unspecified: Secondary | ICD-10-CM

## 2023-08-14 NOTE — Progress Notes (Signed)
[facility] Summary Report / Loop Recorder 

## 2023-08-30 LAB — CUP PACEART REMOTE DEVICE CHECK
Date Time Interrogation Session: 20241102230428
Implantable Pulse Generator Implant Date: 20230131

## 2023-09-04 ENCOUNTER — Ambulatory Visit: Payer: Medicare Other

## 2023-09-04 DIAGNOSIS — I639 Cerebral infarction, unspecified: Secondary | ICD-10-CM

## 2023-09-15 ENCOUNTER — Other Ambulatory Visit: Payer: Self-pay | Admitting: Emergency Medicine

## 2023-09-15 DIAGNOSIS — I10 Essential (primary) hypertension: Secondary | ICD-10-CM

## 2023-09-28 NOTE — Progress Notes (Signed)
[facility] Summary Report / Loop Recorder 

## 2023-09-29 ENCOUNTER — Ambulatory Visit (INDEPENDENT_AMBULATORY_CARE_PROVIDER_SITE_OTHER): Payer: Medicare Other

## 2023-09-29 DIAGNOSIS — I639 Cerebral infarction, unspecified: Secondary | ICD-10-CM | POA: Diagnosis not present

## 2023-09-29 LAB — CUP PACEART REMOTE DEVICE CHECK
Date Time Interrogation Session: 20241205230732
Implantable Pulse Generator Implant Date: 20230131

## 2023-10-09 ENCOUNTER — Ambulatory Visit: Payer: Medicare Other

## 2023-10-13 ENCOUNTER — Telehealth: Payer: Self-pay | Admitting: *Deleted

## 2023-10-13 NOTE — Telephone Encounter (Signed)
Left message to call back  

## 2023-10-13 NOTE — Telephone Encounter (Signed)
-----   Message from Will [facility] sent at 09/29/2023  1:04 PM EST ----- Abnormal ILR reviewed. Notable for A-fib with rapid rates.  Start Toprol-XL 50 mg daily.Marland Kitchen

## 2023-10-16 ENCOUNTER — Telehealth: Payer: Self-pay | Admitting: Cardiology

## 2023-10-16 NOTE — Telephone Encounter (Signed)
Left message to call back  

## 2023-10-16 NOTE — Telephone Encounter (Signed)
Pt reports that she is not taking Eliquis -- stated she was very nervous about taking that medication and only took it a few days according to her.  She kept seeing stuff on TV and stopped taking it - made her anxious. Advised pt to start it and discussed rationale.  Pt aware she needs to be seen for more education, the importance of the blood thinner, and discuss new medication MD is recommending she add due to her afib. Aware afib clinic will call her to arrange an appt, pt agreeable to plan.

## 2023-10-16 NOTE — Telephone Encounter (Signed)
See 12/23 telephone note

## 2023-10-16 NOTE — Telephone Encounter (Signed)
Patient returned RN's call. 

## 2023-10-17 NOTE — Telephone Encounter (Signed)
Left message to call back  

## 2023-10-17 NOTE — Telephone Encounter (Signed)
Pt thought I left another message.  Informed that I had not, but that the afib clinic would be in touch to schedule an appt. Patient verbalized understanding and agreeable to plan.

## 2023-10-17 NOTE — Telephone Encounter (Signed)
Patient is returning call to [facility], Charity fundraiser and is requesting return call.

## 2023-11-03 ENCOUNTER — Ambulatory Visit: Payer: Medicare Other

## 2023-11-03 ENCOUNTER — Ambulatory Visit (HOSPITAL_COMMUNITY): Payer: Medicare Other | Admitting: Internal Medicine

## 2023-11-07 ENCOUNTER — Other Ambulatory Visit: Payer: Self-pay | Admitting: Emergency Medicine

## 2023-11-07 DIAGNOSIS — I10 Essential (primary) hypertension: Secondary | ICD-10-CM

## 2023-11-08 ENCOUNTER — Other Ambulatory Visit: Payer: Self-pay | Admitting: Radiology

## 2023-11-08 ENCOUNTER — Encounter: Payer: Self-pay | Admitting: Emergency Medicine

## 2023-11-08 DIAGNOSIS — Z8673 Personal history of transient ischemic attack (TIA), and cerebral infarction without residual deficits: Secondary | ICD-10-CM

## 2023-11-08 MED ORDER — LISINOPRIL-HYDROCHLOROTHIAZIDE 20-12.5 MG PO TABS
2.0000 | ORAL_TABLET | Freq: Every day | ORAL | 0 refills | Status: DC
Start: 2023-11-08 — End: 2024-03-20

## 2023-11-08 MED ORDER — ATORVASTATIN CALCIUM 80 MG PO TABS: 80.0000 mg | ORAL_TABLET | Freq: Every day | ORAL | 2 refills | Status: DC

## 2023-11-13 ENCOUNTER — Ambulatory Visit: Payer: Medicare Other

## 2023-12-08 ENCOUNTER — Ambulatory Visit: Payer: Medicare Other

## 2023-12-11 ENCOUNTER — Ambulatory Visit: Payer: Medicare Other | Admitting: Emergency Medicine

## 2023-12-11 ENCOUNTER — Telehealth: Payer: Self-pay | Admitting: *Deleted

## 2023-12-11 NOTE — Telephone Encounter (Signed)
Received alert from [employer] that all attempts to reach the patient regarding her disconnected device had been exhausted.  Last transmission received was 09/25/2023 (77 days ago).  Called and left messages to call back on patient's home and cell phones.   Called and spoke with patient's son Darlene [name]) who said he would text his mother to let her know we are trying to reach her.

## 2023-12-12 NOTE — Telephone Encounter (Signed)
LMOVM for pt to give device clinic a call back.

## 2023-12-12 NOTE — Telephone Encounter (Signed)
Can we go ahead and send a certified letter since [employer] has made numerous attempts to contact and still unsuccessful?

## 2023-12-13 NOTE — Telephone Encounter (Signed)
Spoke with patient she states she has lost the phone. I have given pt tech support number so she can get them to send her a new one. Pt states she will do it today

## 2023-12-14 NOTE — Telephone Encounter (Signed)
Pt monitor ordered  12/13/2023.

## 2023-12-18 ENCOUNTER — Ambulatory Visit: Payer: Medicare Other

## 2023-12-20 ENCOUNTER — Ambulatory Visit (INDEPENDENT_AMBULATORY_CARE_PROVIDER_SITE_OTHER): Payer: Medicare Other

## 2023-12-20 DIAGNOSIS — I639 Cerebral infarction, unspecified: Secondary | ICD-10-CM | POA: Diagnosis not present

## 2023-12-22 LAB — CUP PACEART REMOTE DEVICE CHECK
Date Time Interrogation Session: 20250227004616
Implantable Pulse Generator Implant Date: 20230131
Zone Setting Status: 755011
Zone Setting Status: 755011

## 2024-01-12 ENCOUNTER — Ambulatory Visit: Payer: Medicare Other

## 2024-01-12 ENCOUNTER — Telehealth: Payer: Self-pay | Admitting: *Deleted

## 2024-01-12 NOTE — Telephone Encounter (Signed)
-----   Message from Will [facility] sent at 12/24/2023  3:16 PM EST ----- Abnormal ILR reviewed. Notable for rapid AF. Start toprol xl 50 mg dailiy.

## 2024-01-12 NOTE — Telephone Encounter (Signed)
 Left message to call back

## 2024-01-17 ENCOUNTER — Ambulatory Visit: Payer: Medicare Other

## 2024-01-22 ENCOUNTER — Ambulatory Visit: Payer: Medicare Other

## 2024-01-23 NOTE — Progress Notes (Signed)
 [facility] Summary Report / Loop Recorder

## 2024-01-23 NOTE — Addendum Note (Signed)
 Addended by: Elease Etienne A on: 01/23/2024 01:14 PM   Modules accepted: Orders

## 2024-01-24 ENCOUNTER — Ambulatory Visit: Payer: Medicare Other

## 2024-01-24 DIAGNOSIS — I639 Cerebral infarction, unspecified: Secondary | ICD-10-CM

## 2024-01-24 LAB — CUP PACEART REMOTE DEVICE CHECK
Date Time Interrogation Session: 20250401230702
Implantable Pulse Generator Implant Date: 20230131

## 2024-02-05 ENCOUNTER — Encounter: Payer: Self-pay | Admitting: Emergency Medicine

## 2024-02-07 NOTE — Progress Notes (Signed)
 [facility] Summary Report / Loop Recorder

## 2024-02-08 NOTE — Telephone Encounter (Signed)
 Last office visit with me 06/06/2023.  Needs office visit.  Thanks.

## 2024-02-13 ENCOUNTER — Encounter: Payer: Self-pay | Admitting: Emergency Medicine

## 2024-02-13 ENCOUNTER — Ambulatory Visit: Admitting: Emergency Medicine

## 2024-02-13 ENCOUNTER — Ambulatory Visit (INDEPENDENT_AMBULATORY_CARE_PROVIDER_SITE_OTHER): Admitting: Emergency Medicine

## 2024-02-13 VITALS — BP 124/88 | HR 63 | Temp 98.7°F | Ht 65.5 in | Wt 197.0 lb

## 2024-02-13 DIAGNOSIS — M1711 Unilateral primary osteoarthritis, right knee: Secondary | ICD-10-CM | POA: Insufficient documentation

## 2024-02-13 DIAGNOSIS — N1831 Chronic kidney disease, stage 3a: Secondary | ICD-10-CM

## 2024-02-13 DIAGNOSIS — I10 Essential (primary) hypertension: Secondary | ICD-10-CM

## 2024-02-13 DIAGNOSIS — G8929 Other chronic pain: Secondary | ICD-10-CM

## 2024-02-13 DIAGNOSIS — M25561 Pain in right knee: Secondary | ICD-10-CM | POA: Diagnosis not present

## 2024-02-13 DIAGNOSIS — I480 Paroxysmal atrial fibrillation: Secondary | ICD-10-CM

## 2024-02-13 DIAGNOSIS — Z8673 Personal history of transient ischemic attack (TIA), and cerebral infarction without residual deficits: Secondary | ICD-10-CM | POA: Diagnosis not present

## 2024-02-13 DIAGNOSIS — Z1211 Encounter for screening for malignant neoplasm of colon: Secondary | ICD-10-CM

## 2024-02-13 DIAGNOSIS — Z1231 Encounter for screening mammogram for malignant neoplasm of breast: Secondary | ICD-10-CM

## 2024-02-13 MED ORDER — EMPAGLIFLOZIN 10 MG PO TABS: 10.0000 mg | ORAL_TABLET | Freq: Every day | ORAL | 5 refills | Status: AC

## 2024-02-13 NOTE — Assessment & Plan Note (Signed)
 Currently on normal sinus rhythm with controlled ventricular rate. Not taking Eliquis .  Afraid of side effects Explained benefits of anticoagulation and need to avoid second stroke Encouraged to start taking Eliquis  5 mg twice a day Diet and nutrition discussed.  Avoidance of NSAIDs discussed. Fall precautions given.

## 2024-02-13 NOTE — Assessment & Plan Note (Signed)
 Accounting for chronic right knee pain X-ray images done today reviewed. Recommend orthopedic evaluation Referral placed today.

## 2024-02-13 NOTE — Progress Notes (Signed)
 Darlene [name] 73 y.o.   Chief Complaint  Patient presents with   Leg Pain    Patient states having pain right knee pain. She states walking hurts the most, she was only using a pain cream to help but no other medications. She believes it may have been from a previous back in December 2023 started with the left leg but now is the right     HISTORY OF PRESENT ILLNESS: This is a 73 y.o. female complaining of pain to right knee for the past several weeks  Denies injury. Also follow-up of chronic medical conditions including hypertension and chronic kidney disease  Leg Pain      Prior to Admission medications   Medication Sig Start Date End Date Taking? Authorizing Provider  atorvastatin  (LIPITOR ) 80 MG tablet Take 1 tablet (80 mg total) by mouth daily. 11/08/23  Yes Kimi Kroft, Isidro Margo, MD  lisinopril -hydrochlorothiazide  (ZESTORETIC ) 20-12.5 MG tablet Take 2 tablets by mouth daily. 11/08/23  Yes Uno Esau, Isidro Margo, MD  apixaban  (ELIQUIS ) 5 MG TABS tablet Take 1 tablet (5 mg total) by mouth 2 (two) times daily. Patient not taking: Reported on 02/13/2024 05/03/22   Valeda Garter R, PA    No Known Allergies  Patient Active Problem List   Diagnosis Date Noted   Stage 3a chronic kidney disease (HCC) 06/06/2023   Chronic pain of left knee 06/06/2023   Paroxysmal atrial fibrillation (HCC) 05/03/2022   Secondary hypercoagulable state (HCC) 05/03/2022   History of stroke 11/16/2021   Cerebral thrombosis with cerebral infarction 10/10/2021   HTN (hypertension) 10/10/2021   HTN (hypertension), malignant 10/09/2021   Essential hypertension 08/17/2018    Past Medical History:  Diagnosis Date   Hypertension     History reviewed. No pertinent surgical history.  Social History   Socioeconomic History   Marital status: Married    Spouse name: Not on file   Number of children: Not on file   Years of education: Not on file   Highest education level: Not on file  Occupational  History   Not on file  Tobacco Use   Smoking status: Never   Smokeless tobacco: Never   Tobacco comments:    Never smoke 05/03/22  Substance and Sexual Activity   Alcohol use: No   Drug use: No   Sexual activity: Never  Other Topics Concern   Not on file  Social History Narrative   Not on file   Social Drivers of Health   Financial Resource Strain: Not on file  Food Insecurity: Not on file  Transportation Needs: Not on file  Physical Activity: Not on file  Stress: Not on file  Social Connections: Not on file  Intimate Partner Violence: Not on file    Family History  Problem Relation Age of Onset   Diabetes Mother        90s   Cancer Sister      Review of Systems  Constitutional: Negative.  Negative for chills and fever.  HENT: Negative.  Negative for congestion and sore throat.   Respiratory: Negative.  Negative for cough and shortness of breath.   Cardiovascular: Negative.  Negative for chest pain and palpitations.  Gastrointestinal:  Negative for abdominal pain, diarrhea, nausea and vomiting.  Skin: Negative.  Negative for rash.  Neurological: Negative.  Negative for dizziness and headaches.  All other systems reviewed and are negative.   Today's Vitals   02/13/24 1256  BP: 124/88  Pulse: 63  Temp: 98.7 F (37.1 C)  TempSrc: Oral  SpO2: 96%  Weight: 197 lb (89.4 kg)  Height: 5' 5.5" (1.664 m)   Body mass index is 32.28 kg/m.   Physical Exam Vitals reviewed.  Constitutional:      Appearance: Normal appearance.  HENT:     Head: Normocephalic.     Mouth/Throat:     Mouth: Mucous membranes are moist.     Pharynx: Oropharynx is clear.  Eyes:     Extraocular Movements: Extraocular movements intact.     Pupils: Pupils are equal, round, and reactive to light.  Cardiovascular:     Rate and Rhythm: Normal rate and regular rhythm.     Pulses: Normal pulses.     Heart sounds: Normal heart sounds.  Pulmonary:     Effort: Pulmonary effort is normal.      Breath sounds: Normal breath sounds.  Musculoskeletal:     Cervical back: No tenderness.     Comments: Right knee: No erythema or swelling.  No significant tenderness.  Full range of motion.  Lymphadenopathy:     Cervical: No cervical adenopathy.  Skin:    General: Skin is warm and dry.  Neurological:     General: No focal deficit present.     Mental Status: She is alert and oriented to person, place, and time.  Psychiatric:        Mood and Affect: Mood normal.        Behavior: Behavior normal.      ASSESSMENT & PLAN: A total of 42 minutes was spent with the patient and counseling/coordination of care regarding preparing for this visit, review of most recent office visit notes, review of multiple chronic medical conditions and their management, review of all medications, review of most recent bloodwork results, review of health maintenance items, education on nutrition, prognosis, documentation, and need for follow up.   Problem List Items Addressed This Visit       Cardiovascular and Mediastinum   Essential hypertension   BP Readings from Last 3 Encounters:  02/13/24 124/88  06/06/23 (!) 158/92  02/27/23 118/76  Well-controlled hypertension Continue Zestoretic  20-12.5 mg daily Cardiovascular risks associated with hypertension discussed       Paroxysmal atrial fibrillation (HCC)   Currently on normal sinus rhythm with controlled ventricular rate. Not taking Eliquis .  Afraid of side effects Explained benefits of anticoagulation and need to avoid second stroke Encouraged to start taking Eliquis  5 mg twice a day Diet and nutrition discussed.  Avoidance of NSAIDs discussed. Fall precautions given.        Musculoskeletal and Integument   Primary osteoarthritis of right knee   Accounting for chronic right knee pain X-ray images done today reviewed. Recommend orthopedic evaluation Referral placed today.      Relevant Orders   CBC with Differential/Platelet    Comprehensive metabolic panel with GFR   Lipid panel   Ambulatory referral to Orthopedic Surgery     Genitourinary   Stage 3a chronic kidney disease (HCC)   Advised to stay well-hydrated and avoid NSAIDs Blood work done today Recommend to start Jardiance  10 mg daily      Relevant Medications   empagliflozin  (JARDIANCE ) 10 MG TABS tablet   Other Relevant Orders   CBC with Differential/Platelet   Lipid panel     Other   History of stroke   History of stroke in 2022 Secondary stroke prevention measures discussed Continues atorvastatin  80 mg daily Blood pressure not well-controlled due to noncompliance Not taking Eliquis .  Afraid of side  effects      Chronic pain of right knee - Primary   Chronic pain affecting quality of life Pain management discussed.  Advised to avoid NSAIDs Okay to take Tylenol  We will do x-ray today and refer to orthopedist      Relevant Orders   DG Knee 1-2 Views Right   Ambulatory referral to Orthopedic Surgery   Other Visit Diagnoses       Screening for colon cancer       Relevant Orders   Cologuard     Screening mammogram for breast cancer       Relevant Orders   MM Digital Screening        Patient Instructions  Acute Knee Pain, Adult Many things can cause knee pain. Sometimes, knee pain is sudden (acute). It may be caused by damage, swelling, or irritation of the muscles and tissues that support your knee. Pain may come from: A fall. An injury to the knee from twisting motions. A hit to the knee. Infection. The pain often goes away on its own with time and rest. If the pain does not go away, tests may be done to find out what is causing the pain. These may include: Imaging tests, such as an X-ray, MRI, CT scan, or ultrasound. Joint aspiration. In this test, fluid is removed from the knee and checked. Arthroscopy. In this test, a lighted tube is put in the knee and an image is shown on a screen. A biopsy. In this test, a health care  provider will remove a small piece of tissue for testing. Follow these instructions at home: If you have a knee sleeve or brace that can be taken off:  Wear the knee sleeve or brace as told by your provider. Take it off only if your provider says that you can. Check the skin around it every day. Tell your provider if you see problems. Loosen the knee sleeve or brace if your toes tingle, are numb, or turn cold and blue. Keep the knee sleeve or brace clean and dry. Bathing If the knee sleeve or brace is not waterproof: Do not let it get wet. Cover it when you take a bath or shower. Use a cover that does not let any water in. Managing pain, stiffness, and swelling  If told, put ice on the area. If you have a knee sleeve or brace that you can take off, remove it as told. Put ice in a plastic bag. Place a towel between your skin and the bag. Leave the ice on for 20 minutes, 2-3 times a day. If your skin turns bright red, take off the ice right away to prevent skin damage. The risk of damage is higher if you cannot feel pain, heat, or cold. Move your toes often to reduce stiffness and swelling. Raise the injured area above the level of your heart while you are sitting or lying down. Use a pillow to support your foot as needed. If told, use an elastic bandage to put pressure (compression) on your injured knee. This may control swelling, give support, and help with discomfort. Sleep with a pillow under your knee. Activity Rest your knee. Do not do things that cause pain or make pain worse. Do not stand or walk on your injured knee until you're told it's okay. Use crutches as told. Avoid activities where both feet leave the ground at the same time and put stress on the joints. Avoid running, jumping rope, and doing jumping jacks. Work  with a physical therapist to make a safe exercise program if told. Physical therapy helps your knee move better and get stronger. Exercise as told. General  instructions Take your medicines only as told by your provider. If you are overweight, work with your provider and an expert in healthy eating, called a dietician, to set goals to lose weight. Being overweight can make your knee hurt more. Do not smoke, vape, or use products with nicotine or tobacco in them. If you need help quitting, talk with your provider. Return to normal activities when you are told. Ask what things are safe for you to do. Watch for any changes in your symptoms. Keep all follow-up visits. Your provider will check your healing and adjust treatments if needed. Contact a health care provider if: The knee pain does not stop. The knee pain changes or gets worse. You have a fever along with knee pain. Your knee is red or feels warm when you touch it. Your knee gives out or locks up. Get help right away if: Your knee swells and the swelling gets worse. You cannot move your knee. You have very bad knee pain that does not get better with medicine. This information is not intended to replace advice given to you by your health care provider. Make sure you discuss any questions you have with your health care provider. Document Revised: 07/13/2023 Document Reviewed: 12/05/2022 Elsevier Patient Education  2024 [employer].    Maryagnes Small, MD [facility]

## 2024-02-13 NOTE — Assessment & Plan Note (Signed)
 Advised to stay well-hydrated and avoid NSAIDs Blood work done today Recommend to start Jardiance  10 mg daily

## 2024-02-13 NOTE — Assessment & Plan Note (Signed)
 BP Readings from Last 3 Encounters:  02/13/24 124/88  06/06/23 (!) 158/92  02/27/23 118/76  Well-controlled hypertension Continue Zestoretic  20-12.5 mg daily Cardiovascular risks associated with hypertension discussed

## 2024-02-13 NOTE — Assessment & Plan Note (Signed)
 Chronic pain affecting quality of life Pain management discussed.  Advised to avoid NSAIDs Okay to take Tylenol  We will do x-ray today and refer to orthopedist

## 2024-02-13 NOTE — Patient Instructions (Signed)
 Acute Knee Pain, Adult Many things can cause knee pain. Sometimes, knee pain is sudden (acute). It may be caused by damage, swelling, or irritation of the muscles and tissues that support your knee. Pain may come from: A fall. An injury to the knee from twisting motions. A hit to the knee. Infection. The pain often goes away on its own with time and rest. If the pain does not go away, tests may be done to find out what is causing the pain. These may include: Imaging tests, such as an X-ray, MRI, CT scan, or ultrasound. Joint aspiration. In this test, fluid is removed from the knee and checked. Arthroscopy. In this test, a lighted tube is put in the knee and an image is shown on a screen. A biopsy. In this test, a health care provider will remove a small piece of tissue for testing. Follow these instructions at home: If you have a knee sleeve or brace that can be taken off:  Wear the knee sleeve or brace as told by your provider. Take it off only if your provider says that you can. Check the skin around it every day. Tell your provider if you see problems. Loosen the knee sleeve or brace if your toes tingle, are numb, or turn cold and blue. Keep the knee sleeve or brace clean and dry. Bathing If the knee sleeve or brace is not waterproof: Do not let it get wet. Cover it when you take a bath or shower. Use a cover that does not let any water in. Managing pain, stiffness, and swelling  If told, put ice on the area. If you have a knee sleeve or brace that you can take off, remove it as told. Put ice in a plastic bag. Place a towel between your skin and the bag. Leave the ice on for 20 minutes, 2-3 times a day. If your skin turns bright red, take off the ice right away to prevent skin damage. The risk of damage is higher if you cannot feel pain, heat, or cold. Move your toes often to reduce stiffness and swelling. Raise the injured area above the level of your heart while you are sitting  or lying down. Use a pillow to support your foot as needed. If told, use an elastic bandage to put pressure (compression) on your injured knee. This may control swelling, give support, and help with discomfort. Sleep with a pillow under your knee. Activity Rest your knee. Do not do things that cause pain or make pain worse. Do not stand or walk on your injured knee until you're told it's okay. Use crutches as told. Avoid activities where both feet leave the ground at the same time and put stress on the joints. Avoid running, jumping rope, and doing jumping jacks. Work with a physical therapist to make a safe exercise program if told. Physical therapy helps your knee move better and get stronger. Exercise as told. General instructions Take your medicines only as told by your provider. If you are overweight, work with your provider and an expert in healthy eating, called a dietician, to set goals to lose weight. Being overweight can make your knee hurt more. Do not smoke, vape, or use products with nicotine or tobacco in them. If you need help quitting, talk with your provider. Return to normal activities when you are told. Ask what things are safe for you to do. Watch for any changes in your symptoms. Keep all follow-up visits. Your provider will check  your healing and adjust treatments if needed. Contact a health care provider if: The knee pain does not stop. The knee pain changes or gets worse. You have a fever along with knee pain. Your knee is red or feels warm when you touch it. Your knee gives out or locks up. Get help right away if: Your knee swells and the swelling gets worse. You cannot move your knee. You have very bad knee pain that does not get better with medicine. This information is not intended to replace advice given to you by your health care provider. Make sure you discuss any questions you have with your health care provider. Document Revised: 07/13/2023 Document  Reviewed: 12/05/2022 Elsevier Patient Education  2024 [employer].

## 2024-02-13 NOTE — Assessment & Plan Note (Signed)
History of stroke in 2022 Secondary stroke prevention measures discussed Continues atorvastatin 80 mg daily Blood pressure not well-controlled due to noncompliance Not taking Eliquis.  Afraid of side effects

## 2024-02-14 ENCOUNTER — Telehealth: Payer: Self-pay | Admitting: Emergency Medicine

## 2024-02-14 NOTE — Telephone Encounter (Unsigned)
 Copied from CRM [id number]. Topic: General - Other >> Feb 14, 2024 11:07 AM Annelle Kiel wrote: Reason for CRM: patient daughter is needing a call back regarding patient work excuse letter

## 2024-02-15 ENCOUNTER — Encounter: Payer: Self-pay | Admitting: Radiology

## 2024-02-15 NOTE — Telephone Encounter (Signed)
 Copied from CRM [id number]. Topic: General - Inquiry >> Feb 15, 2024  8:36 AM [place] wrote: Reason for CRM: patient daughter calling about note for work. She is asking for it to say that she can go back to work on Monday 4/29 and also the accommodations needing to be changed so that she is able to sit for work. She works at [employer] at [employer]. Please contact Patient daughter as soon as possible [place] [phone]

## 2024-02-16 ENCOUNTER — Ambulatory Visit: Payer: Medicare Other

## 2024-02-20 ENCOUNTER — Encounter: Payer: Self-pay | Admitting: Radiology

## 2024-02-21 ENCOUNTER — Ambulatory Visit: Payer: Medicare Other

## 2024-02-26 ENCOUNTER — Ambulatory Visit: Payer: Medicare Other

## 2024-02-28 ENCOUNTER — Ambulatory Visit (INDEPENDENT_AMBULATORY_CARE_PROVIDER_SITE_OTHER): Payer: Medicare Other

## 2024-02-28 DIAGNOSIS — I639 Cerebral infarction, unspecified: Secondary | ICD-10-CM

## 2024-02-28 LAB — CUP PACEART REMOTE DEVICE CHECK
Date Time Interrogation Session: 20250506231641
Implantable Pulse Generator Implant Date: 20230131

## 2024-03-07 NOTE — Telephone Encounter (Signed)
 Spoke with patients daughter and informed her is it still being worked on and will notify her when completed and signed

## 2024-03-20 ENCOUNTER — Other Ambulatory Visit: Payer: Self-pay | Admitting: Emergency Medicine

## 2024-03-20 DIAGNOSIS — I10 Essential (primary) hypertension: Secondary | ICD-10-CM

## 2024-03-21 NOTE — Telephone Encounter (Signed)
 Adjusting FMLA will notify pt when completed and faxed

## 2024-03-22 ENCOUNTER — Ambulatory Visit: Payer: Medicare Other

## 2024-03-27 ENCOUNTER — Ambulatory Visit: Payer: Medicare Other

## 2024-04-03 ENCOUNTER — Ambulatory Visit (INDEPENDENT_AMBULATORY_CARE_PROVIDER_SITE_OTHER): Payer: Medicare Other

## 2024-04-03 DIAGNOSIS — I639 Cerebral infarction, unspecified: Secondary | ICD-10-CM

## 2024-04-04 LAB — CUP PACEART REMOTE DEVICE CHECK
Date Time Interrogation Session: 20250610231405
Implantable Pulse Generator Implant Date: 20230131

## 2024-04-08 NOTE — Progress Notes (Signed)
 [facility] Summary Report / Loop Recorder

## 2024-04-09 ENCOUNTER — Ambulatory Visit: Payer: Self-pay | Admitting: Cardiology

## 2024-04-29 ENCOUNTER — Ambulatory Visit: Payer: Medicare Other

## 2024-05-01 ENCOUNTER — Ambulatory Visit: Payer: Medicare Other

## 2024-05-06 ENCOUNTER — Ambulatory Visit: Payer: Self-pay | Admitting: Cardiology

## 2024-05-06 ENCOUNTER — Ambulatory Visit

## 2024-05-06 DIAGNOSIS — I639 Cerebral infarction, unspecified: Secondary | ICD-10-CM | POA: Diagnosis not present

## 2024-05-06 LAB — CUP PACEART REMOTE DEVICE CHECK
Date Time Interrogation Session: 20250713233523
Implantable Pulse Generator Implant Date: 20230131

## 2024-05-08 ENCOUNTER — Ambulatory Visit: Payer: Medicare Other

## 2024-05-10 ENCOUNTER — Encounter: Payer: Self-pay | Admitting: Advanced Practice Midwife

## 2024-05-29 NOTE — Addendum Note (Signed)
 Addended by: VICCI SELLER A on: 05/29/2024 03:14 PM   Modules accepted: Orders

## 2024-05-29 NOTE — Progress Notes (Signed)
 [facility] Summary Report / Loop Recorder

## 2024-05-31 ENCOUNTER — Ambulatory Visit: Payer: Medicare Other

## 2024-06-05 ENCOUNTER — Ambulatory Visit: Payer: Medicare Other

## 2024-06-06 ENCOUNTER — Ambulatory Visit (INDEPENDENT_AMBULATORY_CARE_PROVIDER_SITE_OTHER)

## 2024-06-06 DIAGNOSIS — I639 Cerebral infarction, unspecified: Secondary | ICD-10-CM | POA: Diagnosis not present

## 2024-06-06 LAB — CUP PACEART REMOTE DEVICE CHECK
Date Time Interrogation Session: 20250813231956
Implantable Pulse Generator Implant Date: 20230131

## 2024-06-08 ENCOUNTER — Ambulatory Visit: Payer: Self-pay | Admitting: Cardiology

## 2024-06-12 ENCOUNTER — Ambulatory Visit: Payer: Medicare Other

## 2024-07-05 ENCOUNTER — Ambulatory Visit: Payer: Medicare Other

## 2024-07-08 ENCOUNTER — Ambulatory Visit (INDEPENDENT_AMBULATORY_CARE_PROVIDER_SITE_OTHER)

## 2024-07-08 DIAGNOSIS — I639 Cerebral infarction, unspecified: Secondary | ICD-10-CM | POA: Diagnosis not present

## 2024-07-09 LAB — CUP PACEART REMOTE DEVICE CHECK
Date Time Interrogation Session: 20250914233427
Implantable Pulse Generator Implant Date: 20230131

## 2024-07-10 ENCOUNTER — Ambulatory Visit: Payer: Medicare Other

## 2024-07-15 ENCOUNTER — Ambulatory Visit: Payer: Self-pay | Admitting: Cardiology

## 2024-07-15 NOTE — Progress Notes (Signed)
 Remote Loop Recorder Transmission

## 2024-07-16 ENCOUNTER — Telehealth: Payer: Self-pay

## 2024-07-16 NOTE — Telephone Encounter (Signed)
-----   Message from Eastman H sent at 07/16/2024  1:49 PM EDT ----- Regarding: RE: Overdue to see EP MD/APP I've spoke w/ patient or her daughter 3 times now. The last time I spoke with the daughter, she was irritated with me calling.   -9.18.2025 12:30pm call x2; spoke with patients daughter, she states that she is waiting to hear back from patient with a date, and then will have to schedule through her brother (pts son), advised I will just wait to hear back ch  -9.16.2025 9:29am patient returned my call - advised I have 9/23 at 8am/245 - she states she will get back in touch with me d/t needing to get a ride ch  -9.15.2025 10:07am call x1; spoke w/ patients daughter to schedule patient w/ EP APP for yearly f/u - AF on monitor, daughter states she will need to call back after speaking with patient regarding her work schedule and talking with her brother (patients son), my direct dial given to patient ch  Cassie ----- Message ----- From: Prentiss Almarie DASEN, RN Sent: 07/04/2024   8:59 PM EDT To: Charlott CHRISTELLA Hurst Subject: RE: Overdue to see EP MD/APP                   They should follow up with [facility] since they are overdue for their yearly apt. ----- Message ----- From: [place]st Charlott CHRISTELLA Sent: 07/04/2024   4:40 PM EDT To: Almarie DASEN Prentiss, RN Subject: RE: Overdue to see EP MD/APP                   From [facility] dispo on 02/2023 visit, he said to follow up with [facility] in 1 yr. Should he see them or us ? ----- Message ----- From: Prentiss Almarie DASEN, RN Sent: 07/02/2024   8:44 AM EDT To: C[facility]Ep Scheduling Subject: Overdue to see EP MD/APP                       Pt is overdue for apt and needs to be seen for AF.   Thanks Leigh

## 2024-07-17 ENCOUNTER — Ambulatory Visit: Payer: Medicare Other

## 2024-07-18 NOTE — Progress Notes (Signed)
 Remote Loop Recorder Transmission

## 2024-07-31 NOTE — Progress Notes (Signed)
 Remote Loop Recorder Transmission

## 2024-08-08 ENCOUNTER — Ambulatory Visit

## 2024-08-08 ENCOUNTER — Ambulatory Visit (INDEPENDENT_AMBULATORY_CARE_PROVIDER_SITE_OTHER)

## 2024-08-08 DIAGNOSIS — I639 Cerebral infarction, unspecified: Secondary | ICD-10-CM | POA: Diagnosis not present

## 2024-08-08 LAB — CUP PACEART REMOTE DEVICE CHECK
Date Time Interrogation Session: 20251015232256
Implantable Pulse Generator Implant Date: 20230131

## 2024-08-09 ENCOUNTER — Ambulatory Visit: Payer: Medicare Other

## 2024-08-14 ENCOUNTER — Ambulatory Visit: Payer: Medicare Other

## 2024-08-14 NOTE — Progress Notes (Signed)
 Remote Loop Recorder Transmission

## 2024-08-16 ENCOUNTER — Ambulatory Visit: Payer: Self-pay | Admitting: Cardiology

## 2024-08-21 ENCOUNTER — Encounter: Payer: Self-pay | Admitting: Emergency Medicine

## 2024-08-21 ENCOUNTER — Ambulatory Visit: Payer: Medicare Other

## 2024-09-08 ENCOUNTER — Ambulatory Visit

## 2024-09-09 ENCOUNTER — Ambulatory Visit

## 2024-09-11 ENCOUNTER — Ambulatory Visit: Attending: Internal Medicine

## 2024-09-11 DIAGNOSIS — I639 Cerebral infarction, unspecified: Secondary | ICD-10-CM | POA: Diagnosis not present

## 2024-09-11 LAB — CUP PACEART REMOTE DEVICE CHECK
Date Time Interrogation Session: 20251118233048
Implantable Pulse Generator Implant Date: 20230131

## 2024-09-12 ENCOUNTER — Ambulatory Visit: Payer: Self-pay | Admitting: Cardiology

## 2024-09-13 ENCOUNTER — Ambulatory Visit: Payer: Medicare Other

## 2024-09-13 NOTE — Progress Notes (Signed)
 Remote Loop Recorder Transmission

## 2024-09-18 ENCOUNTER — Ambulatory Visit: Payer: Medicare Other

## 2024-09-25 ENCOUNTER — Ambulatory Visit: Payer: Medicare Other

## 2024-10-09 ENCOUNTER — Ambulatory Visit

## 2024-10-12 ENCOUNTER — Ambulatory Visit: Attending: Internal Medicine

## 2024-10-12 DIAGNOSIS — I639 Cerebral infarction, unspecified: Secondary | ICD-10-CM | POA: Diagnosis not present

## 2024-10-13 LAB — CUP PACEART REMOTE DEVICE CHECK
Date Time Interrogation Session: 20251219231953
Implantable Pulse Generator Implant Date: 20230131

## 2024-10-14 NOTE — Progress Notes (Signed)
 Remote Loop Recorder Transmission

## 2024-10-18 ENCOUNTER — Ambulatory Visit: Payer: Self-pay | Admitting: Cardiology

## 2024-10-18 ENCOUNTER — Ambulatory Visit: Payer: Medicare Other

## 2024-10-23 ENCOUNTER — Ambulatory Visit: Payer: Medicare Other

## 2024-10-27 ENCOUNTER — Encounter: Payer: Self-pay | Admitting: Emergency Medicine

## 2024-10-28 ENCOUNTER — Other Ambulatory Visit: Payer: Self-pay

## 2024-10-28 DIAGNOSIS — I10 Essential (primary) hypertension: Secondary | ICD-10-CM

## 2024-10-28 DIAGNOSIS — Z8673 Personal history of transient ischemic attack (TIA), and cerebral infarction without residual deficits: Secondary | ICD-10-CM

## 2024-10-28 MED ORDER — ATORVASTATIN CALCIUM 80 MG PO TABS: 80.0000 mg | ORAL_TABLET | Freq: Every day | ORAL | 1 refills | Status: AC

## 2024-10-28 MED ORDER — LISINOPRIL-HYDROCHLOROTHIAZIDE 20-12.5 MG PO TABS: 2.0000 | ORAL_TABLET | Freq: Every day | ORAL | 1 refills | Status: AC

## 2024-11-09 ENCOUNTER — Ambulatory Visit

## 2024-11-12 ENCOUNTER — Ambulatory Visit: Attending: Internal Medicine

## 2024-12-13 ENCOUNTER — Ambulatory Visit

## 2025-01-13 ENCOUNTER — Ambulatory Visit

## 2025-02-13 ENCOUNTER — Ambulatory Visit

## 2025-03-14 ENCOUNTER — Ambulatory Visit

## 2025-03-16 ENCOUNTER — Ambulatory Visit

## 2025-04-16 ENCOUNTER — Ambulatory Visit

## 2025-05-17 ENCOUNTER — Ambulatory Visit

## 2025-06-13 ENCOUNTER — Ambulatory Visit

## 2025-06-17 ENCOUNTER — Ambulatory Visit

## 2025-07-18 ENCOUNTER — Ambulatory Visit

## 2025-08-18 ENCOUNTER — Ambulatory Visit

## 2025-09-12 ENCOUNTER — Ambulatory Visit

## 2025-09-18 ENCOUNTER — Ambulatory Visit

## 2025-12-12 ENCOUNTER — Ambulatory Visit

## 2026-03-13 ENCOUNTER — Ambulatory Visit

## 2026-06-12 ENCOUNTER — Ambulatory Visit

## 2026-09-11 ENCOUNTER — Ambulatory Visit

## 2026-12-11 ENCOUNTER — Ambulatory Visit

## 2027-03-12 ENCOUNTER — Ambulatory Visit
